# Patient Record
Sex: Female | Born: 1955 | Race: White | Hispanic: No | Smoking: Never smoker
Health system: Southern US, Community
[De-identification: ages and names within clinical notes are randomized; demographics above are authoritative.]

## PROBLEM LIST (undated history)

## (undated) DIAGNOSIS — H269 Unspecified cataract: Secondary | ICD-10-CM

## (undated) DIAGNOSIS — K649 Unspecified hemorrhoids: Secondary | ICD-10-CM

## (undated) DIAGNOSIS — T7840XA Allergy, unspecified, initial encounter: Secondary | ICD-10-CM

## (undated) DIAGNOSIS — F32A Depression, unspecified: Secondary | ICD-10-CM

## (undated) DIAGNOSIS — K219 Gastro-esophageal reflux disease without esophagitis: Secondary | ICD-10-CM

## (undated) DIAGNOSIS — E785 Hyperlipidemia, unspecified: Secondary | ICD-10-CM

## (undated) HISTORY — DX: Hyperlipidemia, unspecified: E78.5

## (undated) HISTORY — PX: FINGER SURGERY: SHX640

## (undated) HISTORY — PX: EYE SURGERY: SHX253

## (undated) HISTORY — DX: Unspecified hemorrhoids: K64.9

## (undated) HISTORY — PX: ABDOMINAL HYSTERECTOMY: SHX81

## (undated) HISTORY — PX: JOINT REPLACEMENT: SHX530

## (undated) HISTORY — PX: APPENDECTOMY: SHX54

## (undated) HISTORY — PX: TUBAL LIGATION: SHX77

## (undated) HISTORY — PX: CHOLECYSTECTOMY: SHX55

## (undated) HISTORY — PX: GALLBLADDER SURGERY: SHX652

## (undated) HISTORY — DX: Unspecified cataract: H26.9

## (undated) HISTORY — PX: TONSILLECTOMY: SUR1361

## (undated) HISTORY — DX: Allergy, unspecified, initial encounter: T78.40XA

---

## 2017-05-18 DIAGNOSIS — Z86010 Personal history of colonic polyps: Secondary | ICD-10-CM | POA: Insufficient documentation

## 2017-05-18 DIAGNOSIS — K2100 Gastro-esophageal reflux disease with esophagitis, without bleeding: Secondary | ICD-10-CM | POA: Insufficient documentation

## 2017-09-28 DIAGNOSIS — M25562 Pain in left knee: Secondary | ICD-10-CM | POA: Insufficient documentation

## 2019-12-13 DIAGNOSIS — Z780 Asymptomatic menopausal state: Secondary | ICD-10-CM | POA: Insufficient documentation

## 2020-01-02 DIAGNOSIS — R7303 Prediabetes: Secondary | ICD-10-CM | POA: Insufficient documentation

## 2020-01-02 DIAGNOSIS — E785 Hyperlipidemia, unspecified: Secondary | ICD-10-CM | POA: Insufficient documentation

## 2020-04-26 DIAGNOSIS — M8580 Other specified disorders of bone density and structure, unspecified site: Secondary | ICD-10-CM | POA: Insufficient documentation

## 2020-04-26 DIAGNOSIS — F439 Reaction to severe stress, unspecified: Secondary | ICD-10-CM | POA: Insufficient documentation

## 2020-09-06 DIAGNOSIS — B370 Candidal stomatitis: Secondary | ICD-10-CM | POA: Insufficient documentation

## 2020-09-06 DIAGNOSIS — J029 Acute pharyngitis, unspecified: Secondary | ICD-10-CM | POA: Insufficient documentation

## 2020-09-06 DIAGNOSIS — H6592 Unspecified nonsuppurative otitis media, left ear: Secondary | ICD-10-CM | POA: Insufficient documentation

## 2021-06-05 ENCOUNTER — Ambulatory Visit (INDEPENDENT_AMBULATORY_CARE_PROVIDER_SITE_OTHER): Payer: Medicare Other | Admitting: Medical-Surgical

## 2021-06-05 ENCOUNTER — Other Ambulatory Visit: Payer: Self-pay

## 2021-06-05 ENCOUNTER — Ambulatory Visit: Payer: Self-pay | Admitting: Medical-Surgical

## 2021-06-05 ENCOUNTER — Encounter: Payer: Self-pay | Admitting: Medical-Surgical

## 2021-06-05 VITALS — BP 132/79 | HR 77 | Resp 20 | Ht 67.0 in | Wt 200.0 lb

## 2021-06-05 DIAGNOSIS — F32A Depression, unspecified: Secondary | ICD-10-CM | POA: Insufficient documentation

## 2021-06-05 DIAGNOSIS — R400 Somnolence: Secondary | ICD-10-CM | POA: Diagnosis not present

## 2021-06-05 DIAGNOSIS — Z7689 Persons encountering health services in other specified circumstances: Secondary | ICD-10-CM | POA: Diagnosis not present

## 2021-06-05 DIAGNOSIS — Z131 Encounter for screening for diabetes mellitus: Secondary | ICD-10-CM

## 2021-06-05 DIAGNOSIS — E7801 Familial hypercholesterolemia: Secondary | ICD-10-CM

## 2021-06-05 DIAGNOSIS — M8580 Other specified disorders of bone density and structure, unspecified site: Secondary | ICD-10-CM

## 2021-06-05 DIAGNOSIS — F324 Major depressive disorder, single episode, in partial remission: Secondary | ICD-10-CM

## 2021-06-05 DIAGNOSIS — K219 Gastro-esophageal reflux disease without esophagitis: Secondary | ICD-10-CM

## 2021-06-05 DIAGNOSIS — Z1329 Encounter for screening for other suspected endocrine disorder: Secondary | ICD-10-CM

## 2021-06-05 DIAGNOSIS — R0683 Snoring: Secondary | ICD-10-CM

## 2021-06-05 MED ORDER — PANTOPRAZOLE SODIUM 40 MG PO TBEC: 40.0000 mg | DELAYED_RELEASE_TABLET | Freq: Every day | ORAL | 3 refills | Status: DC

## 2021-06-05 MED ORDER — DESVENLAFAXINE SUCCINATE ER 100 MG PO TB24: 100.0000 mg | ORAL_TABLET | Freq: Every day | ORAL | 3 refills | Status: DC

## 2021-06-05 MED ORDER — CETIRIZINE HCL 10 MG PO TABS: 10.0000 mg | ORAL_TABLET | Freq: Every day | ORAL | 3 refills | Status: DC

## 2021-06-05 MED ORDER — PRAVASTATIN SODIUM 20 MG PO TABS: 20.0000 mg | ORAL_TABLET | Freq: Every evening | ORAL | 3 refills | Status: DC

## 2021-06-05 NOTE — Progress Notes (Signed)
New Patient Office Visit  Subjective:  Patient ID: Rhonda Munoz, adult    DOB: Sep 22, 1955  Age: 65 y.o. MRN: [id number]  CC:  Chief Complaint  Patient presents with   Establish Care    HPI Rhonda Munoz presents to establish care.  Mood- taking Pristiq 100mg  daily, tolerating well without side effects. Been on this several years. Doing counseling but will start with a new counselor in a couple of weeks. 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 Living in a small 2 bedroom apartment but working on buying a townhouse.   GERD- taking Protonix 40mg  daily, tolerating well without side effects. Has a resurgence of symptoms if she doesn't take it.   Cholesterol- strong family history of high cholesterol and heart disease. Last couple of readings were borderline. Started Pravastatin 20mg  daily, tolerating well without side effects. Due for recheck.  Osteopenia- taking Calcium with vitamin D. Staying active. Last DEXA 2021.   History reviewed. No pertinent past medical history.  History reviewed. No pertinent surgical history.  History reviewed. No pertinent family history.  Social History   Socioeconomic History   Marital status: Married    Spouse name: Not on file   Number of children: Not on file   Years of education: Not on file   Highest education level: Not on file  Occupational History   Not on file  Tobacco Use   Smoking status: Not on file   Smokeless tobacco: Not on file  Substance and Sexual Activity   Alcohol use: Not on file   Drug use: Not on file   Sexual activity: Not on file  Other Topics Concern   Not on file  Social History Narrative   Not on file   Social Determinants of Health   Financial Resource Strain: Not on file  Food Insecurity: Not on file  Transportation Needs: Not on file  Physical Activity: Not on file  Stress: Not on file  Social Connections: Not on file   Intimate Partner Violence: Not on file    ROS Review of Systems  Constitutional:  Negative for chills, fatigue, fever and unexpected weight change.  Eyes:  Negative for visual disturbance.  Respiratory:  Negative for cough, chest tightness, shortness of breath and wheezing.   Cardiovascular:  Negative for chest pain, palpitations and leg swelling.  Gastrointestinal:  Negative for abdominal pain, constipation, diarrhea, nausea and vomiting.  Genitourinary:  Negative for dysuria, frequency and urgency.  Skin:  Negative for rash.  Neurological:  Negative for dizziness, light-headedness and headaches.  Psychiatric/Behavioral:  Positive for dysphoric mood and sleep disturbance. Negative for self-injury and suicidal ideas. The patient is nervous/anxious.    Objective:   Today's Vitals: BP 132/79 (BP Location: Left Arm, Patient Position: Sitting, Cuff Size: Normal)   Pulse 77   Resp 20   Ht 5\' 7"  (1.702 m)   Wt 200 lb (90.7 kg)   SpO2 99%   BMI 31.32 kg/m   Physical Exam Vitals and nursing note reviewed.  Constitutional:      General: She is not in acute distress.    Appearance: Normal appearance. She is obese. She is not ill-appearing.  HENT:     Head: Normocephalic and atraumatic.  Cardiovascular:     Rate and Rhythm: Normal rate and regular rhythm.     Pulses: Normal pulses.     Heart sounds: Normal heart sounds. No murmur heard.  No friction rub. No gallop.  Pulmonary:     Effort: Pulmonary effort is normal. No respiratory distress.     Breath sounds: Normal breath sounds.  Skin:    General: Skin is warm and dry.  Neurological:     Mental Status: She is alert and oriented to person, place, and time.  Psychiatric:        Mood and Affect: Mood normal.        Behavior: Behavior normal.        Thought Content: Thought content normal.        Judgment: Judgment normal.    Assessment & Plan:   1. Encounter to establish care Reviewed available information and discussed  care concerns with patient.   2. Gastroesophageal reflux disease without esophagitis Checking CBC with differential.  Continue Protonix 40 mg daily. - CBC with Differential/Platelet  3. Daytime sleepiness Daytime sleepiness may be related to sleep apnea versus stress versus post viral fatigue.  Checking CBC.  Home sleep test to evaluate for OSA - Home sleep test - CBC with Differential/Platelet  4. Major depressive disorder with single episode, in partial remission (HCC) Continue Pristiq 100 mg daily.  5. Osteopenia, unspecified location Continue vitamin D and calcium supplementation.  DEXA scan due next year.  6. Familial hypercholesterolemia Checking CMP and lipid panel.  Continue pravastatin. - COMPLETE METABOLIC PANEL WITH GFR - Lipid panel  7. Loud snoring Home sleep study. - Home sleep test  8. Thyroid disorder screen Checking TSH. - TSH  9. Diabetes mellitus screening Checking hemoglobin A1c. - Hemoglobin A1c  Outpatient Encounter Medications as of 06/05/2021  Medication Sig   aspirin 81 MG EC tablet Take 81 mg by mouth daily.   Calcium Carbonate-Vitamin D (CALCIUM 600 +D HIGH POTENCY PO) Take 600 mg by mouth daily.   [DISCONTINUED] cetirizine (ZYRTEC) 10 MG tablet Take 1 tablet by mouth daily.   [DISCONTINUED] desvenlafaxine (PRISTIQ) 100 MG 24 hr tablet Take 100 mg by mouth daily.   [DISCONTINUED] pantoprazole (PROTONIX) 40 MG tablet Take 1 tablet by mouth daily.   [DISCONTINUED] pravastatin (PRAVACHOL) 20 MG tablet Take 1 tablet by mouth every evening.   cetirizine (ZYRTEC) 10 MG tablet Take 1 tablet (10 mg total) by mouth daily.   desvenlafaxine (PRISTIQ) 100 MG 24 hr tablet Take 1 tablet (100 mg total) by mouth daily.   pantoprazole (PROTONIX) 40 MG tablet Take 1 tablet (40 mg total) by mouth daily.   pravastatin (PRAVACHOL) 20 MG tablet Take 1 tablet (20 mg total) by mouth every evening.   No facility-administered encounter medications on file as of  06/05/2021.    Follow-up: Return in about 6 months (around 12/04/2021) for chronic disease follow up.   Thayer Ohm, DNP, APRN, FNP-BC [facility]

## 2021-06-19 ENCOUNTER — Ambulatory Visit: Payer: Medicare Other

## 2021-07-01 ENCOUNTER — Ambulatory Visit (INDEPENDENT_AMBULATORY_CARE_PROVIDER_SITE_OTHER): Payer: Medicare Other | Admitting: Medical-Surgical

## 2021-07-01 ENCOUNTER — Other Ambulatory Visit: Payer: Self-pay

## 2021-07-01 DIAGNOSIS — Z23 Encounter for immunization: Secondary | ICD-10-CM | POA: Diagnosis not present

## 2021-07-10 ENCOUNTER — Telehealth: Payer: Self-pay | Admitting: Medical-Surgical

## 2021-07-10 ENCOUNTER — Ambulatory Visit: Payer: Medicare Other | Admitting: Physician Assistant

## 2021-07-10 DIAGNOSIS — R300 Dysuria: Secondary | ICD-10-CM

## 2021-07-10 NOTE — Telephone Encounter (Signed)
Ok to take off since she called.

## 2021-07-10 NOTE — Telephone Encounter (Signed)
Patient called at 8:01am to cancel appointment, woke up with bad vertigo. Didn't know rather to leave her on the schedule or cancel her off?

## 2021-07-11 ENCOUNTER — Telehealth (INDEPENDENT_AMBULATORY_CARE_PROVIDER_SITE_OTHER): Payer: Medicare Other | Admitting: Family Medicine

## 2021-07-11 DIAGNOSIS — N3000 Acute cystitis without hematuria: Secondary | ICD-10-CM

## 2021-07-11 MED ORDER — FLUCONAZOLE 150 MG PO TABS: 150.0000 mg | ORAL_TABLET | Freq: Once | ORAL | 0 refills | Status: AC

## 2021-07-11 MED ORDER — SULFAMETHOXAZOLE-TRIMETHOPRIM 800-160 MG PO TABS: 1.0000 | ORAL_TABLET | Freq: Two times a day (BID) | ORAL | 0 refills | Status: DC

## 2021-07-11 NOTE — Progress Notes (Signed)
    Virtual Visit via Video Note  I connected with Rhonda Munoz on 07/11/21 at 11:10 AM EST by a video enabled telemedicine application and verified that I am speaking with the correct person using two identifiers.   I discussed the limitations of evaluation and management by telemedicine and the availability of in person appointments. The patient expressed understanding and agreed to proceed.  Patient location: at home Provider location: in office  Subjective:    CC:  No chief complaint on file.   HPI: Sxs started about 4 days ago of pelvic pressure, no severe.  Having urgency and pressure at the same time.  No burning with urinary  Hx of complete hysterecty. No recent ABX. No recent catheter use.  . No hx of frequent UTI   Past medical history, Surgical history, Family history not pertinant except as noted below, Social history, Allergies, and medications have been entered into the medical record, reviewed, and corrections made.    Objective:    General: Speaking clearly in complete sentences without any shortness of breath.  Alert and oriented x3.  Normal judgment. No apparent acute distress.    Impression and Recommendations:    Problem List Items Addressed This Visit   None   No orders of the defined types were placed in this encounter.   No orders of the defined types were placed in this encounter.    I discussed the assessment and treatment plan with the patient. The patient was provided an opportunity to ask questions and all were answered. The patient agreed with the plan and demonstrated an understanding of the instructions.   The patient was advised to call back or seek an in-person evaluation if the symptoms worsen or if the condition fails to improve as anticipated.   Nani Gasser, MD

## 2021-07-11 NOTE — Progress Notes (Signed)
    Virtual Visit via Video Note  I connected with [name] on 07/11/21 at 11:10 AM EST by a video enabled telemedicine application and verified that I am speaking with the correct person using two identifiers.   I discussed the limitations of evaluation and management by telemedicine and the availability of in person appointments. The patient expressed understanding and agreed to proceed.  Patient location: at home Provider location: in office  Subjective:    CC:   Chief Complaint  Patient presents with   Urinary Tract Infection     HPI: Sxs started about 4 days ago of pelvic pressure, no severe.  Having urgency and pressure at the same time.  No burning with urinary  Hx of complete hysterecty. No recent ABX. No recent catheter use.  . No hx of frequent UTI. No blood in the urine.  Used the AZO test strip and looked positive.  No nausea or back pain.    Past medical history, Surgical history, Family history not pertinant except as noted below, Social history, Allergies, and medications have been entered into the medical record, reviewed, and corrections made.    Objective:    General: Speaking clearly in complete sentences without any shortness of breath.  Alert and oriented x3.  Normal judgment. No apparent acute distress.    Impression and Recommendations:    Problem List Items Addressed This Visit   None Visit Diagnoses     Acute cystitis without hematuria    -  Primary       No orders of the defined types were placed in this encounter.   Will tx with Bactrim DS based on sxs.  Call to be seen if not better after the weekend. She is low risk for more complicated UTI.  Sent over diflucan as well. She will use her acidophilis as she is very prone to yeast after coarse of ABX.   Meds ordered this encounter  Medications   sulfamethoxazole-trimethoprim (BACTRIM DS) 800-160 MG tablet    Sig: Take 1 tablet by mouth 2 (two) times daily.    Dispense:  6 tablet     Refill:  0   fluconazole (DIFLUCAN) 150 MG tablet    Sig: Take 1 tablet (150 mg total) by mouth once for 1 dose.    Dispense:  1 tablet    Refill:  0     I discussed the assessment and treatment plan with the patient. The patient was provided an opportunity to ask questions and all were answered. The patient agreed with the plan and demonstrated an understanding of the instructions.   The patient was advised to call back or seek an in-person evaluation if the symptoms worsen or if the condition fails to improve as anticipated.   Nani Gasser, MD

## 2021-07-11 NOTE — Progress Notes (Signed)
Sxs since Monday. She reports pain in lower abd w/urination and odor urgency, no blood. She has tested with the AZO it shows leuks/nitrites

## 2021-07-12 ENCOUNTER — Ambulatory Visit: Payer: Self-pay | Admitting: Medical-Surgical

## 2021-08-06 LAB — CBC WITH DIFFERENTIAL/PLATELET
Absolute Monocytes: 461 cells/uL (ref 200–950)
Basophils Absolute: 47 cells/uL (ref 0–200)
Basophils Relative: 0.5 %
Eosinophils Absolute: 461 cells/uL (ref 15–500)
Eosinophils Relative: 4.9 %
HCT: 40.5 % (ref 38.5–50.0)
Hemoglobin: 13.1 g/dL — ABNORMAL LOW (ref 13.2–17.1)
Lymphs Abs: 2688 cells/uL (ref 850–3900)
MCH: 28 pg (ref 27.0–33.0)
MCHC: 32.3 g/dL (ref 32.0–36.0)
MCV: 86.5 fL (ref 80.0–100.0)
MPV: 10.5 fL (ref 7.5–12.5)
Monocytes Relative: 4.9 %
Neutro Abs: 5743 cells/uL (ref 1500–7800)
Neutrophils Relative %: 61.1 %
Platelets: 334 10*3/uL (ref 140–400)
RBC: 4.68 10*6/uL (ref 4.20–5.80)
RDW: 13.1 % (ref 11.0–15.0)
Total Lymphocyte: 28.6 %
WBC: 9.4 10*3/uL (ref 3.8–10.8)

## 2021-08-06 LAB — COMPLETE METABOLIC PANEL WITH GFR
AG Ratio: 1.4 (calc) (ref 1.0–2.5)
ALT: 21 U/L (ref 9–46)
AST: 19 U/L (ref 10–35)
Albumin: 4.4 g/dL (ref 3.6–5.1)
Alkaline phosphatase (APISO): 86 U/L (ref 35–144)
BUN: 10 mg/dL (ref 7–25)
CO2: 27 mmol/L (ref 20–32)
Calcium: 9.7 mg/dL (ref 8.6–10.3)
Chloride: 101 mmol/L (ref 98–110)
Creat: 0.7 mg/dL (ref 0.70–1.35)
Globulin: 3.2 g/dL (calc) (ref 1.9–3.7)
Glucose, Bld: 91 mg/dL (ref 65–99)
Potassium: 4.5 mmol/L (ref 3.5–5.3)
Sodium: 140 mmol/L (ref 135–146)
Total Bilirubin: 0.4 mg/dL (ref 0.2–1.2)
Total Protein: 7.6 g/dL (ref 6.1–8.1)
eGFR: 104 mL/min/{1.73_m2} (ref >=60)

## 2021-08-06 LAB — LIPID PANEL
Cholesterol: 182 mg/dL (ref <200)
HDL: 56 mg/dL (ref >=40)
LDL Cholesterol (Calc): 99 mg/dL (calc)
Non-HDL Cholesterol (Calc): 126 mg/dL (calc) (ref <130)
Total CHOL/HDL Ratio: 3.3 (calc) (ref <5.0)
Triglycerides: 174 mg/dL — ABNORMAL HIGH (ref <150)

## 2021-08-06 LAB — HEMOGLOBIN A1C
Hgb A1c MFr Bld: 6.1 % of total Hgb — ABNORMAL HIGH (ref <5.7)
Mean Plasma Glucose: 128 mg/dL
eAG (mmol/L): 7.1 mmol/L

## 2021-08-06 LAB — TSH: TSH: 1.99 mIU/L (ref 0.40–4.50)

## 2021-08-12 ENCOUNTER — Encounter: Payer: Self-pay | Admitting: Family Medicine

## 2021-08-12 ENCOUNTER — Telehealth (INDEPENDENT_AMBULATORY_CARE_PROVIDER_SITE_OTHER): Payer: Medicare Other | Admitting: Family Medicine

## 2021-08-12 VITALS — Temp 100.8°F | Wt 194.0 lb

## 2021-08-12 DIAGNOSIS — U071 COVID-19: Secondary | ICD-10-CM

## 2021-08-12 MED ORDER — NIRMATRELVIR/RITONAVIR (PAXLOVID)TABLET: ORAL_TABLET | ORAL | 0 refills | Status: DC

## 2021-08-12 NOTE — Progress Notes (Signed)
Virtual Video Visit via MyChart Note  I connected with  [name] on 08/12/21 at  2:20 PM EST by the video enabled telemedicine application for MyChart, and verified that I am speaking with the correct person using two identifiers.   I introduced myself as a Publishing rights manager with the practice. We discussed the limitations of evaluation and management by telemedicine and the availability of in person appointments. The patient expressed understanding and agreed to proceed.  Participating parties in this visit include: The patient and the nurse practitioner listed.  The patient is: At home I am: In the office - [facility]  Subjective:    CC:  Chief Complaint  Patient presents with   Covid Positive    Body aches, drainage, headaches. Taking OTC pain medications.    HPI: Rhonda Munoz is a 65 y.o. year old female presenting today via MyChart today for COVID infection.   Two days ago patient started with rhinorrhea that has now progressed to nasal congestion, headache, sinus pressure, fever/chills, generalized body aches, fatigue. She had a positive COVID test today and states she feels horrible. She has gotten minimal relief with Coricidin, Tylenol, and hot showers. She denies any chest pain, shortness of breath, pain with breathing, wheezing, nausea, vomiting, diarrhea.      Past medical history, Surgical history, Family history not pertinant except as noted below, Social history, Allergies, and medications have been entered into the medical record, reviewed, and corrections made.   Review of Systems:  All review of systems negative except what is listed in the HPI   Objective:    General:  Speaking clearly in complete sentences. Absent shortness of breath noted.   Alert and oriented x3.   Normal judgment.  Absent acute distress.   Impression and Recommendations:    1. COVID-19 - nirmatrelvir/ritonavir EUA (PAXLOVID) 20 x 150 MG & 10 x 100MG  TABS; 1  dose p.o. twice daily for 5 days, may use any available formulation at pharmacy with the same total dosage.  Dispense: 30 tablet; Refill: 0  Discussed options with patient. She would like to try Paxlovid. Education sheet link attached to AVS. Educated her to hold her statin while on this medicine. Adding list of OTC medications and quarantine guidelines to AVS. Patient aware of signs/symptoms requiring further/urgent evaluation.    Follow-up if symptoms worsen or fail to improve.    I discussed the assessment and treatment plan with the patient. The patient was provided an opportunity to ask questions and all were answered. The patient agreed with the plan and demonstrated an understanding of the instructions.   The patient was advised to call back or seek an in-person evaluation if the symptoms worsen or if the condition fails to improve as anticipated.  I spent 20 minutes dedicated to the care of this patient on the date of this encounter to include pre-visit chart review of prior notes and results, face-to-face time with the patient, and post-visit ordering of testing as indicated.   , NP

## 2021-08-12 NOTE — Patient Instructions (Addendum)
Paxlovid patient fact sheet: http://galloway.com/  Skip your cholesterol medicine while on the paxlovid.   Over the counter medications that may be helpful for symptoms:  Guaifenesin 1200 mg extended release tabs twice daily, with plenty of water For cough and congestion Brand name: Mucinex   Pseudoephedrine 30 mg, one or two tabs every 4 to 6 hours For sinus congestion Brand name: Sudafed You must get this from the pharmacy counter.  Oxymetazoline nasal spray each morning, one spray in each nostril, for NO MORE THAN 3 days  For nasal and sinus congestion Brand name: Afrin Saline nasal spray or Saline Nasal Irrigation 3-5 times a day For nasal and sinus congestion Brand names: Ocean or AYR Fluticasone nasal spray, one spray in each nostril, each morning (after oxymetazoline and saline, if used) For nasal and sinus congestion Brand name: Flonase Warm salt water gargles  For sore throat Every few hours as needed Alternate ibuprofen 400-600 mg and acetaminophen 1000 mg every 4-6 hours For fever, body aches, headache Brand names: Motrin or Advil and Tylenol Dextromethorphan 12-hour cough version 30 mg every 12 hours  For cough Brand name: Delsym Stop all other cold medications for now (Nyquil, Dayquil, Tylenol Cold, Theraflu, etc) and other non-prescription cough/cold preparations. Many of these have the same ingredients listed above and could cause an overdose of medication.   Herbal treatments that have been shown to be helpful in some patients include: Vitamin C 1000mg  per day Vitamin D 4000iU per day Zinc 100mg  per day Quercetin 25-500mg  twice a day Melatonin 5-10mg  at bedtime  General Instructions Allow your body to rest Drink PLENTY of fluids Isolate yourself from everyone, even family, until test results have returned  If your COVID-19 test is positive Then you ARE INFECTED and you can pass the virus to others You must quarantine from others for  a minimum of  5 days since symptoms started AND You are fever free for 24 hours WITHOUT any medication to reduce fever AND Your symptoms are improving Wear mask for the next 5 days If not improved by day 5, quarantine for the full 10 days. Do not go to the store or other public areas Do not go around household members who are not known to be infected with COVID-19 If you MUST leave your area of quarantine (example: go to a bathroom you share with others in your home), you must Wear a mask Wash your hands thoroughly Wipe down any surfaces you touch Do not share food, drinks, towels, or other items with other persons Dispose of your own tissues, food containers, etc  Once you have recovered, please continue good preventive care measures, including:  wearing a mask when in public wash your hands frequently avoid touching your face/nose/eyes cover coughs/sneezes with the inside of your elbow stay out of crowds keep a 6 foot distance from others  If you develop severe shortness of breath, uncontrolled fevers, coughing up blood, confusion, chest pain, or signs of dehydration (such as significantly decreased urine amounts or dizziness with standing) please go to the ER.

## 2021-10-12 ENCOUNTER — Other Ambulatory Visit: Payer: Self-pay

## 2021-10-12 ENCOUNTER — Emergency Department
Admission: RE | Admit: 2021-10-12 | Discharge: 2021-10-12 | Disposition: A | Discharge status: Home / Self Care | Payer: Medicare Other | Source: Ambulatory Visit

## 2021-10-12 VITALS — BP 148/88 | HR 73 | Temp 98.3°F | Resp 18 | Ht 67.0 in | Wt 200.0 lb

## 2021-10-12 DIAGNOSIS — J0101 Acute recurrent maxillary sinusitis: Secondary | ICD-10-CM

## 2021-10-12 MED ORDER — AMOXICILLIN-POT CLAVULANATE 875-125 MG PO TABS: 1.0000 | ORAL_TABLET | Freq: Two times a day (BID) | ORAL | 0 refills | Status: DC

## 2021-10-12 MED ORDER — FLUCONAZOLE 150 MG PO TABS: 150.0000 mg | ORAL_TABLET | Freq: Every day | ORAL | 0 refills | Status: DC

## 2021-10-12 MED ORDER — PREDNISONE 20 MG PO TABS: 20.0000 mg | ORAL_TABLET | Freq: Two times a day (BID) | ORAL | 0 refills | Status: DC

## 2021-10-12 NOTE — Discharge Instructions (Signed)
Take the Augmentin 2 times a day with food Take a probiotic while you are on the Augmentin to prevent side effects I have prescribed Diflucan in case she needed Take the prednisone 2 times a day for 5 days. Take the Augmentin and the prednisone 2 times today, then tomorrow start every 12 hours Increase water intake Salt water or saline sinus rinses recommended

## 2021-10-12 NOTE — ED Triage Notes (Signed)
Pt states that she has some nasal congestion, cough, sneezing,  and facial pain. X10 days  Pt states that she is vaccinated for covid. Pt states that she has had flu vaccine.  Pt states that she has had covid in 07/2021.

## 2021-10-12 NOTE — ED Provider Notes (Signed)
[facility]    CSN: [id number] Arrival date & time: 10/12/21  1055      History   Chief Complaint Chief Complaint  Patient presents with   Nasal Congestion    Nasal congestion, sneezing, and coughing. X10 days    HPI Rhonda Munoz is a 66 y.o. female.   HPI Patient has had nasal congestion, cough, sneezing, and some facial pressure for the last 10 days.  Last 2 days with facial pain is localized to the left side of her face.  Her left forehead and left cheek are very painful, it hurts if she leans over.  She is having yellow sinus drainage.  Some nausea.  Some headache. Patient just recovered from COVID 6 weeks ago.  She is vaccinated  Past Medical History:  Diagnosis Date   Hyperlipidemia     Patient Active Problem List   Diagnosis Date Noted   Depression 06/05/2021   Left non-suppurative otitis media 09/06/2020   Oral candidiasis 09/06/2020   Pharyngitis 09/06/2020   Osteopenia 04/26/2020   Stress at home 04/26/2020   Hypercalcemia 01/02/2020   Hyperlipemia 01/02/2020   Prediabetes 01/02/2020   Postmenopausal 12/13/2019   Knee pain, left 09/28/2017   Gastroesophageal reflux disease with esophagitis 05/18/2017   History of colon polyps 05/18/2017    Past Surgical History:  Procedure Laterality Date   ABDOMINAL HYSTERECTOMY     FINGER SURGERY     GALLBLADDER SURGERY      OB History   No obstetric history on file.      Home Medications    Prior to Admission medications   Medication Sig Start Date End Date Taking? Authorizing Provider  amoxicillin-clavulanate (AUGMENTIN) 875-125 MG tablet Take 1 tablet by mouth every 12 (twelve) hours. 10/12/21  Yes Eustace Moore, MD  aspirin 81 MG EC tablet Take 81 mg by mouth daily.   Yes [provider]  B Complex Vitamins (B COMPLEX-B12 PO) Take by mouth.   Yes [provider]  Calcium Carbonate-Vitamin D (CALCIUM 600 +D HIGH POTENCY PO) Take 600 mg by mouth daily.   Yes [provider]  desvenlafaxine (PRISTIQ) 100 MG 24 hr tablet Take 1 tablet (100 mg total) by mouth daily. 06/05/21  Yes Christen Butter, NP  fluconazole (DIFLUCAN) 150 MG tablet Take 1 tablet (150 mg total) by mouth daily. Repeat in 1 week if needed 10/12/21  Yes Eustace Moore, MD  pantoprazole (PROTONIX) 40 MG tablet Take 1 tablet (40 mg total) by mouth daily. 06/05/21  Yes Christen Butter, NP  pravastatin (PRAVACHOL) 20 MG tablet Take 1 tablet (20 mg total) by mouth every evening. 06/05/21  Yes Christen Butter, NP  predniSONE (DELTASONE) 20 MG tablet Take 1 tablet (20 mg total) by mouth 2 (two) times daily with a meal. 10/12/21  Yes Eustace Moore, MD    Family History Family History  Problem Relation Age of Onset   Cancer Mother    Hypertension Mother    Stroke Mother    Diabetes Father    Heart attack Father     Social History Social History   Tobacco Use   Smoking status: Never   Smokeless tobacco: Never  Vaping Use   Vaping Use: Never used  Substance Use Topics   Alcohol use: Never   Drug use: Never     Allergies   Codeine, Hydromorphone, Procaine, and Venlafaxine   Review of Systems Review of Systems See HPI  Physical Exam Triage Vital Signs ED  Triage Vitals  Enc Vitals Group     BP 10/12/21 1116 (!) 148/88     Pulse Rate 10/12/21 1116 73     Resp 10/12/21 1116 18     Temp 10/12/21 1116 98.3 F (36.8 C)     Temp Source 10/12/21 1116 Oral     SpO2 10/12/21 1116 99 %     Weight 10/12/21 1113 200 lb (90.7 kg)     Height 10/12/21 1113 5\' 7"  (1.702 m)     Head Circumference --      Peak Flow --      Pain Score 10/12/21 1113 0     Pain Loc --      Pain Edu? --      Excl. in GC? --    No data found.  Updated Vital Signs BP (!) 148/88 (BP Location: Right Arm)    Pulse 73    Temp 98.3 F (36.8 C) (Oral)    Resp 18    Ht 5\' 7"  (1.702 m)    Wt 90.7 kg    SpO2 99%    BMI 31.32 kg/m      Physical Exam Constitutional:      General: She is not in acute  distress.    Appearance: Normal appearance. She is well-developed.  HENT:     Head: Normocephalic and atraumatic.     Right Ear: Tympanic membrane and ear canal normal.     Left Ear: Tympanic membrane and ear canal normal.     Nose: Congestion and rhinorrhea present.     Mouth/Throat:     Pharynx: Posterior oropharyngeal erythema present.     Comments: Tenderness in left facial sinuses Eyes:     Conjunctiva/sclera: Conjunctivae normal.     Pupils: Pupils are equal, round, and reactive to light.  Cardiovascular:     Rate and Rhythm: Normal rate and regular rhythm.     Heart sounds: Normal heart sounds.  Pulmonary:     Effort: Pulmonary effort is normal. No respiratory distress.     Breath sounds: Normal breath sounds.  Abdominal:     General: There is no distension.     Palpations: Abdomen is soft.  Musculoskeletal:        General: Normal range of motion.     Cervical back: Normal range of motion.  Lymphadenopathy:     Cervical: Cervical adenopathy present.  Skin:    General: Skin is warm and dry.  Neurological:     General: No focal deficit present.     Mental Status: She is alert.     UC Treatments / Results  Labs (all labs ordered are listed, but only abnormal results are displayed) Labs Reviewed - No data to display  EKG   Radiology No results found.  Procedures Procedures (including critical care time)  Medications Ordered in UC Medications - No data to display  Initial Impression / Assessment and Plan / UC Course  I have reviewed the triage vital signs and the nursing notes.  Pertinent labs & imaging results that were available during my care of the patient were reviewed by me and considered in my medical decision making (see chart for details).   Likely bacterial infection after 10 days of viral, will treat with Augmentin Final Clinical Impressions(s) / UC Diagnoses   Final diagnoses:  Acute recurrent maxillary sinusitis     Discharge  Instructions      Take the Augmentin 2 times a day with food Take a probiotic  while you are on the Augmentin to prevent side effects I have prescribed Diflucan in case she needed Take the prednisone 2 times a day for 5 days. Take the Augmentin and the prednisone 2 times today, then tomorrow start every 12 hours Increase water intake Salt water or saline sinus rinses recommended   ED Prescriptions     Medication Sig Dispense Auth. Provider   amoxicillin-clavulanate (AUGMENTIN) 875-125 MG tablet Take 1 tablet by mouth every 12 (twelve) hours. 14 tablet Eustace Moore, MD   predniSONE (DELTASONE) 20 MG tablet Take 1 tablet (20 mg total) by mouth 2 (two) times daily with a meal. 10 tablet Eustace Moore, MD   fluconazole (DIFLUCAN) 150 MG tablet Take 1 tablet (150 mg total) by mouth daily. Repeat in 1 week if needed 2 tablet Eustace Moore, MD      PDMP not reviewed this encounter.   Eustace Moore, MD 10/12/21 [phone]

## 2021-10-18 ENCOUNTER — Emergency Department (HOSPITAL_BASED_OUTPATIENT_CLINIC_OR_DEPARTMENT_OTHER): Payer: Medicare Other | Admitting: Radiology

## 2021-10-18 ENCOUNTER — Emergency Department (HOSPITAL_BASED_OUTPATIENT_CLINIC_OR_DEPARTMENT_OTHER)
Admission: EM | Admit: 2021-10-18 | Discharge: 2021-10-18 | Disposition: A | Discharge status: Home / Self Care | Payer: Medicare Other | Attending: Emergency Medicine | Admitting: Emergency Medicine

## 2021-10-18 ENCOUNTER — Ambulatory Visit: Payer: Medicare Other | Admitting: Family Medicine

## 2021-10-18 ENCOUNTER — Encounter (HOSPITAL_BASED_OUTPATIENT_CLINIC_OR_DEPARTMENT_OTHER): Payer: Self-pay | Admitting: *Deleted

## 2021-10-18 ENCOUNTER — Other Ambulatory Visit: Payer: Self-pay

## 2021-10-18 DIAGNOSIS — Z7982 Long term (current) use of aspirin: Secondary | ICD-10-CM | POA: Insufficient documentation

## 2021-10-18 DIAGNOSIS — H9201 Otalgia, right ear: Secondary | ICD-10-CM | POA: Insufficient documentation

## 2021-10-18 DIAGNOSIS — J069 Acute upper respiratory infection, unspecified: Secondary | ICD-10-CM | POA: Diagnosis not present

## 2021-10-18 DIAGNOSIS — Z8616 Personal history of COVID-19: Secondary | ICD-10-CM | POA: Diagnosis not present

## 2021-10-18 DIAGNOSIS — Z20822 Contact with and (suspected) exposure to covid-19: Secondary | ICD-10-CM | POA: Diagnosis not present

## 2021-10-18 DIAGNOSIS — R059 Cough, unspecified: Secondary | ICD-10-CM | POA: Diagnosis present

## 2021-10-18 LAB — RESP PANEL BY RT-PCR (FLU A&B, COVID) ARPGX2
Influenza A by PCR: NEGATIVE
Influenza B by PCR: NEGATIVE
SARS Coronavirus 2 by RT PCR: NEGATIVE

## 2021-10-18 MED ORDER — BENZONATATE 100 MG PO CAPS: 100.0000 mg | ORAL_CAPSULE | Freq: Three times a day (TID) | ORAL | 0 refills | Status: DC

## 2021-10-18 MED ORDER — BENZONATATE 100 MG PO CAPS
100.0000 mg | ORAL_CAPSULE | Freq: Once | ORAL | Status: AC
  Administered 2021-10-18: 100 mg via ORAL
  Filled 2021-10-18: qty 1

## 2021-10-18 NOTE — ED Triage Notes (Signed)
Pt began having URI on 2/8 Was seen on 2/18 and placed on augmentin for sinus infection.   Pt having lots of coughing since yesterday and states that she feels like she has croup like coughing.

## 2021-10-18 NOTE — ED Notes (Signed)
ED Provider at bedside. 

## 2021-10-18 NOTE — ED Provider Notes (Signed)
[facility] EMERGENCY DEPT Provider Note   CSN: [id number] Arrival date & time: 10/18/21  1301     History  Chief Complaint  Patient presents with   URI    Kariyah Crest is a 66 y.o. female.  Patient is a 66 year old female with a history of hyperlipidemia who is presenting today with complaint of cough.  Patient reports that she had had URI symptoms with significant sinusitis, drainage from her nose, ear pain last week that had been ongoing for approximately 10 days.  She was seen at urgent care 6 days ago and at that time was given Augmentin.  She never filled the prednisone prescription but reports that she had been doing better and felt like she was getting better until yesterday she started coughing.  Now she has a severe cough that makes it difficult to sleep at night.  She has not noticed any wheezing or had any shortness of breath.  She denies any fever.  Her husband is also ill at this time with similar symptoms.  She has no history of tobacco use or asthma.  She denies any lower extremity swelling.  She did have COVID in December and reported a lot of coughing with that but never had to use an inhaler.  The history is provided by the patient.  URI     Home Medications Prior to Admission medications   Medication Sig Start Date End Date Taking? Authorizing Provider  amoxicillin-clavulanate (AUGMENTIN) 875-125 MG tablet Take 1 tablet by mouth every 12 (twelve) hours. 10/12/21   Eustace Moore, MD  aspirin 81 MG EC tablet Take 81 mg by mouth daily.    [provider]  B Complex Vitamins (B COMPLEX-B12 PO) Take by mouth.    [provider]  Calcium Carbonate-Vitamin D (CALCIUM 600 +D HIGH POTENCY PO) Take 600 mg by mouth daily.    [provider]  desvenlafaxine (PRISTIQ) 100 MG 24 hr tablet Take 1 tablet (100 mg total) by mouth daily. 06/05/21   Christen Butter, NP  fluconazole (DIFLUCAN) 150 MG tablet Take 1 tablet (150 mg total) by  mouth daily. Repeat in 1 week if needed 10/12/21   Eustace Moore, MD  pantoprazole (PROTONIX) 40 MG tablet Take 1 tablet (40 mg total) by mouth daily. 06/05/21   Christen Butter, NP  pravastatin (PRAVACHOL) 20 MG tablet Take 1 tablet (20 mg total) by mouth every evening. 06/05/21   Christen Butter, NP  predniSONE (DELTASONE) 20 MG tablet Take 1 tablet (20 mg total) by mouth 2 (two) times daily with a meal. 10/12/21   Eustace Moore, MD      Allergies    Codeine, Hydromorphone, Procaine, and Venlafaxine    Review of Systems   Review of Systems  Physical Exam Updated Vital Signs BP 125/90 (BP Location: Right Arm)    Pulse (!) 103    Temp 98.9 F (37.2 C)    Resp 18    SpO2 100%  Physical Exam Vitals and nursing note reviewed.  Constitutional:      General: She is not in acute distress.    Appearance: She is well-developed.  HENT:     Head: Normocephalic and atraumatic.     Right Ear: A middle ear effusion is present.     Left Ear: Tympanic membrane normal.     Nose: Nose normal.     Mouth/Throat:     Pharynx: Oropharynx is clear. Uvula midline.  Eyes:     Conjunctiva/sclera: Conjunctivae  normal.     Pupils: Pupils are equal, round, and reactive to light.  Cardiovascular:     Rate and Rhythm: Normal rate and regular rhythm.     Heart sounds: No murmur heard. Pulmonary:     Effort: Pulmonary effort is normal. No respiratory distress.     Breath sounds: Normal breath sounds. No wheezing or rales.  Musculoskeletal:        General: No tenderness. Normal range of motion.     Cervical back: Normal range of motion and neck supple.  Skin:    General: Skin is warm and dry.     Findings: No erythema or rash.  Neurological:     Mental Status: She is alert and oriented to person, place, and time.  Psychiatric:        Behavior: Behavior normal.    ED Results / Procedures / Treatments   Labs (all labs ordered are listed, but only abnormal results are displayed) Labs Reviewed  RESP  PANEL BY RT-PCR (FLU A&B, COVID) ARPGX2    EKG None  Radiology DG Chest 2 View  Result Date: 10/18/2021 CLINICAL DATA:  cough EXAM: CHEST - 2 VIEW COMPARISON:  None. FINDINGS: Subtle linear left basilar opacity. No visible pleural effusions or pneumothorax. Cardiomediastinal silhouette is within normal limits. No evidence of acute osseous abnormality. Degenerative change of the spine. Cholecystectomy clips. IMPRESSION: Subtle linear left basilar opacity, favor atelectasis. Electronically Signed   By: Feliberto Harts M.D.   On: 10/18/2021 13:53    Procedures Procedures    Medications Ordered in ED Medications - No data to display  ED Course/ Medical Decision Making/ A&P                           Medical Decision Making Amount and/or Complexity of Data Reviewed Radiology: ordered.  Risk Prescription drug management.   Patient is a 66 year old female presenting today with cough that started over the last 2 days.  She has had URI type symptoms for over a week now.  She is completing Augmentin which she received last week at an urgent care visit when she was having significant sinusitis type symptoms for 10+ days.  Records of that evaluation were reviewed from external medical records.  Patient is now having a cough but denies any shortness of breath.  She has no prior history of respiratory abnormalities such as asthma or COPD.  She does not use tobacco products.  She reports having COVID in December but even then did not require inhaler.  She is not wheezing here, satting 100% on room air and has no evidence of tachypnea.  I independently viewed and interpreted her chest x-ray which appears normal.  Radiology did report a subtle linear left basilar upon opacity which they feel is atelectasis.  Patient has not had shortness of breath or fever.  Low suspicion for PE or acute coronary issues.  COVID and flu are negative.  Reassured patient with the negative findings.  She will continue  Mucinex and was offered [employer].  Findings discussed with the patient.  No social determinants affecting her care.        Final Clinical Impression(s) / ED Diagnoses Final diagnoses:  Viral upper respiratory tract infection    Rx / DC Orders ED Discharge Orders          Ordered    benzonatate (TESSALON) 100 MG capsule  Every 8 hours  10/18/21 1507              Gwyneth Sprout, MD 10/18/21 1524

## 2021-10-18 NOTE — Discharge Instructions (Signed)
Try honey for the cough and hot tea.  Get some rest.

## 2021-10-18 NOTE — ED Notes (Signed)
EMT-P provided AVS using Teachback Method. Patient verbalizes understanding of Discharge Instructions. Opportunity for Questioning and Answers were provided by EMT-P. Patient Discharged from ED.  ? ?

## 2021-10-21 ENCOUNTER — Telehealth: Payer: Self-pay | Admitting: General Practice

## 2021-10-21 NOTE — Telephone Encounter (Signed)
Transition Care Management Follow-up Telephone Call Date of discharge and from where: 10/18/21 from [facility]  How have you been since you were released from the hospital? Still coughing a lot. 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 She is covid negative.  Any questions or concerns? No  Items Reviewed: Did the pt receive and understand the discharge instructions provided? Yes  Medications obtained and verified? Yes  Other? No  Any new allergies since your discharge? No  Dietary orders reviewed? Yes Do you have support at home? Yes   Home Care and Equipment/Supplies: Were home health services ordered? no  Functional Questionnaire: (I = Independent and D = Dependent) ADLs: I  Bathing/Dressing- I  Meal Prep- I  Eating- I  Maintaining continence- I  Transferring/Ambulation- I  Managing Meds- I  Follow up appointments reviewed:  PCP Hospital f/u appt confirmed? No  Will call back to schedule. Specialist Hospital f/u appt confirmed? No   Are transportation arrangements needed? No  If their condition worsens, is the pt aware to call PCP or go to the Emergency Dept.? Yes Was the patient provided with contact information for the PCP's office or ED? Yes Was to pt encouraged to call back with questions or concerns? Yes

## 2021-10-23 ENCOUNTER — Encounter (HOSPITAL_BASED_OUTPATIENT_CLINIC_OR_DEPARTMENT_OTHER): Payer: Self-pay | Admitting: Pediatrics

## 2021-10-23 ENCOUNTER — Emergency Department (HOSPITAL_BASED_OUTPATIENT_CLINIC_OR_DEPARTMENT_OTHER): Payer: Medicare Other | Admitting: Radiology

## 2021-10-23 ENCOUNTER — Other Ambulatory Visit (HOSPITAL_BASED_OUTPATIENT_CLINIC_OR_DEPARTMENT_OTHER): Payer: Self-pay

## 2021-10-23 ENCOUNTER — Other Ambulatory Visit: Payer: Self-pay

## 2021-10-23 ENCOUNTER — Emergency Department (HOSPITAL_BASED_OUTPATIENT_CLINIC_OR_DEPARTMENT_OTHER)
Admission: EM | Admit: 2021-10-23 | Discharge: 2021-10-23 | Disposition: A | Discharge status: Home / Self Care | Payer: Medicare Other | Attending: Emergency Medicine | Admitting: Emergency Medicine

## 2021-10-23 DIAGNOSIS — Z7982 Long term (current) use of aspirin: Secondary | ICD-10-CM | POA: Diagnosis not present

## 2021-10-23 DIAGNOSIS — Z20822 Contact with and (suspected) exposure to covid-19: Secondary | ICD-10-CM | POA: Insufficient documentation

## 2021-10-23 DIAGNOSIS — R052 Subacute cough: Secondary | ICD-10-CM | POA: Diagnosis not present

## 2021-10-23 DIAGNOSIS — R059 Cough, unspecified: Secondary | ICD-10-CM | POA: Diagnosis present

## 2021-10-23 LAB — RESP PANEL BY RT-PCR (FLU A&B, COVID) ARPGX2
Influenza A by PCR: NEGATIVE
Influenza B by PCR: NEGATIVE
SARS Coronavirus 2 by RT PCR: NEGATIVE

## 2021-10-23 MED ORDER — PREDNISONE 10 MG PO TABS
20.0000 mg | ORAL_TABLET | Freq: Every day | ORAL | 0 refills | Status: DC
Start: 2021-10-23 — End: 2021-12-25
  Filled 2021-10-23: qty 10, 5d supply, fill #0

## 2021-10-23 MED ORDER — AEROCHAMBER PLUS FLO-VU MEDIUM MISC
1.0000 | Freq: Once | Status: AC
  Administered 2021-10-23: 1
  Filled 2021-10-23: qty 1

## 2021-10-23 MED ORDER — PREDNISONE 20 MG PO TABS
40.0000 mg | ORAL_TABLET | Freq: Once | ORAL | Status: AC
  Administered 2021-10-23: 40 mg via ORAL
  Filled 2021-10-23: qty 2

## 2021-10-23 MED ORDER — ALBUTEROL SULFATE HFA 108 (90 BASE) MCG/ACT IN AERS
2.0000 | INHALATION_SPRAY | Freq: Once | RESPIRATORY_TRACT | Status: AC
Start: 2021-10-23 — End: 2021-10-23
  Administered 2021-10-23: 2 via RESPIRATORY_TRACT
  Filled 2021-10-23: qty 6.7

## 2021-10-23 NOTE — ED Triage Notes (Signed)
C/O sinus infection turns to cold since 10/02/2021; was seen at [facility] and was prescribed sinus infection; now c/o cough since the 2/23; negative covid and c xray; and cough has been worsening. ?

## 2021-10-23 NOTE — ED Provider Notes (Signed)
?[facility] EMERG[facility]PT ?Provider Note ? ? ?CSN: [id number] ?Arrival date & time: 10/23/21  1106 ? ?  ? ?History ? ?Chief Complaint  ?Patient presents with  ? Cough  ? ? ?Rhonda Munoz is a 66 y.o. female. ? ?HPI ?66 yo female with cough, sinus infection seen at urgent care and prescribed , now with cough since 2/23. Sinuys infection diagnosed on 2/18 and took augmentin completing a 7 day dose. She was feeling better but increased cough on 2/23.   CXR that day normal.  Covid negative.  Now, with ongoing cough, worsening.  Sob with coughing.  Cough productive clear sputum.  ? ?  ? ?Home Medications ?Prior to Admission medications   ?Medication Sig Start Date End Date Taking? Authorizing Provider  ?predniSONE (DELTASONE) 10 MG tablet Take 2 tablets (20 mg total) by mouth daily. 10/23/21  Yes Margarita Grizzle, MD  ?amoxicillin-clavulanate (AUGMENTIN) 875-125 MG tablet Take 1 tablet by mouth every 12 (twelve) hours. 10/12/21   Eustace Moore, MD  ?aspirin 81 MG EC tablet Take 81 mg by mouth daily.    [provider]  ?B Complex Vitamins (B COMPLEX-B12 PO) Take by mouth.    [provider]  ?benzonatate (TESSALON) 100 MG capsule Take 1 capsule (100 mg total) by mouth every 8 (eight) hours. 10/18/21   Gwyneth Sprout, MD  ?Calcium Carbonate-Vitamin D (CALCIUM 600 +D HIGH POTENCY PO) Take 600 mg by mouth daily.    [provider]  ?desvenlafaxine (PRISTIQ) 100 MG 24 hr tablet Take 1 tablet (100 mg total) by mouth daily. 06/05/21   Christen Butter, NP  ?fluconazole (DIFLUCAN) 150 MG tablet Take 1 tablet (150 mg total) by mouth daily. Repeat in 1 week if needed 10/12/21   Eustace Moore, MD  ?pantoprazole (PROTONIX) 40 MG tablet Take 1 tablet (40 mg total) by mouth daily. 06/05/21   Christen Butter, NP  ?pravastatin (PRAVACHOL) 20 MG tablet Take 1 tablet (20 mg total) by mouth every evening. 06/05/21   Christen Butter, NP  ?   ? ?Allergies    ?Codeine, Hydromorphone, Procaine, and  Venlafaxine   ? ?Review of Systems   ?Review of Systems  ?All other systems reviewed and are negative. ? ?Physical Exam ?Updated Vital Signs ?BP 121/76 (BP Location: Left Arm)   Pulse 85   Temp 99.5 ?F (37.5 ?C)   Resp 18   Ht 1.702 m (5\' 7" )   Wt 90.7 kg   SpO2 98%   BMI 31.32 kg/m?  ?Physical Exam ?Vitals and nursing note reviewed.  ?Constitutional:   ?   Appearance: Normal appearance.  ?HENT:  ?   Head: Normocephalic.  ?   Right Ear: External ear normal.  ?   Left Ear: External ear normal.  ?   Nose: Nose normal.  ?   Mouth/Throat:  ?   Pharynx: Oropharynx is clear.  ?Eyes:  ?   Pupils: Pupils are equal, round, and reactive to light.  ?Cardiovascular:  ?   Rate and Rhythm: Normal rate and regular rhythm.  ?   Pulses: Normal pulses.  ?Pulmonary:  ?   Effort: Pulmonary effort is normal.  ?   Breath sounds: Normal breath sounds.  ?Abdominal:  ?   General: Abdomen is flat. Bowel sounds are normal.  ?   Palpations: Abdomen is soft.  ?Musculoskeletal:     ?   General: Normal range of motion.  ?   Cervical back: Normal range of motion.  ?Skin: ?  General: Skin is warm and dry.  ?Neurological:  ?   General: No focal deficit present.  ?   Mental Status: She is alert.  ?Psychiatric:     ?   Mood and Affect: Mood normal.  ? ? ?ED Results / Procedures / Treatments   ?Labs ?(all labs ordered are listed, but only abnormal results are displayed) ?Labs Reviewed  ?RESP PANEL BY RT-PCR (FLU A&B, COVID) ARPGX2  ? ? ?EKG ?None ? ?Radiology ?DG Chest 2 View ? ?Result Date: 10/23/2021 ?CLINICAL DATA:  Cough. EXAM: CHEST - 2 VIEW COMPARISON:  October 18, 2021. FINDINGS: The heart size and mediastinal contours are within normal limits. Minimal left basilar subsegmental atelectasis or scarring is noted. Right lung is clear. The visualized skeletal structures are unremarkable. IMPRESSION: Minimal left basilar subsegmental atelectasis or scarring. Electronically Signed   By: Lupita Raider M.D.   On: 10/23/2021 12:15    ? ?Procedures ?Procedures  ? ? ?Medications Ordered in ED ?Medications  ?predniSONE (DELTASONE) tablet 40 mg (40 mg Oral Given 10/23/21 1234)  ?albuterol (VENTOLIN HFA) 108 (90 Base) MCG/ACT inhaler 2 puff (2 puffs Inhalation Given 10/23/21 1234)  ?AeroChamber Plus Flo-Vu Medium MISC 1 each (1 each Other Given 10/23/21 1241)  ? ? ?ED Course/ Medical Decision Making/ A&P ?Clinical Course as of 10/23/21 1458  ?Wed Oct 23, 2021  ?1300 Chest x-[place] Pirie reviewed and interpreted with no evidence of acute infiltrate noted ?Radiologist interpretation noted [DR]  ?  ?Clinical Course User Index ?[DR] Margarita Grizzle, MD  ? ?                        ?Medical Decision Making ?67 year old female with recent upper respiratory infection symptoms.  She has ongoing coughing.  She is hemodynamically stable with normal oxygen levels here in the ED.  Chest x-Rhylen Pulido obtained and does not show any evidence of acute infiltrate.  She is given a dose of prednisone here in the ED and albuterol.  This is mainly to help with reactive airway disease and coughing.  There is no apparent wheezing on her exam.  We have discussed pros and cons of both these medicines and she can use them as needed but will stop them if her side effects are worse than her benefits.  Discussed return precautions need for follow-up and she voiced understanding. ? ?Amount and/or Complexity of Data Reviewed ?Radiology: ordered. ? ?Risk ?Prescription drug management. ? ? ? ? ? ? ? ? ? ? ?Final Clinical Impression(s) / ED Diagnoses ?Final diagnoses:  ?Subacute cough  ? ? ?Rx / DC Orders ?ED Discharge Orders   ? ?      Ordered  ?  predniSONE (DELTASONE) 10 MG tablet  Daily       ? 10/23/21 1458  ? ?  ?  ? ?  ? ? ?  ?Margarita Grizzle, MD ?10/23/21 1458 ? ?

## 2021-10-24 ENCOUNTER — Telehealth: Payer: Medicare Other | Admitting: Medical-Surgical

## 2021-10-25 ENCOUNTER — Telehealth: Payer: Self-pay | Admitting: General Practice

## 2021-10-25 NOTE — Telephone Encounter (Signed)
Transition Care Management Follow-up Telephone Call ?Date of discharge and from where: 10/23/21 from [facility] ?How have you been since you were released from the hospital? [place] coughing a lot. Started prednisone for five days. ?Any questions or concerns? No ? ?Items Reviewed: ?Did the pt receive and understand the discharge instructions provided? Yes  ?Medications obtained and verified? No  ?Other? No  ?Any new allergies since your discharge? No  ?Dietary orders reviewed? No ?Do you have support at home? Yes  ? ?Home Care and Equipment/Supplies: ?Were home health services ordered? no ? ?Functional Questionnaire: (I = Independent and D = Dependent) ?ADLs: I ? ?Bathing/Dressing- I ? ?Meal Prep- I ? ?Eating- I ? ?Maintaining continence- I ? ?Transferring/Ambulation- I ? ?Managing Meds- I ? ?Follow up appointments reviewed: ? ?PCP Hospital f/u appt confirmed? Yes  Scheduled to see Christen Butter, NP on 11/08/21 @ 1540. ?Specialist Hospital f/u appt confirmed? No   ?Are transportation arrangements needed? No  ?If their condition worsens, is the pt aware to call PCP or go to the Emergency Dept.? Yes ?Was the patient provided with contact information for the PCP's office or ED? Yes ?Was to pt encouraged to call back with questions or concerns? Yes  ?

## 2021-11-08 ENCOUNTER — Inpatient Hospital Stay: Payer: Medicare Other | Admitting: Medical-Surgical

## 2021-12-06 ENCOUNTER — Ambulatory Visit: Payer: Self-pay | Admitting: Medical-Surgical

## 2021-12-25 ENCOUNTER — Emergency Department (INDEPENDENT_AMBULATORY_CARE_PROVIDER_SITE_OTHER)
Admission: RE | Admit: 2021-12-25 | Discharge: 2021-12-25 | Disposition: A | Discharge status: Home / Self Care | Payer: Medicare Other | Source: Ambulatory Visit

## 2021-12-25 ENCOUNTER — Telehealth: Payer: Self-pay

## 2021-12-25 VITALS — BP 129/78 | HR 85 | Temp 98.7°F | Resp 18

## 2021-12-25 DIAGNOSIS — J209 Acute bronchitis, unspecified: Secondary | ICD-10-CM

## 2021-12-25 DIAGNOSIS — R430 Anosmia: Secondary | ICD-10-CM

## 2021-12-25 LAB — POC SARS CORONAVIRUS 2 AG -  ED: SARS Coronavirus 2 Ag: NEGATIVE

## 2021-12-25 MED ORDER — DEXAMETHASONE 6 MG PO TABS: 6.0000 mg | ORAL_TABLET | Freq: Every day | ORAL | 0 refills | Status: AC

## 2021-12-25 MED ORDER — MONTELUKAST SODIUM 10 MG PO TABS: 10.0000 mg | ORAL_TABLET | Freq: Every day | ORAL | 0 refills | Status: DC

## 2021-12-25 NOTE — ED Provider Notes (Signed)
?[facility] ? ? ? ?CSN: [id number] ?Arrival date & time: 12/25/21  1248 ? ? ?  ? ?History   ?Chief Complaint ?Chief Complaint  ?Patient presents with  ? Cough  ? ? ?HPI ?Rhonda Munoz is a 66 y.o. female.  ? ?Pleasant 66 year old female presents today with concerns of a cough over the past 10 days.  She had reactive airway disease and a cough for most of February in part of March of this year, but states she was finally able to get over it.  Unfortunately however over the past 10 days it has come back.  She states it is very harsh.  It is causing her significant fatigue.  She notes several days ago she did have a fever of 100.2, but that is since resolved.  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  She reports he recently got out of the hospital and she has been in and out of the hospital with him.  Patient reports that 4 days ago she lost her taste and or smell.  Patient personally had COVID in December 2022.  She took Paxlovid without any problems.  She reports that her cough is stuck in her chest, but is able to intermittently get up clear/white sputum.  She does not smoke. ? ? ?Cough ? ?Past Medical History:  ?Diagnosis Date  ? Hyperlipidemia   ? ? ?Patient Active Problem List  ? Diagnosis Date Noted  ? Depression 06/05/2021  ? Left non-suppurative otitis media 09/06/2020  ? Oral candidiasis 09/06/2020  ? Pharyngitis 09/06/2020  ? Osteopenia 04/26/2020  ? Stress at home 04/26/2020  ? Hypercalcemia 01/02/2020  ? Hyperlipemia 01/02/2020  ? Prediabetes 01/02/2020  ? Postmenopausal 12/13/2019  ? Knee pain, left 09/28/2017  ? Gastroesophageal reflux disease with esophagitis 05/18/2017  ? History of colon polyps 05/18/2017  ? ? ?Past Surgical History:  ?Procedure Laterality Date  ? ABDOMINAL HYSTERECTOMY    ? FINGER SURGERY    ? GALLBLADDER SURGERY    ? ? ?OB History   ?No obstetric history on file. ?  ? ? ? ?Home Medications   ? ?Prior to Admission medications    ?Medication Sig Start Date End Date Taking? Authorizing Provider  ?dexamethasone (DECADRON) 6 MG tablet Take 1 tablet (6 mg total) by mouth daily for 5 days. 12/25/21 12/30/21 Yes Harmani Neto L, PA  ?montelukast (SINGULAIR) 10 MG tablet Take 1 tablet (10 mg total) by mouth at bedtime. 12/25/21  Yes Kyiesha Millward L, PA  ?aspirin 81 MG EC tablet Take 81 mg by mouth daily.    [provider]  ?B Complex Vitamins (B COMPLEX-B12 PO) Take by mouth.    [provider]  ?Calcium Carbonate-Vitamin D (CALCIUM 600 +D HIGH POTENCY PO) Take 600 mg by mouth daily.    [provider]  ?desvenlafaxine (PRISTIQ) 100 MG 24 hr tablet Take 1 tablet (100 mg total) by mouth daily. 06/05/21   Christen Butter, NP  ?fluconazole (DIFLUCAN) 150 MG tablet Take 1 tablet (150 mg total) by mouth daily. Repeat in 1 week if needed 10/12/21   Eustace Moore, MD  ?pantoprazole (PROTONIX) 40 MG tablet Take 1 tablet (40 mg total) by mouth daily. 06/05/21   Christen Butter, NP  ?pravastatin (PRAVACHOL) 20 MG tablet Take 1 tablet (20 mg total) by mouth every evening. 06/05/21   Christen Butter, NP  ? ? ?Family History ?Family History  ?Problem Relation Age of Onset  ?  Cancer Mother   ? Hypertension Mother   ? Stroke Mother   ? Diabetes Father   ? Heart attack Father   ? ? ?Social History ?Social History  ? ?Tobacco Use  ? Smoking status: Never  ? Smokeless tobacco: Never  ?Vaping Use  ? Vaping Use: Never used  ?Substance Use Topics  ? Alcohol use: Never  ? Drug use: Never  ? ? ? ?Allergies   ?Codeine, Hydromorphone, Procaine, and Venlafaxine ? ? ?Review of Systems ?Review of Systems  ?Respiratory:  Positive for cough.   ?All other systems reviewed and are negative. ? ? ?Physical Exam ?Triage Vital Signs ?ED Triage Vitals [12/25/21 1301]  ?Enc Vitals Group  ?   BP 129/78  ?   Pulse Rate 85  ?   Resp 18  ?   Temp 98.7 ?F (37.1 ?C)  ?   Temp Source Oral  ?   SpO2 99 %  ?   Weight   ?   Height   ?   Head Circumference   ?   Peak Flow   ?    Pain Score 0  ?   Pain Loc   ?   Pain Edu?   ?   Excl. in GC?   ? ?No data found. ? ?Updated Vital Signs ?BP 129/78 (BP Location: Right Arm)   Pulse 85   Temp 98.7 ?F (37.1 ?C) (Oral)   Resp 18   SpO2 99%  ? ?Visual Acuity ?Right Eye Distance:   ?Left Eye Distance:   ?Bilateral Distance:   ? ?Right Eye Near:   ?Left Eye Near:    ?Bilateral Near:    ? ?Physical Exam ?Vitals and nursing note reviewed.  ?Constitutional:   ?   General: She is not in acute distress. ?   Appearance: Normal appearance. She is not ill-appearing, toxic-appearing or diaphoretic.  ?HENT:  ?   Head: Normocephalic and atraumatic.  ?   Right Ear: Tympanic membrane, ear canal and external ear normal. There is no impacted cerumen.  ?   Left Ear: Tympanic membrane, ear canal and external ear normal. There is no impacted cerumen.  ?   Nose: Rhinorrhea present. No congestion.  ?   Mouth/Throat:  ?   Mouth: Mucous membranes are moist.  ?   Pharynx: Oropharynx is clear. No oropharyngeal exudate or posterior oropharyngeal erythema.  ?Eyes:  ?   General: No scleral icterus.    ?   Right eye: No discharge.     ?   Left eye: No discharge.  ?   Extraocular Movements: Extraocular movements intact.  ?   Conjunctiva/sclera: Conjunctivae normal.  ?   Pupils: Pupils are equal, round, and reactive to light.  ?Cardiovascular:  ?   Rate and Rhythm: Normal rate and regular rhythm.  ?   Pulses: Normal pulses.  ?   Heart sounds: Normal heart sounds. No murmur heard. ?Pulmonary:  ?   Effort: Pulmonary effort is normal. No respiratory distress.  ?   Breath sounds: Normal breath sounds. No stridor. No rhonchi or rales.  ?   Comments: Harsh wheeze heard to anterior sternum, with normal sounds to lung periphery. NO STRIDOR ?Chest:  ?   Chest wall: No tenderness.  ?Abdominal:  ?   General: Abdomen is flat.  ?Musculoskeletal:  ?   Cervical back: Normal range of motion and neck supple. No rigidity or tenderness.  ?   Right lower leg: No edema.  ?   Left  lower leg: No  edema.  ?Lymphadenopathy:  ?   Cervical: No cervical adenopathy.  ?Skin: ?   General: Skin is warm and dry.  ?   Coloration: Skin is not jaundiced.  ?   Findings: No erythema or rash.  ?Neurological:  ?   General: No focal deficit present.  ?   Mental Status: She is alert and oriented to person, place, and time.  ?Psychiatric:     ?   Mood and Affect: Mood normal.  ? ? ? ?UC Treatments / Results  ?Labs ?(all labs ordered are listed, but only abnormal results are displayed) ?Labs Reviewed  ?POC SARS CORONAVIRUS 2 AG -  ED  ? ? ?EKG ? ? ?Radiology ?No results found. ? ?Procedures ?Procedures (including critical care time) ? ?Medications Ordered in UC ?Medications - No data to display ? ?Initial Impression / Assessment and Plan / UC Course  ?I have reviewed the triage vital signs and the nursing notes. ? ?Pertinent labs & imaging results that were available during my care of the patient were reviewed by me and considered in my medical decision making (see chart for details). ? ?  ? ?Acute tracheobronchitis -prior chest x-rays showed improving abnormalities to the left lower lobe, vital signs stable and no abnormalities heard along peripherally on exam.  No indication for repeat imaging.  Patient responded in the past to prednisone.  Given her history of COVID, will use dexamethasone 6 mg daily.  Will add montelukast at nighttime. ?Anosmia -test negative.  We will do trial of saline sinus sprays and Flonase. ? ?Final Clinical Impressions(s) / UC Diagnoses  ? ?Final diagnoses:  ?Acute tracheobronchitis  ?Anosmia  ? ? ? ?Discharge Instructions   ? ?  ?Your covid test is negative.  ?You are suffering from tracheobronchitis. ?This is usually viral. ?Please start taking dexamethasone 6mg  tab once every morning for the next 5 days. ?Start taking montelukast at night time. ?You may continue your mucinex OTC as long as the only active ingredient is guaifenesin. ?If needed, you can take OTC Delsym at night. ?Try OTC flonase to  help with your decreased taste/smell. ?Follow up with your PCP for repeat imaging should your symptoms persist over 10 days. ?Get respite care to allow time for proper sleep for yourself. ? ? ? ? ?ED Prescriptions

## 2021-12-25 NOTE — Discharge Instructions (Addendum)
Your covid test is negative.  ?You are suffering from tracheobronchitis. ?This is usually viral. ?Please start taking dexamethasone 6mg  tab once every morning for the next 5 days. ?Start taking montelukast at night time. ?You may continue your mucinex OTC as long as the only active ingredient is guaifenesin. ?If needed, you can take OTC Delsym at night. ?Try OTC flonase to help with your decreased taste/smell. ?Follow up with your PCP for repeat imaging should your symptoms persist over 10 days. ?Get respite care to allow time for proper sleep for yourself. ?

## 2021-12-25 NOTE — Telephone Encounter (Signed)
Pt called stating pharmacy never received decadron script. Verbal called into pharmacy for pt. Pt notified. ?

## 2021-12-25 NOTE — ED Triage Notes (Signed)
Pt c/o cough x 10 days. Started with runny nose. Husband recently pos for flu in ER (Thurs) Also is fighting pneumonia. Mucinex max and Nyquil and robitussin prn.  ?

## 2022-01-01 ENCOUNTER — Ambulatory Visit (INDEPENDENT_AMBULATORY_CARE_PROVIDER_SITE_OTHER): Payer: Medicare Other | Admitting: Sports Medicine

## 2022-01-01 ENCOUNTER — Telehealth: Payer: Self-pay

## 2022-01-01 ENCOUNTER — Ambulatory Visit: Payer: Medicare Other | Admitting: Medical-Surgical

## 2022-01-01 ENCOUNTER — Encounter: Payer: Self-pay | Admitting: Sports Medicine

## 2022-01-01 ENCOUNTER — Other Ambulatory Visit (HOSPITAL_BASED_OUTPATIENT_CLINIC_OR_DEPARTMENT_OTHER): Payer: Self-pay

## 2022-01-01 DIAGNOSIS — J310 Chronic rhinitis: Secondary | ICD-10-CM | POA: Diagnosis not present

## 2022-01-01 DIAGNOSIS — J329 Chronic sinusitis, unspecified: Secondary | ICD-10-CM

## 2022-01-01 DIAGNOSIS — E7801 Familial hypercholesterolemia: Secondary | ICD-10-CM

## 2022-01-01 DIAGNOSIS — R7303 Prediabetes: Secondary | ICD-10-CM

## 2022-01-01 MED ORDER — AMOXICILLIN-POT CLAVULANATE 875-125 MG PO TABS: 1.0000 | ORAL_TABLET | Freq: Two times a day (BID) | ORAL | 0 refills | Status: AC

## 2022-01-01 MED ORDER — AMOXICILLIN-POT CLAVULANATE 875-125 MG PO TABS
1.0000 | ORAL_TABLET | Freq: Two times a day (BID) | ORAL | 0 refills | Status: DC
  Filled 2022-01-01: qty 20, 10d supply, fill #0

## 2022-01-01 MED ORDER — PHENYLEPHRINE HCL 10 MG PO TABS: 10.0000 mg | ORAL_TABLET | Freq: Three times a day (TID) | ORAL | 0 refills | Status: DC

## 2022-01-01 MED ORDER — AZELASTINE HCL 0.1 % NA SOLN: 2.0000 | Freq: Two times a day (BID) | NASAL | 1 refills | Status: DC

## 2022-01-01 MED ORDER — AZELASTINE HCL 0.1 % NA SOLN
2.0000 | Freq: Two times a day (BID) | NASAL | 1 refills | Status: DC
  Filled 2022-01-01: qty 301, fill #0

## 2022-01-01 NOTE — Assessment & Plan Note (Signed)
Pleasant 66 year old female, she has had over 2 weeks of nasal discharge, postnasal drip and sore throat, maxillary pain and pressure. ?Mild cough. ?She has gotten some steroids without much improvement. ?She does have tenderness over her maxillary sinuses as well as boggy and erythematous turbinates with significant mucus. ?Oropharyngeal exam is for the most part unrevealing, lungs are clear. ?We will cover both bacterial and allergic etiologies of rhinosinusitis using azelastine, phenylephrine, and Augmentin. ?Return to see [place] if not better in a couple of weeks. ?At that point we would need to consider sinus CT. ?

## 2022-01-01 NOTE — Progress Notes (Signed)
? ? ?  Procedures performed today:   ? ?None. ? ?Independent interpretation of notes and tests performed by another provider:  ? ?None. ? ?Brief History, Exam, Impression, and Recommendations:   ? ?Rhinosinusitis ?Pleasant 66 year old female, she has had over 2 weeks of nasal discharge, postnasal drip and sore throat, maxillary pain and pressure. ?Mild cough. ?She has gotten some steroids without much improvement. ?She does have tenderness over her maxillary sinuses as well as boggy and erythematous turbinates with significant mucus. ?Oropharyngeal exam is for the most part unrevealing, lungs are clear. ?We will cover both bacterial and allergic etiologies of rhinosinusitis using azelastine, phenylephrine, and Augmentin. ?Return to see [place] if not better in a couple of weeks. ?At that point we would need to consider sinus CT. ? ?I spent 30 minutes of total time managing this pleasant patient today, this includes chart review, face to face, and non-face to face time. ? ?___________________________________________ ?Gwen Her. Dianah Field, M.D., ABFM., CAQSM. ?Primary Care and Sports Medicine ?[place] ? ?Adjunct Instructor of Family Medicine  ?[employer] ?

## 2022-01-01 NOTE — Telephone Encounter (Signed)
Patient saw Dr. Karie Schwalbe today and asked if she could change her care to Dr. Ashley Royalty since he takes care of her husband and they have already established a repore.  ?

## 2022-01-01 NOTE — Addendum Note (Signed)
Addended by: Carolin Coy on: 01/01/2022 10:58 AM ? ? Modules accepted: Orders ? ?

## 2022-01-01 NOTE — Telephone Encounter (Signed)
Patient has been removed from [facility]'s PCP list, added to Dr Ashley Royalty and scheduled 6 month f/u with Dr Ashley Royalty on 02/03/22. Patient was wondering if she could have labs done just a few days prior to this 02/03/22 appt? AMUCK ?

## 2022-01-02 NOTE — Addendum Note (Signed)
Addended by: Perlie Mayo on: 01/02/2022 04:48 PM ? ? Modules accepted: Orders ? ?

## 2022-01-02 NOTE — Telephone Encounter (Signed)
Lab orders entered

## 2022-01-16 ENCOUNTER — Ambulatory Visit: Payer: Self-pay | Admitting: Medical-Surgical

## 2022-02-03 ENCOUNTER — Ambulatory Visit: Payer: Medicare Other | Admitting: Family Medicine

## 2022-02-26 ENCOUNTER — Encounter: Payer: Self-pay | Admitting: Family Medicine

## 2022-02-26 ENCOUNTER — Ambulatory Visit (INDEPENDENT_AMBULATORY_CARE_PROVIDER_SITE_OTHER): Payer: Medicare Other

## 2022-02-26 ENCOUNTER — Ambulatory Visit (INDEPENDENT_AMBULATORY_CARE_PROVIDER_SITE_OTHER): Payer: Medicare Other | Admitting: Family Medicine

## 2022-02-26 VITALS — BP 103/67 | HR 80 | Ht 67.0 in | Wt 204.0 lb

## 2022-02-26 DIAGNOSIS — M79662 Pain in left lower leg: Secondary | ICD-10-CM | POA: Diagnosis not present

## 2022-02-26 DIAGNOSIS — F439 Reaction to severe stress, unspecified: Secondary | ICD-10-CM

## 2022-02-26 DIAGNOSIS — Z1231 Encounter for screening mammogram for malignant neoplasm of breast: Secondary | ICD-10-CM

## 2022-02-26 DIAGNOSIS — E7801 Familial hypercholesterolemia: Secondary | ICD-10-CM

## 2022-02-26 MED ORDER — PRAVASTATIN SODIUM 20 MG PO TABS: 20.0000 mg | ORAL_TABLET | Freq: Every evening | ORAL | 3 refills | Status: DC

## 2022-02-26 MED ORDER — DESVENLAFAXINE SUCCINATE ER 100 MG PO TB24: 100.0000 mg | ORAL_TABLET | Freq: Every day | ORAL | 3 refills | Status: DC

## 2022-02-26 MED ORDER — PANTOPRAZOLE SODIUM 40 MG PO TBEC: 40.0000 mg | DELAYED_RELEASE_TABLET | Freq: Every day | ORAL | 3 refills | Status: DC

## 2022-02-26 NOTE — Assessment & Plan Note (Signed)
Ultrasound of left lower extremity ordered to rule out DVT.

## 2022-02-26 NOTE — Progress Notes (Signed)
Rhonda Munoz - 66 y.o. female MRN [id number]  Date of birth: 17-Dec-1955  Subjective Chief Complaint  Patient presents with   Hyperlipidemia   Leg Pain    HPI Rhonda Munoz is a 66 year old female here today for follow-up visit.  She has had pain in her left calf over the past few days.  She does not recall any injury.  She describes it as a stabbing pain that feels deep in her calf.  She has never had history of blood clot.  She has had previous procedure to remove some of her varicose veins.  She did get some relief wearing compression stockings.  I  She has had some continued stress related to her husband's recurrent illnesses.  She remains on Pristiq and is doing well with this.  She feels like this is helpful.  She does have some difficulty sleeping at times.  She does have good support system including her sister and friends.  Remains on pravastatin for management of hyper lipid tolerating well.  ROS:  A comprehensive ROS was completed and negative except as noted per HPI  Allergies  Allergen Reactions   Codeine     Nausea, Rash   Hydromorphone     Tachycardia, rash   Procaine     Nausea, Rash   Venlafaxine     VISUAL HALLUCINATIONS    Past Medical History:  Diagnosis Date   Hyperlipidemia     Past Surgical History:  Procedure Laterality Date   ABDOMINAL HYSTERECTOMY     FINGER SURGERY     GALLBLADDER SURGERY      Social History   Socioeconomic History   Marital status: Married    Spouse name: Not on file   Number of children: Not on file   Years of education: Not on file   Highest education level: Not on file  Occupational History   Occupation: Retired  Tobacco Use   Smoking status: Never   Smokeless tobacco: Never  Vaping Use   Vaping Use: Never used  Substance and Sexual Activity   Alcohol use: Never   Drug use: Never   Sexual activity: Not Currently    Partners: Male  Other Topics Concern   Not on file  Social History Narrative   Not on file    Social Determinants of Health   Financial Resource Strain: Not on file  Food Insecurity: Not on file  Transportation Needs: Not on file  Physical Activity: Not on file  Stress: Not on file  Social Connections: Not on file    Family History  Problem Relation Age of Onset   Cancer Mother    Hypertension Mother    Stroke Mother    Diabetes Father    Heart attack Father     Health Maintenance  Topic Date Due   HIV Screening  Never done   Hepatitis C Screening  Never done   COLONOSCOPY (Pts 45-64yrs Insurance coverage will need to be confirmed)  Never done   COVID-19 Vaccine (4 - Pfizer series) 11/30/2020   INFLUENZA VACCINE  03/25/2022   PAP SMEAR-Modifier  09/04/2022   MAMMOGRAM  02/08/2023   TETANUS/TDAP  01/12/2025   Zoster Vaccines- Shingrix  Completed   HPV VACCINES  Aged Out     ----------------------------------------------------------------------------------------------------------------------------------------------------------------------------------------------------------------- Physical Exam BP 103/67 (BP Location: Right Arm, Patient Position: Sitting, Cuff Size: Large)   Pulse 80   Ht 5\' 7"  (1.702 m)   Wt 204 lb (92.5 kg)   SpO2 100%   BMI 31.95 kg/m  Physical Exam Constitutional:      Appearance: Normal appearance.  Eyes:     General: No scleral icterus. Cardiovascular:     Rate and Rhythm: Normal rate and regular rhythm.  Pulmonary:     Effort: Pulmonary effort is normal.     Breath sounds: Normal breath sounds.  Musculoskeletal:     Comments: There is no swelling in the left lower extremity.  She does have tenderness to deep palpation of the calf.  Negative Homans' sign  Neurological:     Mental Status: She is alert.  Psychiatric:        Mood and Affect: Mood normal.        Behavior: Behavior normal.      ------------------------------------------------------------------------------------------------------------------------------------------------------------------------------------------------------------------- Assessment and Plan  Pain of left calf Ultrasound of left lower extremity ordered to rule out DVT.  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  Doing well with Pristiq at this time..  Has good support system that she can rely on.  Hyperlipemia Doing well with pravastatin.  Recommend continuation.   Meds ordered this encounter  Medications   pravastatin (PRAVACHOL) 20 MG tablet    Sig: Take 1 tablet (20 mg total) by mouth every evening.    Dispense:  90 tablet    Refill:  3   pantoprazole (PROTONIX) 40 MG tablet    Sig: Take 1 tablet (40 mg total) by mouth daily.    Dispense:  90 tablet    Refill:  3   desvenlafaxine (PRISTIQ) 100 MG 24 hr tablet    Sig: Take 1 tablet (100 mg total) by mouth daily.    Dispense:  90 tablet    Refill:  3    No follow-ups on file.    This visit occurred during the SARS-CoV-2 public health emergency.  Safety protocols were in place, including screening questions prior to the visit, additional usage of staff PPE, and extensive cleaning of exam room while observing appropriate contact time as indicated for disinfecting solutions.

## 2022-02-26 NOTE — Assessment & Plan Note (Addendum)
Doing well with pravastatin.  Recommend continuation.

## 2022-02-26 NOTE — Assessment & Plan Note (Signed)
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  Doing well with Pristiq at this time..  Has good support system that she can rely on.

## 2022-02-27 LAB — CBC WITH DIFFERENTIAL/PLATELET
Absolute Monocytes: 390 cells/uL (ref 200–950)
Basophils Absolute: 30 cells/uL (ref 0–200)
Basophils Relative: 0.5 %
Eosinophils Absolute: 306 cells/uL (ref 15–500)
Eosinophils Relative: 5.1 %
HCT: 40.1 % (ref 35.0–45.0)
Hemoglobin: 12.7 g/dL (ref 11.7–15.5)
Lymphs Abs: 2226 cells/uL (ref 850–3900)
MCH: 28.3 pg (ref 27.0–33.0)
MCHC: 31.7 g/dL — ABNORMAL LOW (ref 32.0–36.0)
MCV: 89.5 fL (ref 80.0–100.0)
MPV: 10.6 fL (ref 7.5–12.5)
Monocytes Relative: 6.5 %
Neutro Abs: 3048 cells/uL (ref 1500–7800)
Neutrophils Relative %: 50.8 %
Platelets: 304 10*3/uL (ref 140–400)
RBC: 4.48 10*6/uL (ref 3.80–5.10)
RDW: 13 % (ref 11.0–15.0)
Total Lymphocyte: 37.1 %
WBC: 6 10*3/uL (ref 3.8–10.8)

## 2022-02-27 LAB — COMPLETE METABOLIC PANEL WITH GFR
AG Ratio: 1.4 (calc) (ref 1.0–2.5)
ALT: 20 U/L (ref 6–29)
AST: 16 U/L (ref 10–35)
Albumin: 4.2 g/dL (ref 3.6–5.1)
Alkaline phosphatase (APISO): 74 U/L (ref 37–153)
BUN: 11 mg/dL (ref 7–25)
CO2: 25 mmol/L (ref 20–32)
Calcium: 9.6 mg/dL (ref 8.6–10.4)
Chloride: 106 mmol/L (ref 98–110)
Creat: 0.73 mg/dL (ref 0.50–1.05)
Globulin: 3 g/dL (calc) (ref 1.9–3.7)
Glucose, Bld: 111 mg/dL — ABNORMAL HIGH (ref 65–99)
Potassium: 4.7 mmol/L (ref 3.5–5.3)
Sodium: 143 mmol/L (ref 135–146)
Total Bilirubin: 0.3 mg/dL (ref 0.2–1.2)
Total Protein: 7.2 g/dL (ref 6.1–8.1)
eGFR: 92 mL/min/{1.73_m2} (ref >=60)

## 2022-02-27 LAB — LIPID PANEL W/REFLEX DIRECT LDL
Cholesterol: 155 mg/dL (ref <200)
HDL: 62 mg/dL (ref >=50)
LDL Cholesterol (Calc): 69 mg/dL (calc)
Non-HDL Cholesterol (Calc): 93 mg/dL (calc) (ref <130)
Total CHOL/HDL Ratio: 2.5 (calc) (ref <5.0)
Triglycerides: 162 mg/dL — ABNORMAL HIGH (ref <150)

## 2022-02-27 LAB — HEMOGLOBIN A1C
Hgb A1c MFr Bld: 6 % of total Hgb — ABNORMAL HIGH (ref <5.7)
Mean Plasma Glucose: 126 mg/dL
eAG (mmol/L): 7 mmol/L

## 2022-03-06 ENCOUNTER — Ambulatory Visit: Payer: Medicare Other

## 2022-03-12 ENCOUNTER — Encounter: Payer: Self-pay | Admitting: Family Medicine

## 2022-03-13 ENCOUNTER — Ambulatory Visit: Payer: Medicare Other

## 2022-03-13 MED ORDER — ACYCLOVIR 400 MG PO TABS: 400.0000 mg | ORAL_TABLET | Freq: Three times a day (TID) | ORAL | 1 refills | Status: DC

## 2022-03-21 ENCOUNTER — Other Ambulatory Visit: Payer: Self-pay | Admitting: Family Medicine

## 2022-03-21 ENCOUNTER — Telehealth: Payer: Self-pay

## 2022-03-21 MED ORDER — HYDROXYZINE PAMOATE 25 MG PO CAPS: 25.0000 mg | ORAL_CAPSULE | Freq: Three times a day (TID) | ORAL | 0 refills | Status: DC | PRN

## 2022-03-21 NOTE — Telephone Encounter (Signed)
Spoke with Rhonda Munoz regarding medications to help with anxiety and sleep.  Adding on vistaril 25mg  up to TID prn.  Asked that she call back if this is not helpful.

## 2022-03-21 NOTE — Telephone Encounter (Signed)
Mrs. Rhonda Munoz lvm stating she was having difficulty sleeping. Mr. Rhonda Munoz is in the hospital on the ventilator and the family is discussing possible end of life scenarios. Requesting a low dose of medication to help with anxiety and sleep.   Spoke to Mrs. Gerome Apley. She will send a message to Dr. Ashley Royalty via MyChart for documentation purposes.

## 2022-04-16 ENCOUNTER — Encounter: Payer: Self-pay | Admitting: General Practice

## 2022-04-24 ENCOUNTER — Ambulatory Visit: Payer: Medicare Other

## 2022-05-15 ENCOUNTER — Ambulatory Visit (INDEPENDENT_AMBULATORY_CARE_PROVIDER_SITE_OTHER): Payer: Medicare Other

## 2022-05-15 DIAGNOSIS — Z1231 Encounter for screening mammogram for malignant neoplasm of breast: Secondary | ICD-10-CM | POA: Diagnosis not present

## 2022-05-26 ENCOUNTER — Encounter: Payer: Self-pay | Admitting: Family Medicine

## 2022-05-26 ENCOUNTER — Ambulatory Visit (INDEPENDENT_AMBULATORY_CARE_PROVIDER_SITE_OTHER): Payer: Medicare Other | Admitting: Family Medicine

## 2022-05-26 VITALS — BP 127/77 | HR 78 | Ht 67.0 in | Wt 200.0 lb

## 2022-05-26 DIAGNOSIS — Z0000 Encounter for general adult medical examination without abnormal findings: Secondary | ICD-10-CM | POA: Diagnosis not present

## 2022-05-26 NOTE — Patient Instructions (Signed)
  Ms. Trias , Thank you for taking time to come for your Medicare Wellness Visit. I appreciate your ongoing commitment to your health goals. Please review the following plan we discussed and let me know if I can assist you in the future.   These are the goals we discussed:  Goals   None     This is a list of the screening recommended for you and due dates:  Health Maintenance  Topic Date Due   COVID-19 Vaccine (4 - Pfizer series) 09/25/2022*   Flu Shot  11/23/2022*   Colon Cancer Screening  05/27/2023*   Hepatitis C Screening: USPSTF Recommendation to screen - Ages 18-79 yo.  05/27/2023*   HIV Screening  05/27/2023*   Pap Smear  09/04/2022   Mammogram  05/15/2024   Tetanus Vaccine  01/12/2025   Zoster (Shingles) Vaccine  Completed   HPV Vaccine  Aged Out  *Topic was postponed. The date shown is not the original due date.

## 2022-05-26 NOTE — Progress Notes (Signed)
Medicare Annual Wellness Visit  05/26/2022  Subjective:  Rhonda Munoz is a 66 y.o. female primary care patient of Everrett Coombe, DO who had a Medicare Annual Wellness Visit today.  Has had previous AWV in the past.  Rhonda Munoz is Retired and lives alone. Rhonda Munoz has 2 children. she reports that she is socially active and does interact with friends/family regularly. she is moderately physically active and enjoys walking.  Her husband recently passed away.  She does have good support from friends and family.  She is attending grief share which she finds to be helpful.  She feels that overall she is coping with this well.  Patient Care Team: Everrett Coombe, DO as PCP - General (Family Medicine)     10/23/2021   11:21 AM 10/18/2021    1:15 PM  Advanced Directives  Does Patient Have a Medical Advance Directive? Yes Yes  Type of Estate agent of [place];Living will Healthcare Power of [place];Living will    Hospital Utilization Over the Past 12 Months: # of hospitalizations or ER visits: 0 # of surgeries: 0  Review of Systems    Patient reports that her overall health is unchanged compared to last year.  Review of Systems  Constitutional:  Negative for chills, fever, malaise/fatigue and weight loss.  HENT:  Negative for congestion, ear pain and sore throat.   Eyes:  Negative for blurred vision, double vision and pain.  Respiratory:  Negative for cough and shortness of breath.   Cardiovascular:  Negative for chest pain and palpitations.  Gastrointestinal:  Negative for abdominal pain, blood in stool, constipation, heartburn and nausea.  Genitourinary:  Negative for dysuria and urgency.  Musculoskeletal:  Negative for joint pain and myalgias.  Neurological:  Negative for dizziness and headaches.  Endo/Heme/Allergies:  Does not bruise/bleed easily.  Psychiatric/Behavioral:  Negative for depression. The patient is not nervous/anxious and does not have insomnia.       Pain Assessment       Current Medications & Allergies (verified) Allergies as of 05/26/2022       Reactions   Codeine    Nausea, Rash   Hydromorphone    Tachycardia, rash   Procaine    Nausea, Rash   Venlafaxine    VISUAL HALLUCINATIONS        Medication List        Accurate as of May 26, 2022  8:54 PM. If you have any questions, ask your nurse or doctor.          STOP taking these medications    hydrOXYzine 25 MG capsule Commonly known as: Vistaril Stopped by: Everrett Coombe, DO       TAKE these medications    aspirin EC 81 MG tablet Take 81 mg by mouth daily.   B COMPLEX-B12 PO Take by mouth.   CALCIUM 600 +D HIGH POTENCY PO Take 600 mg by mouth daily.   desvenlafaxine 100 MG 24 hr tablet Commonly known as: PRISTIQ Take 1 tablet (100 mg total) by mouth daily.   pantoprazole 40 MG tablet Commonly known as: PROTONIX Take 1 tablet (40 mg total) by mouth daily.   pravastatin 20 MG tablet Commonly known as: PRAVACHOL Take 1 tablet (20 mg total) by mouth every evening.        History (reviewed): Past Medical History:  Diagnosis Date   Hyperlipidemia    Past Surgical History:  Procedure Laterality Date   ABDOMINAL HYSTERECTOMY     FINGER SURGERY  GALLBLADDER SURGERY     Family History  Problem Relation Age of Onset   Cancer Mother    Hypertension Mother    Stroke Mother    Diabetes Father    Heart attack Father    Social History   Socioeconomic History   Marital status: Married    Spouse name: Not on file   Number of children: Not on file   Years of education: Not on file   Highest education level: Not on file  Occupational History   Occupation: Retired  Tobacco Use   Smoking status: Never   Smokeless tobacco: Never  Vaping Use   Vaping Use: Never used  Substance and Sexual Activity   Alcohol use: Never   Drug use: Never   Sexual activity: Not Currently    Partners: Male  Other Topics Concern   Not on  file  Social History Narrative   Not on file   Social Determinants of Health   Financial Resource Strain: Not on file  Food Insecurity: Not on file  Transportation Needs: Not on file  Physical Activity: Not on file  Stress: Not on file  Social Connections: Not on file    Activities of Daily Living    05/26/2022    8:53 PM  In your present state of health, do you have any difficulty performing the following activities:  Hearing? 0  Vision? 0  Difficulty concentrating or making decisions? 0  Walking or climbing stairs? 0  Dressing or bathing? 0  Doing errands, shopping? 0    Patient Education/Literacy:    Exercise Current Exercise Habits: Home exercise routine, Type of exercise: walking, Time (Minutes): 30, Frequency (Times/Week): 3, Weekly Exercise (Minutes/Week): 90, Intensity: Mild, Exercise limited by: None identified  Diet Patient reports consuming 3 meals a day and 1 snack(s) a day Patient reports that her primary diet is: Regular Patient reports that she does have regular access to food.   Depression Screen    02/26/2022    4:06 PM 06/05/2021    9:30 AM  PHQ 2/9 Scores  PHQ - 2 Score 0 0  PHQ- 9 Score 1 2     Fall Risk    05/26/2022    8:53 PM 06/05/2021    9:30 AM  Fall Risk   Falls in the past year? 0 0  Number falls in past yr: 0 0  Injury with Fall? 0 0  Risk for fall due to : No Fall Risks No Fall Risks  Follow up Falls evaluation completed Falls evaluation completed       Objective:    Ms. Armbrister was alert and able to participate appropriately during our visit.   Today's Vitals   05/26/22 1034  BP: 127/77  Pulse: 78  SpO2: 98%  Weight: 200 lb (90.7 kg)  Height: 5\' 7"  (1.702 m)   Body mass index is 31.32 kg/m.     10/23/2021   11:21 AM 10/18/2021    1:15 PM  Advanced Directives  Does Patient Have a Medical Advance Directive? Yes Yes  Type of Paramedic of [place];Living will [place];Living will    Hearing/Vision  [name] did not seem to have difficulty with hearing/understanding during the face to face visit Reports that she has had a formal eye exam by an eye care professional within the past year Reports that she has not had a formal hearing evaluation within the past year  Cognitive Function:    05/26/2022  10:37 AM  6CIT Screen  What Year? 0 points  What month? 0 points  What time? 0 points  Count back from 20 0 points  Months in reverse 0 points  Repeat phrase 0 points  Total Score 0 points   (Normal:0-7, Significant for Dysfunction: >8)  Normal Cognitive Function Screening: Yes      No data to display           Immunization & Health Maintenance Record Immunization History  Administered Date(s) Administered   Influenza,inj,Quad PF,6+ Mos 06/06/2019, 07/01/2021   Influenza-Unspecified 05/10/2020   PFIZER(Purple Top)SARS-COV-2 Vaccination 11/18/2019, 12/21/2019, 10/05/2020   Tdap 11/24/2014, 01/13/2015   Zoster Recombinat (Shingrix) 02/07/2020, 05/10/2020    Health Maintenance  Topic Date Due   COVID-19 Vaccine (4 - Pfizer series) 09/25/2022 (Originally 11/30/2020)   INFLUENZA VACCINE  11/23/2022 (Originally 03/25/2022)   COLONOSCOPY (Pts 45-1yrs Insurance coverage will need to be confirmed)  05/27/2023 (Originally 07/08/2003)   Hepatitis C Screening  05/27/2023 (Originally 07/07/1976)   HIV Screening  05/27/2023 (Originally 07/07/1973)   PAP SMEAR-Modifier  09/04/2022   MAMMOGRAM  05/15/2024   TETANUS/TDAP  01/12/2025   Zoster Vaccines- Shingrix  Completed   HPV VACCINES  Aged Out       Assessment:  This is a routine wellness examination for [employer].  Health Maintenance: Due or Overdue There are no preventive care reminders to display for this patient.  Liz Pinho does not need a referral for Community Assistance: Care Management:   no Social Work:    no Prescription Assistance:  no Nutrition/Diabetes  Education:  no   Plan:  Personalized Goals  Goals Addressed   None    Personalized Health Maintenance & Screening Recommendations  Influenza vaccine Colorectal cancer screening  Lung Cancer Screening Recommended: no (Low Dose CT Chest recommended if Age 33-80 years, 30 pack-year currently smoking OR have quit w/in past 15 years) Hepatitis C Screening recommended: no HIV Screening recommended: no  Advanced Directives: Written information was prepared per patient's request.  Referrals & Orders No orders of the defined types were placed in this encounter.   Follow-up Plan Follow-up with Everrett Coombe, DO as planned    I have personally reviewed and noted the following in the patient's chart:   Medical and social history Use of alcohol, tobacco or illicit drugs  Current medications and supplements Functional ability and status Nutritional status Physical activity Advanced directives List of other physicians Hospitalizations, surgeries, and ER visits in previous 12 months Vitals Screenings to include cognitive, depression, and falls Referrals and appointments  In addition, I have reviewed and discussed with [name] certain preventive protocols, quality metrics, and best practice recommendations. A written personalized care plan for preventive services as well as general preventive health recommendations is available and can be mailed to the patient at her request.      Everrett Coombe  05/26/2022

## 2022-07-02 ENCOUNTER — Encounter: Payer: Self-pay | Admitting: Family Medicine

## 2022-07-02 ENCOUNTER — Ambulatory Visit (INDEPENDENT_AMBULATORY_CARE_PROVIDER_SITE_OTHER): Payer: Medicare Other | Admitting: Family Medicine

## 2022-07-02 VITALS — BP 120/73 | HR 89 | Ht 67.0 in | Wt 211.0 lb

## 2022-07-02 DIAGNOSIS — B372 Candidiasis of skin and nail: Secondary | ICD-10-CM

## 2022-07-02 DIAGNOSIS — Z23 Encounter for immunization: Secondary | ICD-10-CM | POA: Diagnosis not present

## 2022-07-02 MED ORDER — NYSTATIN 100000 UNIT/GM EX CREA: 1.0000 | TOPICAL_CREAM | Freq: Two times a day (BID) | CUTANEOUS | 1 refills | Status: DC

## 2022-07-02 MED ORDER — NYSTATIN 100000 UNIT/GM EX POWD: 1.0000 | Freq: Three times a day (TID) | CUTANEOUS | 0 refills | Status: DC

## 2022-07-02 NOTE — Progress Notes (Signed)
Rhonda Munoz - 66 y.o. female MRN [id number]  Date of birth: [birth year]  Subjective Chief Complaint  Patient presents with   Rash    HPI Rhonda Munoz  is a 66 year old female here today with complaint of rash.  Rash located in the groin and lower abdomen area.  She has had this for about a week or 2.  She has tried Monistat cream without much improvement.  Area is itchy with some burning and irritation.  She denies any changes to bath products or laundry detergents.  ROS:  A comprehensive ROS was completed and negative except as noted per HPI  Allergies  Allergen Reactions   Codeine     Nausea, Rash   Hydromorphone     Tachycardia, rash   Procaine     Nausea, Rash   Venlafaxine     VISUAL HALLUCINATIONS    Past Medical History:  Diagnosis Date   Hyperlipidemia     Past Surgical History:  Procedure Laterality Date   ABDOMINAL HYSTERECTOMY     FINGER SURGERY     GALLBLADDER SURGERY      Social History   Socioeconomic History   Marital status: Widowed    Spouse name: Not on file   Number of children: Not on file   Years of education: Not on file   Highest education level: Not on file  Occupational History   Occupation: Retired  Tobacco Use   Smoking status: Never   Smokeless tobacco: Never  Vaping Use   Vaping Use: Never used  Substance and Sexual Activity   Alcohol use: Never   Drug use: Never   Sexual activity: Not Currently    Partners: Male  Other Topics Concern   Not on file  Social History Narrative   Not on file   Social Determinants of Health   Financial Resource Strain: Not on file  Food Insecurity: Not on file  Transportation Needs: Not on file  Physical Activity: Not on file  Stress: Not on file  Social Connections: Not on file    Family History  Problem Relation Age of Onset   Cancer Mother    Hypertension Mother    Stroke Mother    Diabetes Father    Heart attack Father     Health Maintenance  Topic Date Due    COVID-19 Vaccine (4 - Pfizer series) 09/25/2022 (Originally 11/30/2020)   COLONOSCOPY (Pts 45-72yrs Insurance coverage will need to be confirmed)  05/27/2023 (Originally 07/08/2003)   Hepatitis C Screening  05/27/2023 (Originally 07/07/1976)   HIV Screening  05/27/2023 (Originally 07/07/1973)   PAP SMEAR-Modifier  09/04/2022   Medicare Annual Wellness (AWV)  05/27/2023   MAMMOGRAM  05/15/2024   TETANUS/TDAP  01/12/2025   INFLUENZA VACCINE  Completed   Zoster Vaccines- Shingrix  Completed   HPV VACCINES  Aged Out     ----------------------------------------------------------------------------------------------------------------------------------------------------------------------------------------------------------------- Physical Exam BP 120/73 (BP Location: Left Arm, Patient Position: Sitting, Cuff Size: Normal)   Pulse 89   Ht 5\' 7"  (1.702 m)   Wt 211 lb (95.7 kg)   SpO2 100%   BMI 33.05 kg/m   Physical Exam Constitutional:      Appearance: Normal appearance.  HENT:     Head: Normocephalic.  Skin:    Comments: Patient preferred a female completed her exam today.  Dr. did examine her and noted that she had erythema in her intertriginous areas around the groin and under the pannus.  Additionally, satellite lesions were noted  Neurological:  General: No focal deficit present.     Mental Status: She is alert.     ------------------------------------------------------------------------------------------------------------------------------------------------------------------------------------------------------------------- Assessment and Plan  Yeast dermatitis She does have fluconazole at home recommended she take this initially.  Additionally will add nystatin cream and powder for treatment of these areas.  She will let me know if not improving.   Meds ordered this encounter  Medications   nystatin (MYCOSTATIN/NYSTOP) powder    Sig: Apply 1 Application topically 3  (three) times daily.    Dispense:  60 g    Refill:  0   nystatin cream (MYCOSTATIN)    Sig: Apply 1 Application topically 2 (two) times daily.    Dispense:  30 g    Refill:  1    No follow-ups on file.    This visit occurred during the SARS-CoV-2 public health emergency.  Safety protocols were in place, including screening questions prior to the visit, additional usage of staff PPE, and extensive cleaning of exam room while observing appropriate contact time as indicated for disinfecting solutions.

## 2022-07-02 NOTE — Assessment & Plan Note (Signed)
She does have fluconazole at home recommended she take this initially.  Additionally will add nystatin cream and powder for treatment of these areas.  She will let me know if not improving.

## 2022-07-21 ENCOUNTER — Ambulatory Visit
Admission: EM | Admit: 2022-07-21 | Discharge: 2022-07-21 | Disposition: A | Discharge status: Home / Self Care | Payer: Medicare Other | Attending: Family Medicine | Admitting: Family Medicine

## 2022-07-21 DIAGNOSIS — F32A Depression, unspecified: Secondary | ICD-10-CM | POA: Diagnosis not present

## 2022-07-21 DIAGNOSIS — E785 Hyperlipidemia, unspecified: Secondary | ICD-10-CM | POA: Diagnosis not present

## 2022-07-21 DIAGNOSIS — R509 Fever, unspecified: Secondary | ICD-10-CM | POA: Diagnosis not present

## 2022-07-21 DIAGNOSIS — R6889 Other general symptoms and signs: Secondary | ICD-10-CM | POA: Diagnosis not present

## 2022-07-21 DIAGNOSIS — Z6832 Body mass index (BMI) 32.0-32.9, adult: Secondary | ICD-10-CM | POA: Insufficient documentation

## 2022-07-21 DIAGNOSIS — Z1152 Encounter for screening for COVID-19: Secondary | ICD-10-CM | POA: Diagnosis present

## 2022-07-21 DIAGNOSIS — B370 Candidal stomatitis: Secondary | ICD-10-CM | POA: Insufficient documentation

## 2022-07-21 DIAGNOSIS — H9202 Otalgia, left ear: Secondary | ICD-10-CM | POA: Diagnosis not present

## 2022-07-21 DIAGNOSIS — J029 Acute pharyngitis, unspecified: Secondary | ICD-10-CM | POA: Diagnosis not present

## 2022-07-21 HISTORY — DX: Gastro-esophageal reflux disease without esophagitis: K21.9

## 2022-07-21 HISTORY — DX: Depression, unspecified: F32.A

## 2022-07-21 LAB — RESP PANEL BY RT-PCR (FLU A&B, COVID) ARPGX2
Influenza A by PCR: NEGATIVE
Influenza B by PCR: NEGATIVE
SARS Coronavirus 2 by RT PCR: NEGATIVE

## 2022-07-21 MED ORDER — AZITHROMYCIN 250 MG PO TABS: 250.0000 mg | ORAL_TABLET | Freq: Every day | ORAL | 0 refills | Status: DC

## 2022-07-21 NOTE — ED Triage Notes (Signed)
Pt presents to Urgent Care with c/o L ear pain, sore throat, body aches, and fever x 2 days. Has not done COVID test.

## 2022-07-21 NOTE — ED Provider Notes (Signed)
[facility]    CSN: [id number] Arrival date & time: 07/21/22  6967      History   Chief Complaint Chief Complaint  Patient presents with   Otalgia   Sore Throat   Fever   Generalized Body Aches    HPI Rhonda Munoz is a 66 y.o. female.   HPI 66 year old female presents with left ear pain, sore throat generalized body aches and fever for 2 days.  PMH significant for morbid obesity, depression, GERD, and HLD.  Past Medical History:  Diagnosis Date   Depression    GERD (gastroesophageal reflux disease)    Hyperlipidemia     Patient Active Problem List   Diagnosis Date Noted   Yeast dermatitis 07/02/2022   Depression 06/05/2021   Oral candidiasis 09/06/2020   Osteopenia 04/26/2020   Stress at home 04/26/2020   Hypercalcemia 01/02/2020   Hyperlipemia 01/02/2020   Prediabetes 01/02/2020   Postmenopausal 12/13/2019   Knee pain, left 09/28/2017   Gastroesophageal reflux disease with esophagitis 05/18/2017   History of colon polyps 05/18/2017    Past Surgical History:  Procedure Laterality Date   ABDOMINAL HYSTERECTOMY     FINGER SURGERY     GALLBLADDER SURGERY      OB History   No obstetric history on file.      Home Medications    Prior to Admission medications   Medication Sig Start Date End Date Taking? Authorizing Provider  acetaminophen (TYLENOL) 325 MG tablet Take 650 mg by mouth every 6 (six) hours as needed.   Yes [provider]  azithromycin (ZITHROMAX) 250 MG tablet Take 1 tablet (250 mg total) by mouth daily. Take first 2 tablets together, then 1 every day until finished. 07/21/22  Yes Trevor Iha, FNP  Ascorbic Acid (VITAMIN C) 1000 MG tablet  05/25/22   [provider]  aspirin 81 MG EC tablet Take 81 mg by mouth daily.    [provider]  Calcium Carbonate-Vitamin D (CALCIUM 600 +D HIGH POTENCY PO) Take 600 mg by mouth daily.    [provider]  desvenlafaxine (PRISTIQ) 100 MG 24 hr tablet  Take 1 tablet (100 mg total) by mouth daily. 02/26/22   Everrett Coombe, DO  nystatin (MYCOSTATIN/NYSTOP) powder Apply 1 Application topically 3 (three) times daily. 07/02/22   Everrett Coombe, DO  nystatin cream (MYCOSTATIN) Apply 1 Application topically 2 (two) times daily. 07/02/22   Everrett Coombe, DO  pantoprazole (PROTONIX) 40 MG tablet Take 1 tablet (40 mg total) by mouth daily. 02/26/22   Everrett Coombe, DO  pravastatin (PRAVACHOL) 20 MG tablet Take 1 tablet (20 mg total) by mouth every evening. 02/26/22   Everrett Coombe, DO  Quercetin 500 MG CAPS  05/25/22   [provider]  Zinc 50 MG TABS  05/25/22   [provider]    Family History Family History  Problem Relation Age of Onset   Cancer Mother    Hypertension Mother    Stroke Mother    Diabetes Father    Heart attack Father     Social History Social History   Tobacco Use   Smoking status: Never   Smokeless tobacco: Never  Vaping Use   Vaping Use: Never used  Substance Use Topics   Alcohol use: Never   Drug use: Never     Allergies   Codeine, Hydromorphone, Procaine, and Venlafaxine   Review of Systems Review of Systems  Constitutional:  Positive for fever.  HENT:  Positive for congestion, ear  pain and sore throat.   Musculoskeletal:  Positive for myalgias.  All other systems reviewed and are negative.    Physical Exam Triage Vital Signs ED Triage Vitals  Enc Vitals Group     BP 07/21/22 0900 110/74     Pulse Rate 07/21/22 0900 94     Resp 07/21/22 0900 20     Temp 07/21/22 0900 99 F (37.2 C)     Temp Source 07/21/22 0900 Oral     SpO2 07/21/22 0900 96 %     Weight 07/21/22 0858 210 lb (95.3 kg)     Height 07/21/22 0858 5\' 7"  (1.702 m)     Head Circumference --      Peak Flow --      Pain Score 07/21/22 0857 6     Pain Loc --      Pain Edu? --      Excl. in GC? --    No data found.  Updated Vital Signs BP 110/74 (BP Location: Right Arm)   Pulse 94   Temp 99 F (37.2 C) (Oral)    Resp 20   Ht 5\' 7"  (1.702 m)   Wt 210 lb (95.3 kg)   SpO2 96%   BMI 32.89 kg/m      Physical Exam Vitals and nursing note reviewed.  Constitutional:      Appearance: Normal appearance. She is well-developed. She is obese. She is ill-appearing.  HENT:     Head: Normocephalic and atraumatic.     Right Ear: Tympanic membrane, ear canal and external ear normal.     Left Ear: Tympanic membrane, ear canal and external ear normal.     Mouth/Throat:     Mouth: Mucous membranes are moist.     Pharynx: Oropharynx is clear. Uvula midline.  Eyes:     Extraocular Movements: Extraocular movements intact.     Conjunctiva/sclera: Conjunctivae normal.     Pupils: Pupils are equal, round, and reactive to light.  Cardiovascular:     Rate and Rhythm: Normal rate and regular rhythm.     Heart sounds: Normal heart sounds. No murmur heard. Pulmonary:     Effort: Pulmonary effort is normal.     Breath sounds: Normal breath sounds.  Musculoskeletal:        General: Normal range of motion.     Cervical back: Normal range of motion and neck supple.  Skin:    General: Skin is warm and dry.  Neurological:     General: No focal deficit present.     Mental Status: She is alert and oriented to person, place, and time. Mental status is at baseline.      UC Treatments / Results  Labs (all labs ordered are listed, but only abnormal results are displayed) Labs Reviewed  RESP PANEL BY RT-PCR (FLU A&B, COVID) [id number]    EKG   Radiology No results found.  Procedures Procedures (including critical care time)  Medications Ordered in UC Medications - No data to display  Initial Impression / Assessment and Plan / UC Course  I have reviewed the triage vital signs and the nursing notes.  Pertinent labs & imaging results that were available during my care of the patient were reviewed by me and considered in my medical decision making (see chart for details).     MDM: 1.  Pharyngitis-Rx'd  Zithromax; 2.  Fever-lab 07/23/22 ordered, advised OTC Tylenol. Advised patient may take Zithromax as directed with food to completion if sore throat worsens.  Encouraged patient to increase daily water intake to 64 ounces per day while taking this medication.  Advised may take OTC Tylenol 1000 to 4000 mg daily, as needed for fever.  Advised we will follow-up with COVID-19/influenza results once received.  Encouraged patient if symptoms worsen please follow-up with PCP or here for further evaluation.  Final Clinical Impressions(s) / UC Diagnoses   Final diagnoses:  Flu-like symptoms  Pharyngitis, unspecified etiology     Discharge Instructions      Advised patient may take Zithromax as directed with food to completion if sore throat worsens.  Encouraged patient to increase daily water intake to 64 ounces per day while taking this medication.  Advised may take OTC Tylenol 1000 to 4000 mg daily, as needed for fever.  Advised we will follow-up with COVID-19/influenza results once received. Encouraged patient if symptoms worsen please follow-up with PCP or here for further evaluation.     ED Prescriptions     Medication Sig Dispense Auth. Provider   azithromycin (ZITHROMAX) 250 MG tablet Take 1 tablet (250 mg total) by mouth daily. Take first 2 tablets together, then 1 every day until finished. 6 tablet Trevor Iha, FNP      PDMP not reviewed this encounter.   Trevor Iha, FNP 07/21/22 1025

## 2022-07-21 NOTE — Discharge Instructions (Addendum)
Advised patient may take Zithromax as directed with food to completion if sore throat worsens.  Encouraged patient to increase daily water intake to 64 ounces per day while taking this medication.  Advised may take OTC Tylenol 1000 to 4000 mg daily, as needed for fever.  Advised we will follow-up with COVID-19/influenza results once received. Encouraged patient if symptoms worsen please follow-up with PCP or here for further evaluation.

## 2022-07-30 ENCOUNTER — Other Ambulatory Visit: Payer: Self-pay | Admitting: Medical-Surgical

## 2022-08-25 HISTORY — PX: CATARACT EXTRACTION, BILATERAL: SHX1313

## 2022-09-16 ENCOUNTER — Ambulatory Visit
Admission: RE | Admit: 2022-09-16 | Discharge: 2022-09-16 | Disposition: A | Discharge status: Home / Self Care | Payer: Medicare Other | Source: Ambulatory Visit | Attending: Urgent Care | Admitting: Urgent Care

## 2022-09-16 VITALS — BP 132/84 | HR 75 | Temp 98.4°F | Resp 17

## 2022-09-16 DIAGNOSIS — M26609 Unspecified temporomandibular joint disorder, unspecified side: Secondary | ICD-10-CM

## 2022-09-16 DIAGNOSIS — K047 Periapical abscess without sinus: Secondary | ICD-10-CM

## 2022-09-16 DIAGNOSIS — H9202 Otalgia, left ear: Secondary | ICD-10-CM

## 2022-09-16 DIAGNOSIS — R590 Localized enlarged lymph nodes: Secondary | ICD-10-CM | POA: Diagnosis not present

## 2022-09-16 MED ORDER — AMOXICILLIN-POT CLAVULANATE 875-125 MG PO TABS: 1.0000 | ORAL_TABLET | Freq: Two times a day (BID) | ORAL | 0 refills | Status: DC

## 2022-09-16 MED ORDER — NAPROXEN 500 MG PO TABS: 500.0000 mg | ORAL_TABLET | Freq: Two times a day (BID) | ORAL | 0 refills | Status: DC

## 2022-09-16 MED ORDER — BACLOFEN 10 MG PO TABS: 10.0000 mg | ORAL_TABLET | Freq: Three times a day (TID) | ORAL | 0 refills | Status: DC | PRN

## 2022-09-16 NOTE — Discharge Instructions (Signed)
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 Please start taking naproxen twice daily with food for up to 7 days. Take the baclofen up to 3 times daily as needed.  Monitor for any drowsiness. Please read the attached handout. Apply a moist warm compress over the affected area of your jaw/ear. Follow-up with your dentist if your TMJ symptoms persist.  Additionally, I have called in an antibiotic for you to take twice daily with food.  This will cover any infection that is occurring secondary to your dental procedure. This should also help with your lymph nodes.  Follow up with your PCP or dentist if symptoms persist >7 days

## 2022-09-16 NOTE — ED Provider Notes (Signed)
[facility]    CSN: [id number] Arrival date & time: 09/16/22  1245      History   Chief Complaint Chief Complaint  Patient presents with   Otalgia    LT, APPT 1PM    HPI Rhonda Munoz is a 67 y.o. female.   Pleasant 67 year old female presents today due to concerns of a left ear pain.  She states that it started abruptly on Sunday morning, waking her up due to a sharp intermittent lancinating pain.  She had a dental procedure done on Thursday to the bottom left molar, states the procedure took 2-1/2 hours in which they removed a deep filling, and placed a crown.  Patient reports that her lower jaw still feels "numb", and had significant swelling after the procedure.  Has been using moist warm compresses over the left lower jaw and feels that the swelling has improved.  Since Sunday however she notices her lymph nodes on the left are swollen and inflamed and painful.  She denies a fever.  She states that the ear pain comes in an intermittent fashion and is extremely painful when it occurs.  It is not associated with pressure or touch.  She denies hearing loss or discharge from the ear.  She reports pain over the TMJ as well.  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    Otalgia   Past Medical History:  Diagnosis Date   Depression    GERD (gastroesophageal reflux disease)    Hyperlipidemia     Patient Active Problem List   Diagnosis Date Noted   Yeast dermatitis 07/02/2022   Depression 06/05/2021   Oral candidiasis 09/06/2020   Osteopenia 04/26/2020   Stress at home 04/26/2020   Hypercalcemia 01/02/2020   Hyperlipemia 01/02/2020   Prediabetes 01/02/2020   Postmenopausal 12/13/2019   Knee pain, left 09/28/2017   Gastroesophageal reflux disease with esophagitis 05/18/2017   History of colon polyps 05/18/2017    Past Surgical History:  Procedure Laterality Date   ABDOMINAL HYSTERECTOMY     FINGER SURGERY     GALLBLADDER SURGERY      OB History   No  obstetric history on file.      Home Medications    Prior to Admission medications   Medication Sig Start Date End Date Taking? Authorizing Provider  amoxicillin-clavulanate (AUGMENTIN) 875-125 MG tablet Take 1 tablet by mouth 2 (two) times daily with a meal. 09/16/22  Yes Ramata Strothman L, PA  baclofen (LIORESAL) 10 MG tablet Take 1 tablet (10 mg total) by mouth 3 (three) times daily as needed (ear pain). 09/16/22  Yes Ariam Mol L, PA  naproxen (NAPROSYN) 500 MG tablet Take 1 tablet (500 mg total) by mouth 2 (two) times daily with a meal. 09/16/22  Yes Bretta Fees L, PA  acetaminophen (TYLENOL) 325 MG tablet Take 650 mg by mouth every 6 (six) hours as needed.    [provider]  Ascorbic Acid (VITAMIN C) 1000 MG tablet  05/25/22   [provider]  aspirin 81 MG EC tablet Take 81 mg by mouth daily.    [provider]  Calcium Carbonate-Vitamin D (CALCIUM 600 +D HIGH POTENCY PO) Take 600 mg by mouth daily.    [provider]  desvenlafaxine (PRISTIQ) 100 MG 24 hr tablet Take 1 tablet (100 mg total) by mouth daily. 02/26/22   Luetta Nutting, DO  nystatin (MYCOSTATIN/NYSTOP) powder Apply 1 Application topically 3 (three) times daily. 07/02/22   Luetta Nutting, DO  nystatin cream (  MYCOSTATIN) Apply 1 Application topically 2 (two) times daily. 07/02/22   Everrett Coombe, DO  pantoprazole (PROTONIX) 40 MG tablet Take 1 tablet (40 mg total) by mouth daily. 02/26/22   Everrett Coombe, DO  pravastatin (PRAVACHOL) 20 MG tablet Take 1 tablet (20 mg total) by mouth every evening. 02/26/22   Everrett Coombe, DO  Quercetin 500 MG CAPS  05/25/22   [provider]  Zinc 50 MG TABS  05/25/22   [provider]    Family History Family History  Problem Relation Age of Onset   Cancer Mother    Hypertension Mother    Stroke Mother    Diabetes Father    Heart attack Father     Social History Social History   Tobacco Use   Smoking status: Never    Smokeless tobacco: Never  Vaping Use   Vaping Use: Never used  Substance Use Topics   Alcohol use: Never   Drug use: Never     Allergies   Codeine, Hydromorphone, Procaine, and Venlafaxine   Review of Systems Review of Systems  HENT:  Positive for ear pain.   As per HPI   Physical Exam Triage Vital Signs ED Triage Vitals [09/16/22 1317]  Enc Vitals Group     BP 132/84     Pulse Rate 75     Resp 17     Temp 98.4 F (36.9 C)     Temp Source Oral     SpO2 100 %     Weight      Height      Head Circumference      Peak Flow      Pain Score      Pain Loc      Pain Edu?      Excl. in GC?    No data found.  Updated Vital Signs BP 132/84   Pulse 75   Temp 98.4 F (36.9 C) (Oral)   Resp 17   SpO2 100%   Visual Acuity Right Eye Distance:   Left Eye Distance:   Bilateral Distance:    Right Eye Near:   Left Eye Near:    Bilateral Near:     Physical Exam Vitals and nursing note reviewed.  Constitutional:      General: She is not in acute distress.    Appearance: Normal appearance. She is not ill-appearing, toxic-appearing or diaphoretic.  HENT:     Head: Normocephalic.     Jaw: Tenderness and pain on movement present. No trismus or swelling.     Salivary Glands: Right salivary gland is not diffusely enlarged or tender. Left salivary gland is not diffusely enlarged or tender.      Right Ear: Hearing, tympanic membrane, ear canal and external ear normal.     Left Ear: Hearing, tympanic membrane, ear canal and external ear normal. No drainage or swelling.  No middle ear effusion. No mastoid tenderness.     Ears:     Comments: Pre-auricular lymphadenopathy to L ear    Nose: Nose normal. No nasal tenderness or congestion.     Right Turbinates: Not enlarged or swollen.     Left Turbinates: Not enlarged or swollen.     Right Sinus: No maxillary sinus tenderness or frontal sinus tenderness.     Left Sinus: No maxillary sinus tenderness or frontal sinus  tenderness.     Mouth/Throat:     Lips: Pink.     Mouth: No oral lesions or angioedema.  Dentition: Abnormal dentition (new crown noted). Dental tenderness present. No gingival swelling or dental abscesses.     Palate: No mass and lesions.     Pharynx: Oropharynx is clear. Uvula midline. No pharyngeal swelling, oropharyngeal exudate, posterior oropharyngeal erythema or uvula swelling.   Cardiovascular:     Rate and Rhythm: Normal rate and regular rhythm.  Pulmonary:     Effort: Pulmonary effort is normal. No respiratory distress.     Breath sounds: Normal breath sounds. No stridor.  Chest:     Chest wall: No tenderness.  Musculoskeletal:     Cervical back: Normal range of motion and neck supple. No rigidity or tenderness.  Lymphadenopathy:     Cervical: Cervical adenopathy (L submandibular and anterior cervical chain; none on R) present.  Skin:    General: Skin is warm and dry.     Findings: No erythema or rash.  Neurological:     Mental Status: She is alert.      UC Treatments / Results  Labs (all labs ordered are listed, but only abnormal results are displayed) Labs Reviewed - No data to display  EKG   Radiology No results found.  Procedures Procedures (including critical care time)  Medications Ordered in UC Medications - No data to display  Initial Impression / Assessment and Plan / UC Course  I have reviewed the triage vital signs and the nursing notes.  Pertinent labs & imaging results that were available during my care of the patient were reviewed by me and considered in my medical decision making (see chart for details).     Otalgia L ear -discussed with patient there was no ear infection.  Additionally, I felt that the pain was referred to her ear from her TMJ joint. 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  Additionally, symptoms started 2 days after having a dental procedure in which her mouth was open for 2.5  hours.  Recommended patient to soft foods, take naproxen twice daily and baclofen up to 3 times daily as needed.  Continue moist warm compresses. Lymphadenopathy left cervical region -I question some of her symptoms is coming from a dental infection.  She had a significant procedure and now is having painful lymphadenopathy on the left only.  Will start Augmentin to cover for mouth flora.  Final Clinical Impressions(s) / UC Diagnoses   Final diagnoses:  Otalgia of left ear  TMJ (temporomandibular joint disorder)  Lymphadenopathy of left cervical region  Dental infection     Discharge Instructions      Your ear pain is likely secondary to TMJ. Please start taking naproxen twice daily with food for up to 7 days. Take the baclofen up to 3 times daily as needed.  Monitor for any drowsiness. Please read the attached handout. Apply a moist warm compress over the affected area of your jaw/ear. Follow-up with your dentist if your TMJ symptoms persist.  Additionally, I have called in an antibiotic for you to take twice daily with food.  This will cover any infection that is occurring secondary to your dental procedure. This should also help with your lymph nodes.  Follow up with your PCP or dentist if symptoms persist >7 days     ED Prescriptions     Medication Sig Dispense Auth. Provider   amoxicillin-clavulanate (AUGMENTIN) 875-125 MG tablet Take 1 tablet by mouth 2 (two) times daily with a meal. 14 tablet Kenesha Moshier L, PA   naproxen (NAPROSYN)  500 MG tablet Take 1 tablet (500 mg total) by mouth 2 (two) times daily with a meal. 30 tablet Damin Salido L, PA   baclofen (LIORESAL) 10 MG tablet Take 1 tablet (10 mg total) by mouth 3 (three) times daily as needed (ear pain). 30 each Kemyra August L, PA      PDMP not reviewed this encounter.   Chaney Malling, [place] 09/16/22 1521

## 2022-09-16 NOTE — ED Triage Notes (Addendum)
Pt c/o intermittent LT ear pain since Sunday am. Woke her up in middle of night. Pain is sometimes sharp/stabbing. Zyrtec, mucinex and tylenol prn. Did have a some dental work last week to back left molar.

## 2022-09-17 ENCOUNTER — Telehealth: Payer: Self-pay | Admitting: Emergency Medicine

## 2022-09-17 NOTE — Telephone Encounter (Signed)
Spoke with patient and states that she was only able to get one dose of antibiotic in her system last night due to [facility] not having her medication ready.  Patient had a rough night trying to rest.  She has already taken her dose today with the Naproxen, hoping for better results.  Patient will also contact her dentist since she recently had extensive dental work performed.  Advised patient that if things do not improve to follow up with her PCP.  Patient voices understanding.

## 2022-09-24 ENCOUNTER — Ambulatory Visit (INDEPENDENT_AMBULATORY_CARE_PROVIDER_SITE_OTHER): Payer: Medicare Other | Admitting: Family Medicine

## 2022-09-24 ENCOUNTER — Encounter: Payer: Self-pay | Admitting: Family Medicine

## 2022-09-24 VITALS — BP 115/75 | HR 92 | Temp 97.7°F | Ht 67.0 in | Wt 207.3 lb

## 2022-09-24 DIAGNOSIS — J069 Acute upper respiratory infection, unspecified: Secondary | ICD-10-CM | POA: Diagnosis not present

## 2022-09-24 MED ORDER — METHYLPREDNISOLONE 4 MG PO TBPK: ORAL_TABLET | ORAL | 0 refills | Status: DC

## 2022-09-24 MED ORDER — PROMETHAZINE-DM 6.25-15 MG/5ML PO SYRP: 5.0000 mL | ORAL_SOLUTION | Freq: Every evening | ORAL | 0 refills | Status: DC | PRN

## 2022-09-24 NOTE — Progress Notes (Signed)
Acute Office Visit  Subjective:     Patient ID: Rhonda Munoz, female    DOB: Nov 07, 1955, 67 y.o.   MRN: [id number]  Chief Complaint  Patient presents with   Cough    Patient in office c/o lingering cough- just finished augmentin last dose yesterday a.m. - given by urgent care on Tuesday 09/16/22 for left  ear pain - cough started while on augmentin-about mid way through - yellow drainage from sinuses  - using robitussin with honey, hot tea with honey     Cough Pertinent negatives include no ear pain, fever, sore throat, shortness of breath, weight loss or wheezing.   Patient is in today for acute visit Pt reports she had left ear pain the Sunday before going to the urgent care on Tuesday. The pain was stabbing in nature. She also had left molar dental work on Thursday before her ear pain started on Sunday. She had left jaw swelling at that time. She was placed on Augmentin for enlarged lymph nodes. She then began to have a cough over the weekend. She also started with rhinorrhea. She has tried robitussin with honey for the cough, not helping.  Denies fever, chest pains, or SOB.  Patient Active Problem List   Diagnosis Date Noted   Yeast dermatitis 07/02/2022   Depression 06/05/2021   Oral candidiasis 09/06/2020   Osteopenia 04/26/2020   Stress at home 04/26/2020   Hypercalcemia 01/02/2020   Hyperlipemia 01/02/2020   Prediabetes 01/02/2020   Postmenopausal 12/13/2019   Knee pain, left 09/28/2017   Gastroesophageal reflux disease with esophagitis 05/18/2017   History of colon polyps 05/18/2017    Review of Systems  Constitutional:  Negative for fever and weight loss.  HENT:  Negative for congestion, ear pain, sinus pain and sore throat.        Rhinorrhea   Respiratory:  Positive for cough. Negative for sputum production, shortness of breath and wheezing.   All other systems reviewed and are negative.       Objective:    BP 115/75   Pulse 92   Temp 97.7 F (36.5 C)    Ht 5\' 7"  (1.702 m)   Wt 207 lb 5 oz (94 kg)   SpO2 99%   BMI 32.47 kg/m    Physical Exam Vitals and nursing note reviewed.  Constitutional:      Appearance: Normal appearance. She is normal weight.  HENT:     Head: Normocephalic and atraumatic.     Right Ear: Tympanic membrane, ear canal and external ear normal.     Left Ear: Tympanic membrane, ear canal and external ear normal.     Nose: Nose normal.     Mouth/Throat:     Mouth: Mucous membranes are moist.  Eyes:     Conjunctiva/sclera: Conjunctivae normal.     Pupils: Pupils are equal, round, and reactive to light.  Cardiovascular:     Rate and Rhythm: Normal rate and regular rhythm.     Pulses: Normal pulses.     Heart sounds: Normal heart sounds.  Pulmonary:     Effort: Pulmonary effort is normal. No respiratory distress.     Breath sounds: No wheezing or rales.  Abdominal:     General: Abdomen is flat.  Skin:    General: Skin is warm.     Capillary Refill: Capillary refill takes less than 2 seconds.  Neurological:     General: No focal deficit present.     Mental Status: She is alert  and oriented to person, place, and time. Mental status is at baseline.  Psychiatric:        Mood and Affect: Mood normal.        Behavior: Behavior normal.        Thought Content: Thought content normal.        Judgment: Judgment normal.    No results found for any visits on 09/24/22.      Assessment & Plan:   Problem List Items Addressed This Visit   None Visit Diagnoses     Viral upper respiratory tract infection    -  Primary   Relevant Medications   promethazine-dextromethorphan (PROMETHAZINE-DM) 6.25-15 MG/5ML syrup   methylPREDNISolone (MEDROL DOSEPAK) 4 MG TBPK tablet       Meds ordered this encounter  Medications   promethazine-dextromethorphan (PROMETHAZINE-DM) 6.25-15 MG/5ML syrup    Sig: Take 5 mLs by mouth at bedtime as needed for cough.    Dispense:  118 mL    Refill:  0   methylPREDNISolone (MEDROL  DOSEPAK) 4 MG TBPK tablet    Sig: 6-day pack as directed    Dispense:  21 tablet    Refill:  0  Viral URI, no red flag symptoms to warrant imaging. Add Medrol dose pack and cough suppressant at night prn.             No follow-ups on file.  Leeanne Rio, MD

## 2022-10-15 ENCOUNTER — Telehealth: Payer: Self-pay | Admitting: Family Medicine

## 2022-10-15 NOTE — Telephone Encounter (Signed)
Called patient to schedule Medicare Annual Wellness Visit (AWV). Left message for patient to call back and schedule Medicare Annual Wellness Visit (AWV).  Last date of AWV: 05/26/22  Please schedule an appointment at any time with Nurse Health Advisor.  If any questions, please contact me at [phone].  Thank you ,  Lin Givens Patient Access Advocate II Direct Dial: [phone]

## 2022-11-06 ENCOUNTER — Ambulatory Visit: Payer: Medicare Other | Admitting: Family Medicine

## 2022-11-12 ENCOUNTER — Ambulatory Visit: Payer: Medicare Other | Admitting: Family Medicine

## 2022-11-13 ENCOUNTER — Ambulatory Visit: Payer: Medicare Other | Admitting: Family Medicine

## 2022-12-05 ENCOUNTER — Other Ambulatory Visit: Payer: Self-pay

## 2022-12-05 ENCOUNTER — Telehealth: Payer: Self-pay | Admitting: Gastroenterology

## 2022-12-05 DIAGNOSIS — Z1211 Encounter for screening for malignant neoplasm of colon: Secondary | ICD-10-CM

## 2022-12-05 NOTE — Telephone Encounter (Signed)
Good afternoon Dr. Barron Alvine   Supervising provider 12/05/22 PM   We received a referral for this patient to schedule a colonoscopy. Patient has history with [facility] in 2019 for an EGD. She has since moved to [place] and would like to establish GI care locally not in [place]. She does have extensive family history of polyps. Records are in Care Everywhere for you to review. Please advise on scheduling.   Thank you.

## 2022-12-05 NOTE — Progress Notes (Signed)
Ordered colonoscopy per protocol

## 2022-12-14 IMAGING — DX DG CHEST 2V
2 series · 2 of 2 positions shown · non-contrast
Comparison: None.

CLINICAL DATA: cough

EXAM:
CHEST - 2 VIEW

[chest pa]
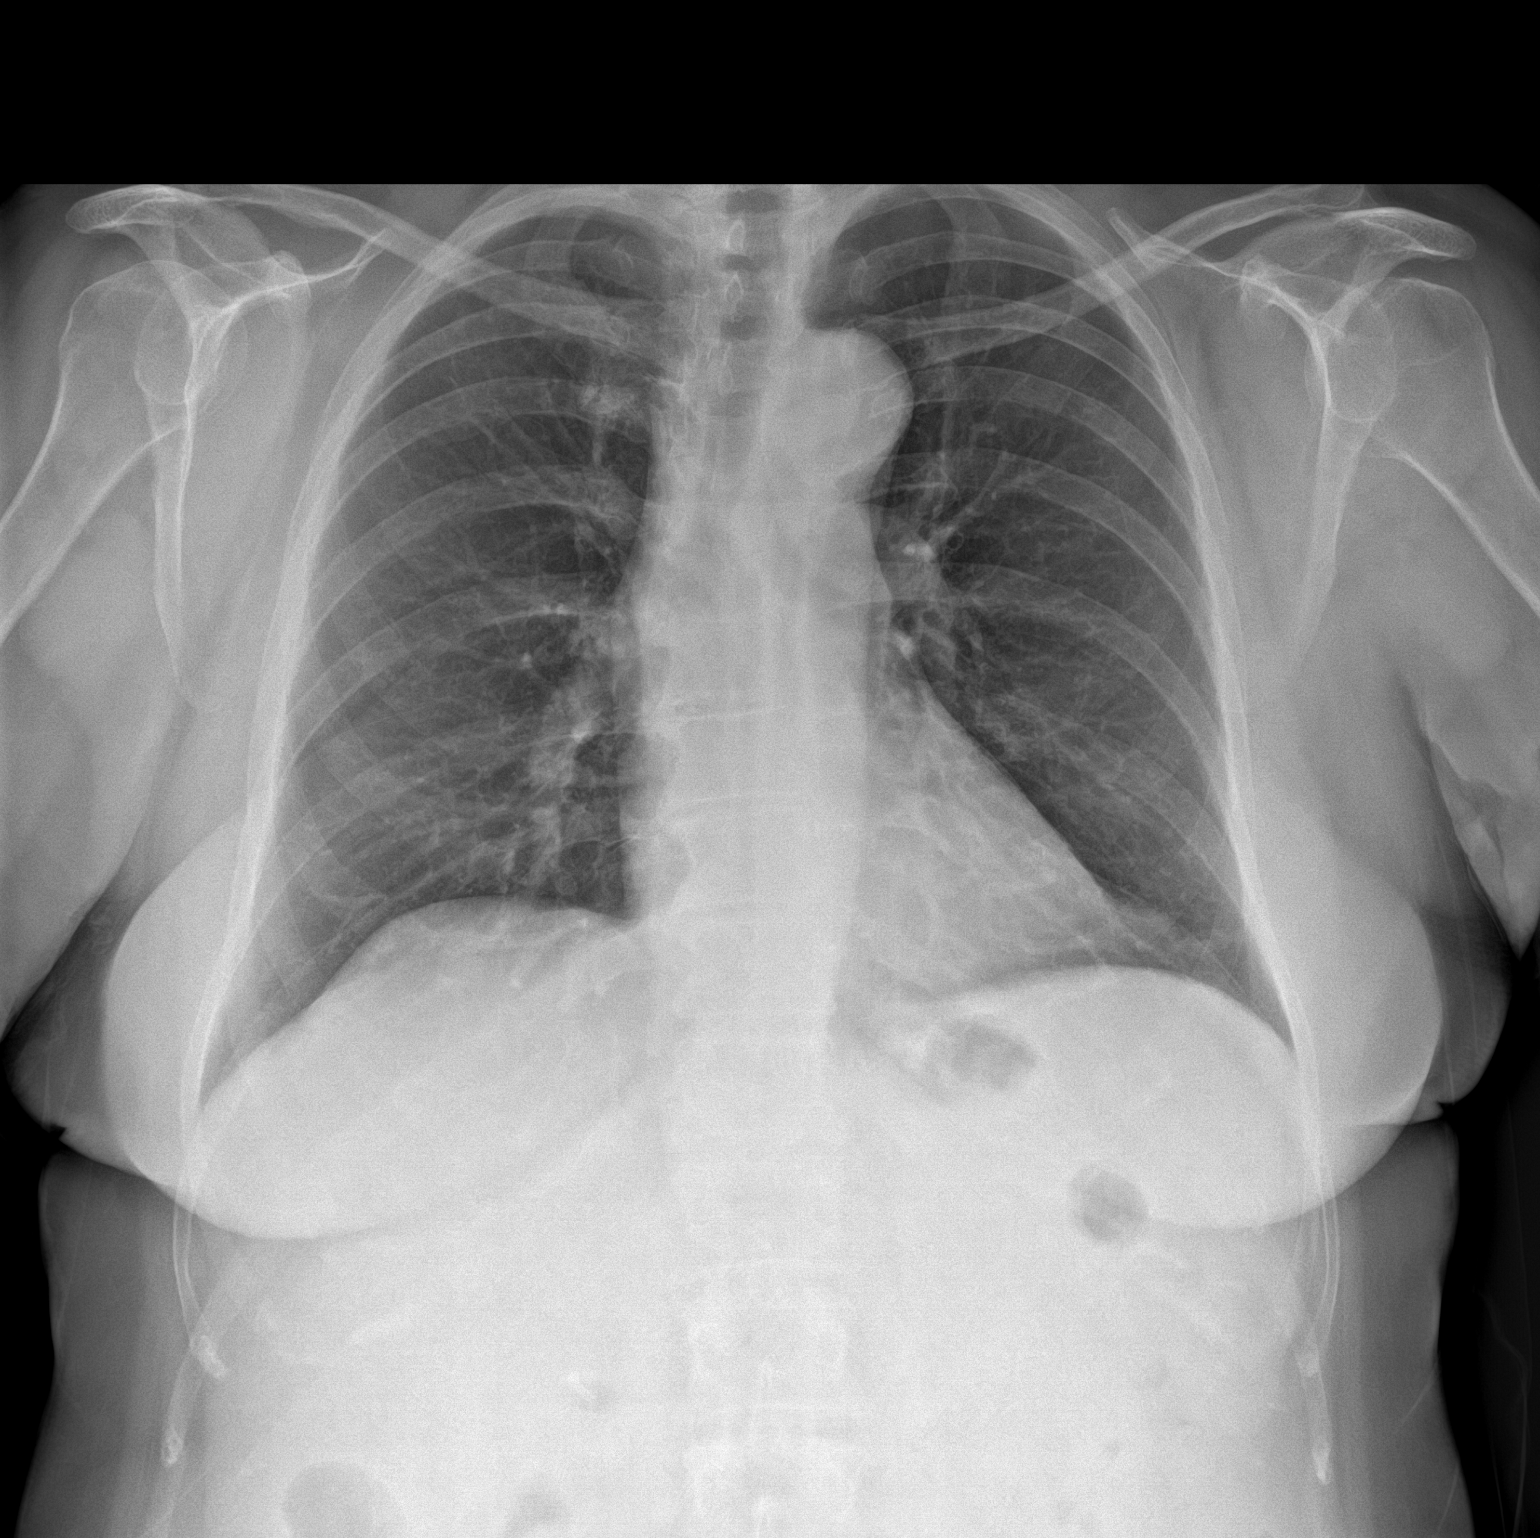

[chest lat]
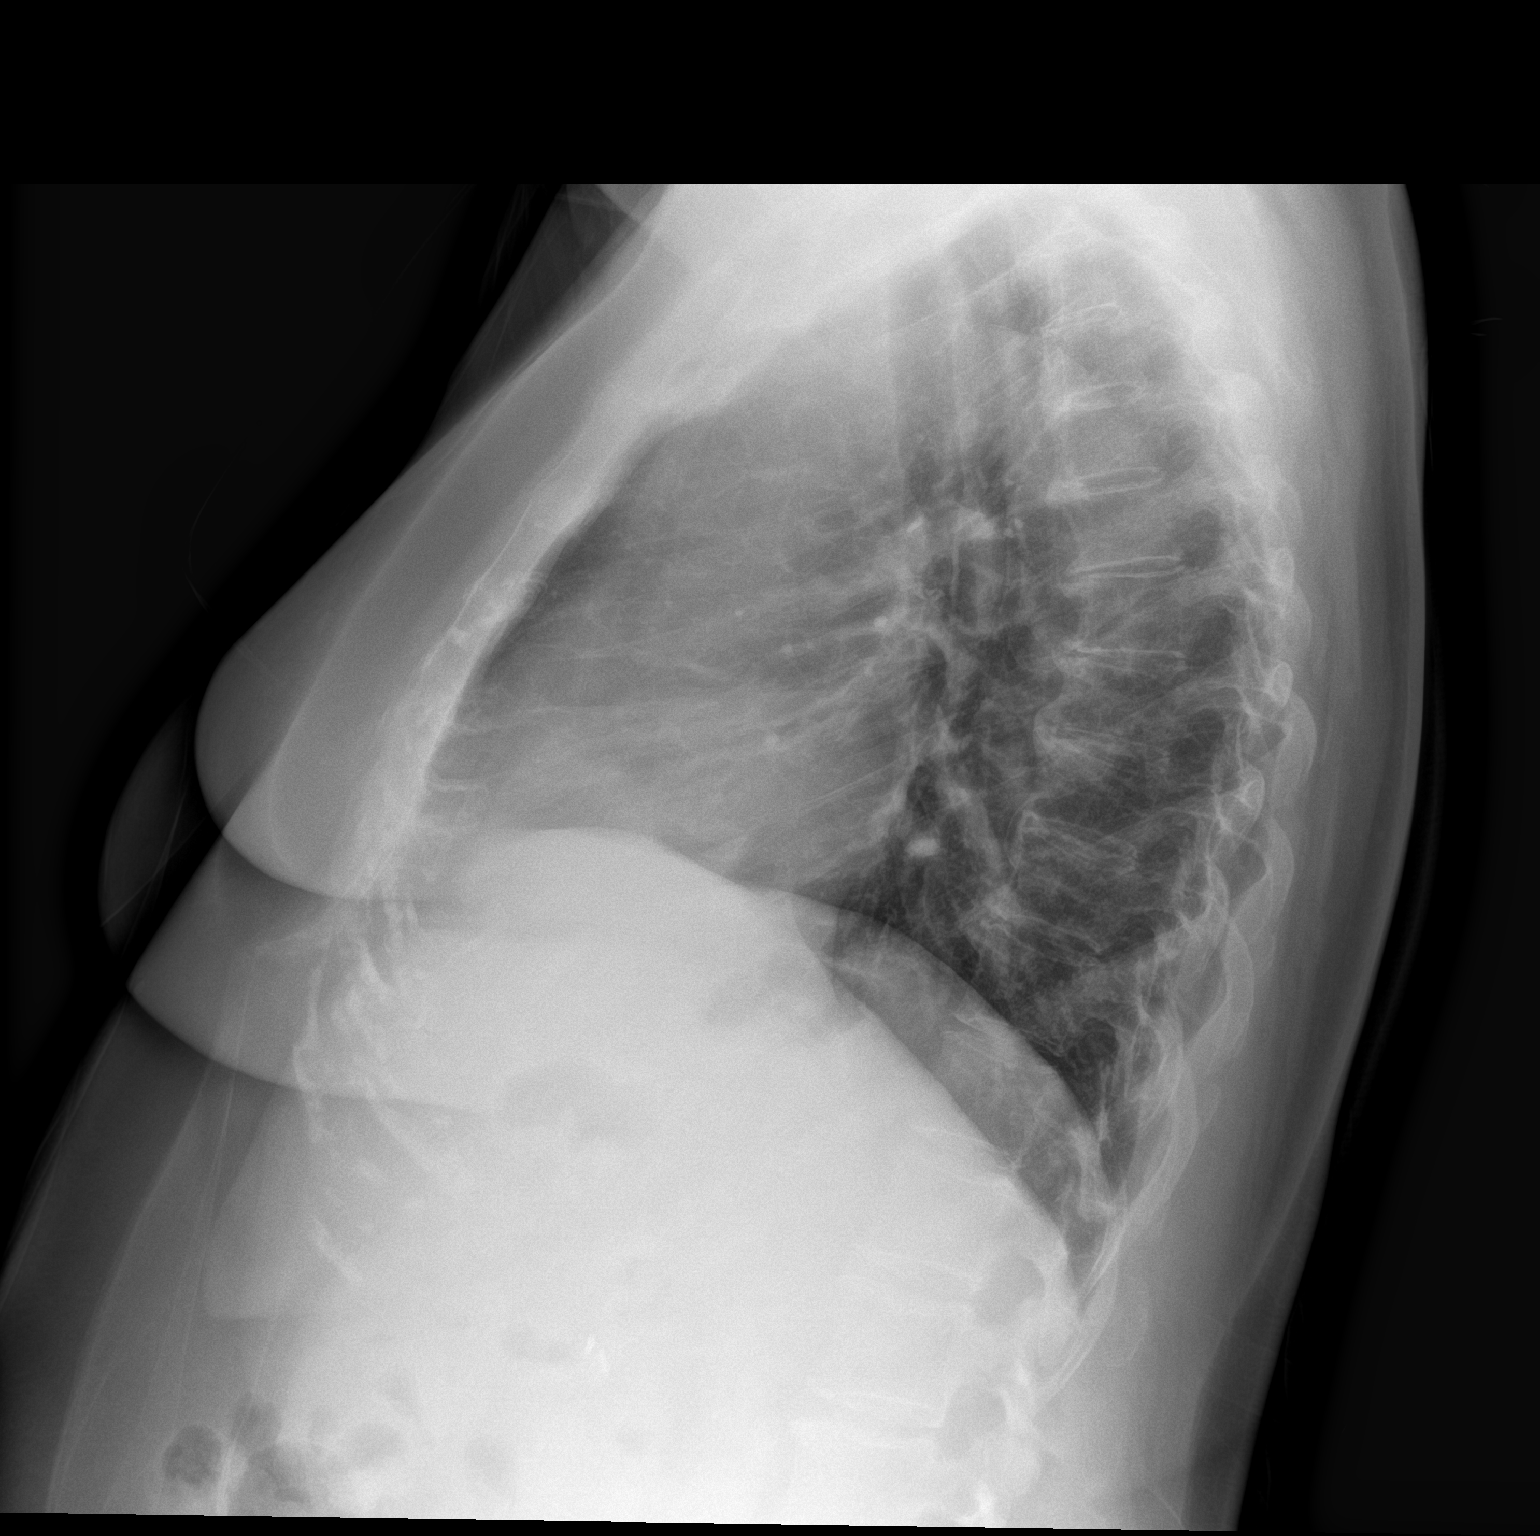

[2 of 2 positions shown; findings below may reference images not displayed]

FINDINGS: Subtle linear left basilar opacity. No visible pleural effusions or
pneumothorax. Cardiomediastinal silhouette is within normal limits.
No evidence of acute osseous abnormality. Degenerative change of the
spine. Cholecystectomy clips.
IMPRESSION: Subtle linear left basilar opacity, favor atelectasis.

## 2022-12-17 ENCOUNTER — Encounter: Payer: Self-pay | Admitting: Nurse Practitioner

## 2022-12-19 IMAGING — DX DG CHEST 2V
2 series · 2 of 2 positions shown · non-contrast
Comparison: October 18, 2021.

CLINICAL DATA: Cough.

EXAM:
CHEST - 2 VIEW

[chest pa]
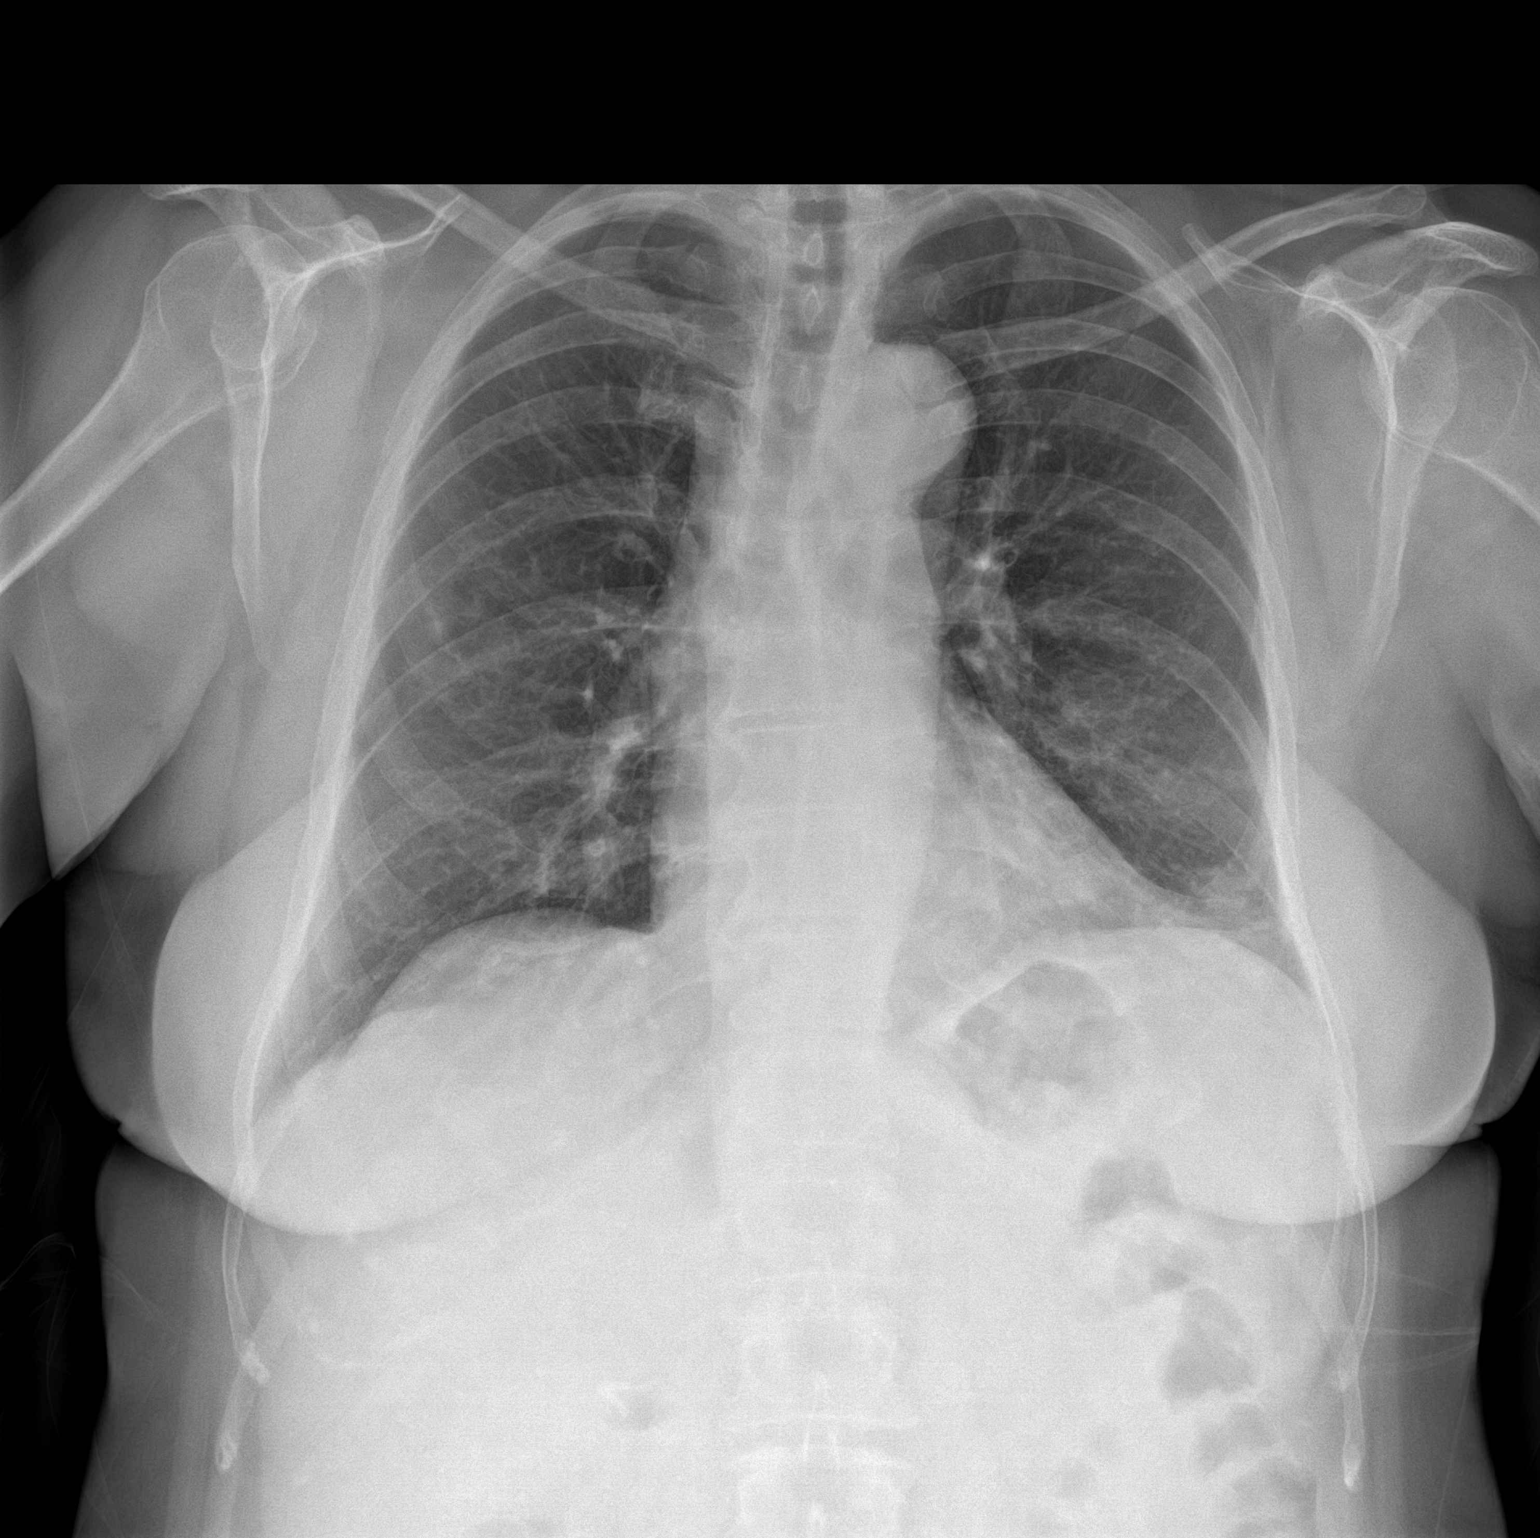

[chest lat]
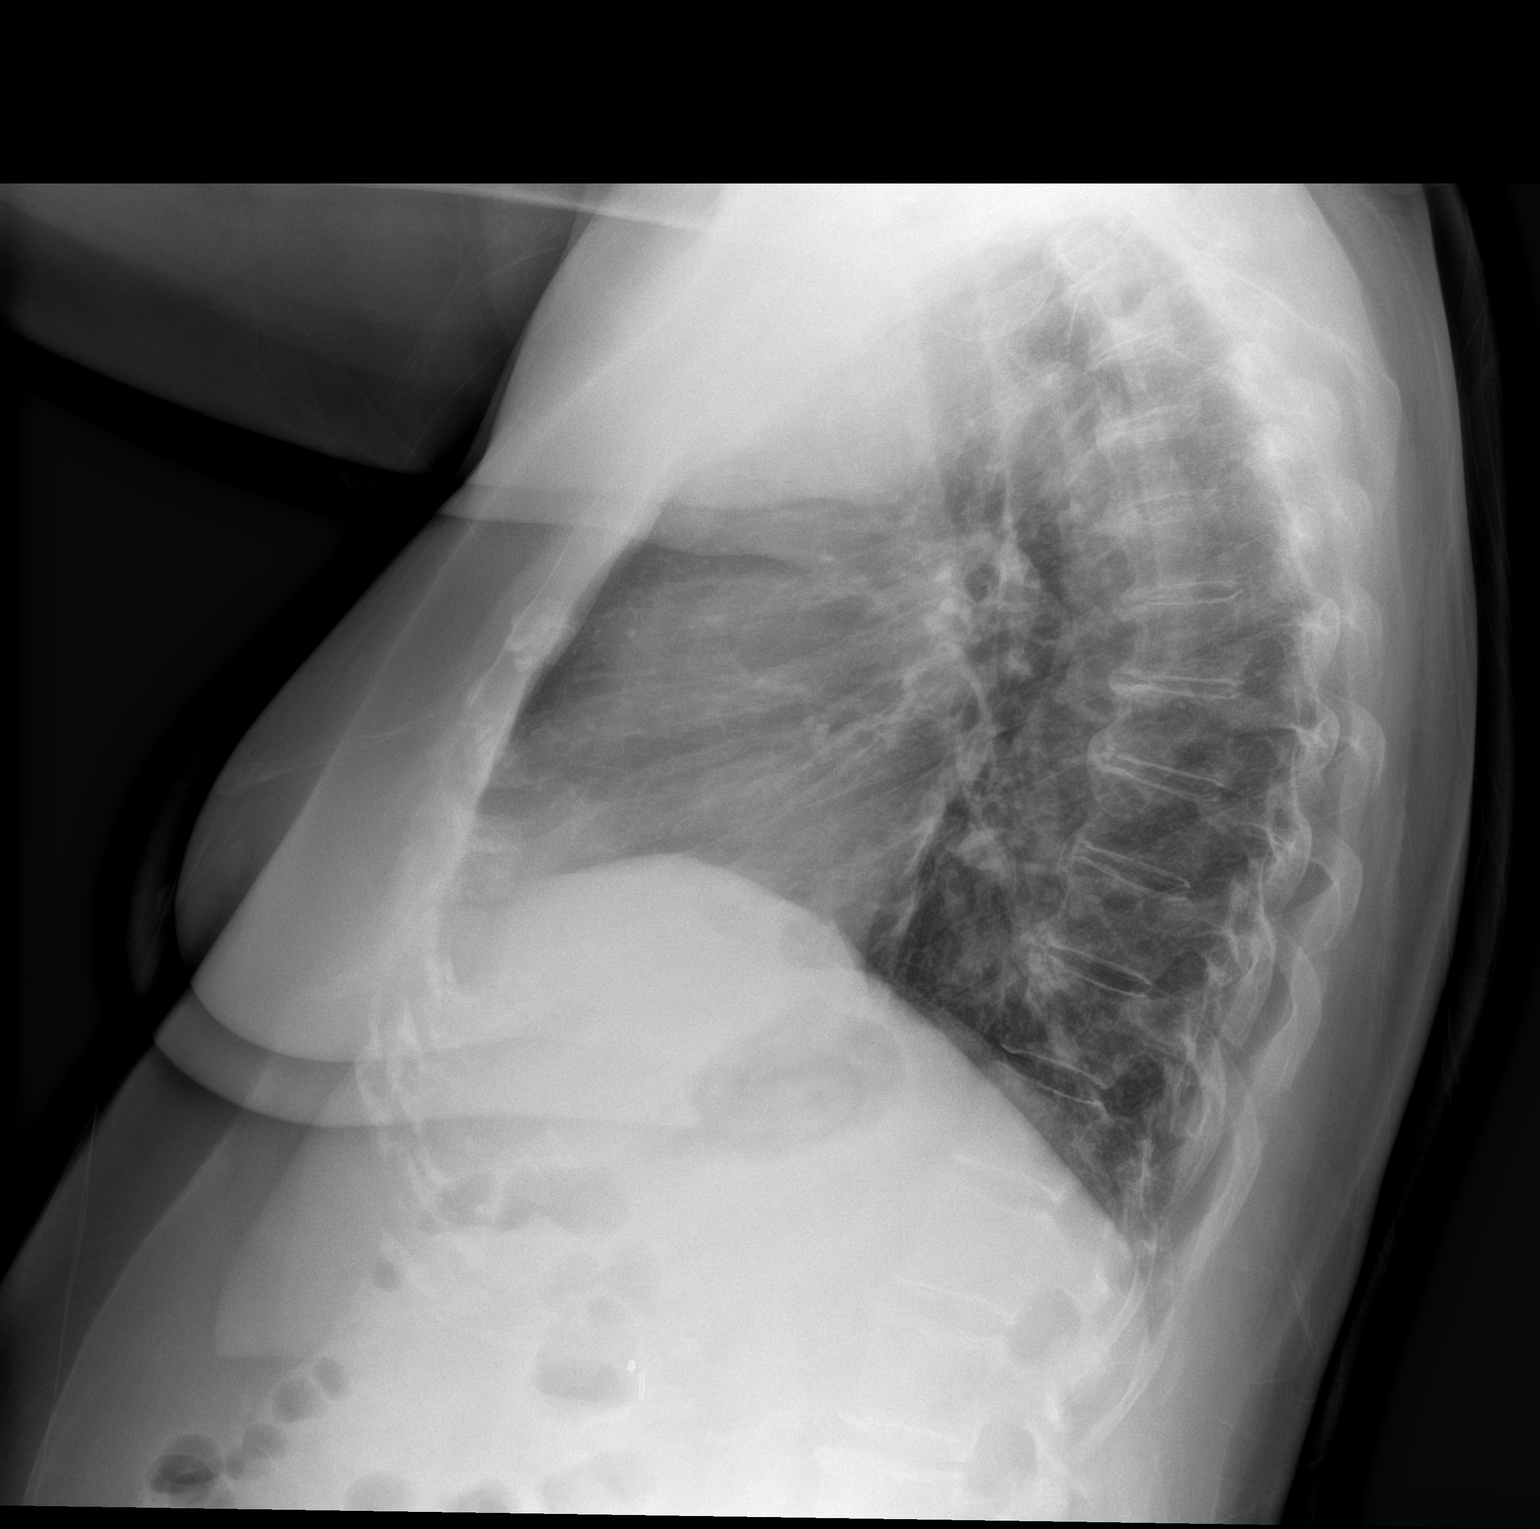

[2 of 2 positions shown; findings below may reference images not displayed]

FINDINGS: The heart size and mediastinal contours are within normal limits.
Minimal left basilar subsegmental atelectasis or scarring is noted.
Right lung is clear. The visualized skeletal structures are
unremarkable.
IMPRESSION: Minimal left basilar subsegmental atelectasis or scarring.

## 2022-12-29 ENCOUNTER — Ambulatory Visit (INDEPENDENT_AMBULATORY_CARE_PROVIDER_SITE_OTHER): Payer: Medicare Other | Admitting: Family Medicine

## 2022-12-29 ENCOUNTER — Encounter: Payer: Self-pay | Admitting: Family Medicine

## 2022-12-29 VITALS — BP 119/81 | HR 99 | Ht 67.0 in | Wt 204.0 lb

## 2022-12-29 DIAGNOSIS — Z780 Asymptomatic menopausal state: Secondary | ICD-10-CM | POA: Diagnosis not present

## 2022-12-29 DIAGNOSIS — M8580 Other specified disorders of bone density and structure, unspecified site: Secondary | ICD-10-CM

## 2022-12-29 DIAGNOSIS — H8110 Benign paroxysmal vertigo, unspecified ear: Secondary | ICD-10-CM

## 2022-12-31 ENCOUNTER — Other Ambulatory Visit: Payer: Self-pay | Admitting: Family Medicine

## 2023-01-04 DIAGNOSIS — H8110 Benign paroxysmal vertigo, unspecified ear: Secondary | ICD-10-CM | POA: Insufficient documentation

## 2023-01-04 NOTE — Assessment & Plan Note (Signed)
No middle ear effusion.  No significant neurological deficits noted.  Referral placed to physical therapy for vestibular rehab.  We discussed that she may use over-the-counter meclizine if needed for symptom control.

## 2023-01-04 NOTE — Progress Notes (Signed)
Rhonda Munoz - 67 y.o. female MRN [id number]  Date of birth: 1955-11-19  Subjective Chief Complaint  Patient presents with   Dizziness    HPI Rhonda Munoz is a 67 year old female here today with complaint of dizziness.  Had dizziness for a few days.  This is worse with sudden movements of her head.  She describes this as a spinning sensation.  Denies lightheaded feeling.  She has not had significant nausea.  She denies headache.  She has not any recent illness but has had some mild congestion recently.  She has not tried anything so far to help with her symptoms.  ROS:  A comprehensive ROS was completed and negative except as noted per HPI  Allergies  Allergen Reactions   Codeine     Nausea, Rash   Hydromorphone     Tachycardia, rash   Procaine     Nausea, Rash   Venlafaxine     VISUAL HALLUCINATIONS    Past Medical History:  Diagnosis Date   Depression    GERD (gastroesophageal reflux disease)    Hyperlipidemia     Past Surgical History:  Procedure Laterality Date   ABDOMINAL HYSTERECTOMY     FINGER SURGERY     GALLBLADDER SURGERY      Social History   Socioeconomic History   Marital status: Widowed    Spouse name: Not on file   Number of children: Not on file   Years of education: Not on file   Highest education level: Associate degree: academic program  Occupational History   Occupation: Retired  Tobacco Use   Smoking status: Never   Smokeless tobacco: Never  Vaping Use   Vaping Use: Never used  Substance and Sexual Activity   Alcohol use: Never   Drug use: Never   Sexual activity: Not on file  Other Topics Concern   Not on file  Social History Narrative   Not on file   Social Determinants of Health   Financial Resource Strain: Low Risk  (12/25/2022)   Overall Financial Resource Strain (CARDIA)    Difficulty of Paying Living Expenses: Not hard at all  Food Insecurity: No Food Insecurity (12/25/2022)   Hunger Vital Sign    Worried About Running Out  of Food in the Last Year: Never true    Ran Out of Food in the Last Year: Never true  Transportation Needs: No Transportation Needs (12/25/2022)   PRAPARE - Administrator, Civil Service (Medical): No    Lack of Transportation (Non-Medical): No  Physical Activity: Sufficiently Active (12/25/2022)   Exercise Vital Sign    Days of Exercise per Week: 5 days    Minutes of Exercise per Session: 30 min  Stress: No Stress Concern Present (12/25/2022)   [employer] of Occupational Health - Occupational Stress Questionnaire    Feeling of Stress : Not at all  Social Connections: Moderately Integrated (12/25/2022)   Social Connection and Isolation Panel [NHANES]    Frequency of Communication with Friends and Family: More than three times a week    Frequency of Social Gatherings with Friends and Family: More than three times a week    Attends Religious Services: More than 4 times per year    Active Member of [employer] or Organizations: Yes    Attends Banker Meetings: More than 4 times per year    Marital Status: Widowed    Family History  Problem Relation Age of Onset   Cancer Mother    Hypertension  Mother    Stroke Mother    Diabetes Father    Heart attack Father     Health Maintenance  Topic Date Due   COVID-19 Vaccine (4 - 2023-24 season) 04/25/2022   PAP SMEAR-Modifier  09/04/2022   COLONOSCOPY (Pts 45-68yrs Insurance coverage will need to be confirmed)  05/27/2023 (Originally 07/08/2003)   Hepatitis C Screening  05/27/2023 (Originally 07/07/1976)   HIV Screening  05/27/2023 (Originally 07/07/1973)   INFLUENZA VACCINE  03/26/2023   Medicare Annual Wellness (AWV)  05/27/2023   MAMMOGRAM  05/15/2024   DTaP/Tdap/Td (3 - Td or Tdap) 01/12/2025   Zoster Vaccines- Shingrix  Completed   HPV VACCINES  Aged Out      ----------------------------------------------------------------------------------------------------------------------------------------------------------------------------------------------------------------- Physical Exam BP 119/81 (BP Location: Left Arm, Patient Position: Sitting, Cuff Size: Large)   Pulse 99   Ht 5\' 7"  (1.702 m)   Wt 204 lb (92.5 kg)   SpO2 97%   BMI 31.95 kg/m   Physical Exam Constitutional:      Appearance: Normal appearance.  HENT:     Head: Normocephalic and atraumatic.  Eyes:     General: No scleral icterus. Cardiovascular:     Rate and Rhythm: Normal rate and regular rhythm.  Pulmonary:     Effort: Pulmonary effort is normal.     Breath sounds: Normal breath sounds.  Musculoskeletal:     Cervical back: Neck supple.  Neurological:     General: No focal deficit present.     Mental Status: She is alert and oriented to person, place, and time.     Cranial Nerves: No cranial nerve deficit.  Psychiatric:        Mood and Affect: Mood normal.        Behavior: Behavior normal.     ------------------------------------------------------------------------------------------------------------------------------------------------------------------------------------------------------------------- Assessment and Plan  Benign paroxysmal positional vertigo No middle ear effusion.  No significant neurological deficits noted.  Referral placed to physical therapy for vestibular rehab.  We discussed that she may use over-the-counter meclizine if needed for symptom control.   No orders of the defined types were placed in this encounter.   No follow-ups on file.    This visit occurred during the SARS-CoV-2 public health emergency.  Safety protocols were in place, including screening questions prior to the visit, additional usage of staff PPE, and extensive cleaning of exam room while observing appropriate contact time as indicated for disinfecting solutions.

## 2023-01-05 ENCOUNTER — Ambulatory Visit: Payer: Medicare Other | Attending: Family Medicine | Admitting: Rehabilitative and Restorative Service Providers"

## 2023-01-05 ENCOUNTER — Other Ambulatory Visit: Payer: Self-pay

## 2023-01-05 ENCOUNTER — Encounter: Payer: Self-pay | Admitting: Rehabilitative and Restorative Service Providers"

## 2023-01-05 DIAGNOSIS — H8110 Benign paroxysmal vertigo, unspecified ear: Secondary | ICD-10-CM | POA: Insufficient documentation

## 2023-01-05 DIAGNOSIS — H8112 Benign paroxysmal vertigo, left ear: Secondary | ICD-10-CM | POA: Diagnosis present

## 2023-01-05 DIAGNOSIS — H8111 Benign paroxysmal vertigo, right ear: Secondary | ICD-10-CM

## 2023-01-05 DIAGNOSIS — R42 Dizziness and giddiness: Secondary | ICD-10-CM | POA: Diagnosis present

## 2023-01-05 NOTE — Therapy (Signed)
OUTPATIENT PHYSICAL THERAPY VESTIBULAR EVALUATION   Patient Name: Rhonda Munoz MRN: [id number] DOB:1955-12-15, 67 y.o., female Today's Date: 01/05/2023  END OF SESSION:  PT End of Session - 01/05/23 0930     Visit Number 1    Number of Visits 8    Date for PT Re-Evaluation 02/04/23    Authorization Type medicare and tricare    Progress Note Due on Visit 10    PT Start Time 0931    PT Stop Time 1010    PT Time Calculation (min) 39 min    Activity Tolerance Patient tolerated treatment well    Behavior During Therapy [facility] for tasks assessed/performed            Past Medical History:  Diagnosis Date   Depression    GERD (gastroesophageal reflux disease)    Hyperlipidemia    Past Surgical History:  Procedure Laterality Date   ABDOMINAL HYSTERECTOMY     FINGER SURGERY     GALLBLADDER SURGERY     Patient Active Problem List   Diagnosis Date Noted   Benign paroxysmal positional vertigo 01/04/2023   Yeast dermatitis 07/02/2022   Depression 06/05/2021   Oral candidiasis 09/06/2020   Osteopenia 04/26/2020   Hypercalcemia 01/02/2020   Hyperlipemia 01/02/2020   Prediabetes 01/02/2020   Postmenopausal 12/13/2019   Knee pain, left 09/28/2017   Gastroesophageal reflux disease with esophagitis 05/18/2017   History of colon polyps 05/18/2017   PCP: Everrett Coombe, DO REFERRING PROVIDER: Everrett Coombe, DO REFERRING DIAG: H81.10 (ICD-10-CM) - Benign paroxysmal positional vertigo, unspecified laterality   THERAPY DIAG:  BPPV (benign paroxysmal positional vertigo), left  BPPV (benign paroxysmal positional vertigo), right  Dizziness and giddiness  ONSET DATE: 12/29/22 Rationale for Evaluation and Treatment: Rehabilitation  SUBJECTIVE:   SUBJECTIVE STATEMENT: The patient has had h/o intermittent vertigo. Her initial episode sounds like constant vertigo years ago and may be more consistent with a vestibular neuritis. This episode began approximately 6 weeks ago when  bending to get something out of a low cabinet. She describes this as a spinning sensation. She notes Zyrtec and head motions typically help her self treat the vertigo. She feels her eyes moving and that is what is most bothersome to her. She gets some imbalance.  Pt accompanied by: self PERTINENT HISTORY: Cateracts surgery (November, January), Stygmatism R eye,  PAIN:  Are you having pain? No PRECAUTIONS: None WEIGHT BEARING RESTRICTIONS: No FALLS: Has patient fallen in last 6 months? No LIVING ENVIRONMENT: Lives with: lives alone Lives in: House/apartment PLOF: Independent PATIENT GOALS: Reduce dizziness  OBJECTIVE:   POSTURE:  rounded shoulders  Cervical ROM:  WFLs for all movements  STRENGTH: not assessed  PATIENT SURVEYS:  FOTO n/a due to vestibular  VESTIBULAR ASSESSMENT:  GENERAL OBSERVATION: Moves independently without a device   SYMPTOM BEHAVIOR:  Subjective history: Sudden onset of dizziness 6 weeks ago  Non-Vestibular symptoms:  n/a  Type of dizziness: Spinning/Vertigo and sensation of eyes moving  Frequency: daily  Duration: seconds to minutes  Aggravating factors: Induced by position change: rolling to the right, rolling to the left, and looking up  Relieving factors: lying supine  Progression of symptoms: worse  OCULOMOTOR EXAM:  Ocular Alignment: normal  Ocular ROM: No Limitations  Spontaneous Nystagmus: absent  Gaze-Induced Nystagmus: absent  Smooth Pursuits: intact  Saccades: intact  *gets a sensation of "cloudiness" after R eye cateracts surgery in January  VESTIBULAR - OCULAR REFLEX:   Slow VOR: Normal at slow pace , but feels like  dizziness could come on  VOR Cancellation: Normal; with VOR cancellation-- patient moved with faster smooth pursuits and got a sense of "merry go round"  Head-Impulse Test: HIT Right: Positive for refixation saccade. HIT Left: positive and patient closes her eyes and gets a spinning sensation that lasts for  seconds  Dynamic Visual Acuity:  not assessed today   POSITIONAL TESTING: Right Dix-Hallpike: upbeating, right nystagmus Left Dix-Hallpike: upbeating, left nystagmus Right Roll Test: not tested today-- severe symptoms with bilat dix hallpike and know there is multi-canal bppv-- will treat posterior canals first and then horizontal canals Left Roll Test: to be further assessed.  [facility] Adult PT Treatment:                                                DATE: 01/05/23   Canalith Repositioning:  Epley Right: Number of Reps: 1  with nausea and need to sit in exam room>30 minutes to let symptoms settle to be able to drive  PATIENT EDUCATION: Education details: nature of BPPV-- and handout from [employer] Person educated: Patient Education method: Explanation and Handouts Education comprehension: verbalized understanding  HOME EXERCISE PROGRAM: None at eval  GOALS: Goals reviewed with patient? Yes  LONG TERM GOALS: Target date: 02/04/2023    Patient will be independent with progressed HEP to improve outcomes and carryover.  Baseline:  none established at eval Goal status: INITIAL  2.  Patient will report 80% improvement in dizziness. Baseline: dizziness limits daily activities Goal status: INITIAL  3.  The patient will have negative positional testing indicating resolution of BPPV. Baseline:  + bilateral dix hallpike-- could not test horizontal canal at eval due to severity of sxs. Goal status: INITIAL  4.  Patient will report resolution of dizziness rolling over in bed.  Baseline:  Notes dizziness with all bed mobility Goal status: INITIAL  ASSESSMENT:  CLINICAL IMPRESSION: Patient is a 67 y.o. female who was seen today for physical therapy evaluation and treatment for bilateral BPPV. In addition, the patient has + testing for head impulse testing indicating dec'd use of VOR. This may explain subjective complaints of visual shakiness with movement. PT to address positional symptoms and  then progress to gaze adaptation and balance activities as needed.    OBJECTIVE IMPAIRMENTS: decreased activity tolerance, decreased balance, dizziness, and impaired vision/preception.   ACTIVITY LIMITATIONS: bending and bed mobility  PARTICIPATION LIMITATIONS: driving and community activity  PERSONAL FACTORS: 1 comorbidity: prior h/o vertigo  are also affecting patient's functional outcome.   REHAB POTENTIAL: Good  CLINICAL DECISION MAKING: Stable/uncomplicated  EVALUATION COMPLEXITY: Low  PLAN:  PT FREQUENCY: 2x/week  PT DURATION: 4 weeks  PLANNED INTERVENTIONS: Therapeutic exercises, Therapeutic activity, Neuromuscular re-education, Balance training, Gait training, Patient/Family education, Self Care, Vestibular training, Canalith repositioning, Visual/preceptual remediation/compensation, and Manual therapy  PLAN FOR NEXT SESSION: reassess canals and treat BPPV, begin gaze adaptation and habituation as indicated, test balance.   Elius Etheredge, PT 01/05/2023, 1:02 PM

## 2023-01-12 ENCOUNTER — Ambulatory Visit: Payer: Medicare Other | Admitting: Rehabilitative and Restorative Service Providers"

## 2023-01-12 ENCOUNTER — Encounter: Payer: Self-pay | Admitting: Rehabilitative and Restorative Service Providers"

## 2023-01-12 DIAGNOSIS — H8112 Benign paroxysmal vertigo, left ear: Secondary | ICD-10-CM | POA: Diagnosis not present

## 2023-01-12 DIAGNOSIS — H8111 Benign paroxysmal vertigo, right ear: Secondary | ICD-10-CM

## 2023-01-12 DIAGNOSIS — R42 Dizziness and giddiness: Secondary | ICD-10-CM

## 2023-01-12 NOTE — Therapy (Signed)
OUTPATIENT PHYSICAL THERAPY VESTIBULAR TREATMENT  Patient Name: Rhonda Munoz MRN: [id number] DOB:03-Jan-1956, 67 y.o., female Today's Date: 01/12/2023  END OF SESSION:  PT End of Session - 01/12/23 1452     Visit Number 2    Number of Visits 8    Date for PT Re-Evaluation 02/04/23    Authorization Type medicare and tricare    Progress Note Due on Visit 10    PT Start Time 1451    PT Stop Time 1530    PT Time Calculation (min) 39 min    Activity Tolerance Patient tolerated treatment well    Behavior During Therapy WFL for tasks assessed/performed            Past Medical History:  Diagnosis Date   Depression    GERD (gastroesophageal reflux disease)    Hyperlipidemia    Past Surgical History:  Procedure Laterality Date   ABDOMINAL HYSTERECTOMY     FINGER SURGERY     GALLBLADDER SURGERY     Patient Active Problem List   Diagnosis Date Noted   Benign paroxysmal positional vertigo 01/04/2023   Yeast dermatitis 07/02/2022   Depression 06/05/2021   Oral candidiasis 09/06/2020   Osteopenia 04/26/2020   Hypercalcemia 01/02/2020   Hyperlipemia 01/02/2020   Prediabetes 01/02/2020   Postmenopausal 12/13/2019   Knee pain, left 09/28/2017   Gastroesophageal reflux disease with esophagitis 05/18/2017   History of colon polyps 05/18/2017   PCP: Everrett Coombe, DO REFERRING PROVIDER: Everrett Coombe, DO REFERRING DIAG: H81.10 (ICD-10-CM) - Benign paroxysmal positional vertigo, unspecified laterality   THERAPY DIAG:  BPPV (benign paroxysmal positional vertigo), left  BPPV (benign paroxysmal positional vertigo), right  Dizziness and giddiness  ONSET DATE: 12/29/22 Rationale for Evaluation and Treatment: Rehabilitation  SUBJECTIVE:   SUBJECTIVE STATEMENT: The patient felt bad for the entire afternoon after last Monday. She reports that she got a headache, had nausea, and had to rest all afternoon. She notices mild changes with rolling in bed, but dizziness is still  present. She has some days the sensation of dizziness never goes away. She gets vertigo with looking up.   EVAL: The patient has had h/o intermittent vertigo. Her initial episode sounds like constant vertigo years ago and may be more consistent with a vestibular neuritis. This episode began approximately 6 weeks ago when bending to get something out of a low cabinet. She describes this as a spinning sensation. She notes Zyrtec and head motions typically help her self treat the vertigo. She feels her eyes moving and that is what is most bothersome to her. She gets some imbalance.  Pt accompanied by: self PERTINENT HISTORY: Cateracts surgery (November, January), Stygmatism R eye,  PAIN:  Are you having pain? No PRECAUTIONS: None WEIGHT BEARING RESTRICTIONS: No FALLS: Has patient fallen in last 6 months? No LIVING ENVIRONMENT: Lives with: lives alone Lives in: House/apartment PLOF: Independent PATIENT GOALS: Reduce dizziness  OBJECTIVE:   POSTURE:  rounded shoulders  Cervical ROM:  WFLs for all movements  STRENGTH: not assessed  PATIENT SURVEYS:  FOTO n/a due to vestibular  VESTIBULAR ASSESSMENT: (Measures in this section from initial evaluation unless otherwise noted) GENERAL OBSERVATION: Moves independently without a device  SYMPTOM BEHAVIOR:  Subjective history: Sudden onset of dizziness 6 weeks ago  Non-Vestibular symptoms:  n/a  Type of dizziness: Spinning/Vertigo and sensation of eyes moving  Frequency: daily  Duration: seconds to minutes  Aggravating factors: Induced by position change: rolling to the right, rolling to the left, and looking up  Relieving factors: lying supine  Progression of symptoms: worse OCULOMOTOR EXAM:  Ocular Alignment: normal  Ocular ROM: No Limitations  Spontaneous Nystagmus: absent  Gaze-Induced Nystagmus: absent  Smooth Pursuits: intact  Saccades: intact  *gets a sensation of "cloudiness" after R eye cateracts surgery in  January VESTIBULAR - OCULAR REFLEX:   Slow VOR: Normal at slow pace , but feels like dizziness could come on  VOR Cancellation: Normal; with VOR cancellation-- patient moved with faster smooth pursuits and got a sense of "merry go round"  Head-Impulse Test: HIT Right: Positive for refixation saccade. 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  [facility] Adult PT Treatment:                                                DATE: 01/12/23 Neuromuscular re-ed: Right sidelying: mild sensation of dizziness no nystagmus viewed in room light Left sidelying: + for 10/10 spinning sensation with upbeat, rotary nystagmus x 20-30 seconds Retested L sidelying after epley's: no nystagmus or subjective dizziness Canolith Repositioning: L EPLEY'S maneuver: 1 repetition Self: Discussed long standing history of dec'd tolerance to visual motion-- PT explained sensory mismatch and issues with balance in visually stimulating environments.    [facility] Adult PT Treatment:                                                DATE: 01/05/23   Canalith Repositioning:  Epley Right: Number of Reps: 1  with nausea and need to sit in exam room>30 minutes to let symptoms settle to be able to drive  PATIENT EDUCATION: Education details: nature of BPPV-- and handout from [employer] Person educated: Patient Education method: Explanation and Handouts Education comprehension: verbalized understanding  HOME EXERCISE PROGRAM: None at eval  GOALS: Goals reviewed with patient? Yes  LONG TERM GOALS: Target date: 02/04/2023     Patient will be independent with progressed HEP to improve outcomes and carryover.  Baseline:  none established at eval Goal status: INITIAL  2.  Patient will report 80% improvement in dizziness. Baseline: dizziness limits daily activities Goal status: INITIAL  3.  The patient will have negative positional testing indicating resolution of BPPV. Baseline:  + bilateral dix hallpike-- could not test horizontal canal at eval due to severity of sxs. Goal status: INITIAL  4.  Patient will report resolution of dizziness rolling over in bed.  Baseline:  Notes dizziness with all bed mobility Goal status: INITIAL  ASSESSMENT:  CLINICAL IMPRESSION: The patient did not have severe symptoms to the R side today (after treating R side at eval). The patient continues with symptoms to the L side and PT treated today with Epley's maneuver. She gets mild nausea, but not nearly to the severity she experienced last week.  PT to follow up to check positional symptoms and initiate gaze x 1 and multi-sensory balance.  EVAL:  Patient is a 67 y.o. female who was seen today for physical therapy evaluation and treatment for bilateral BPPV. In addition, the patient has + testing for head impulse testing indicating dec'd use of VOR. This may explain subjective complaints of visual shakiness with movement. PT to address positional symptoms and then progress to gaze adaptation and balance activities as needed.    OBJECTIVE IMPAIRMENTS: decreased activity tolerance, decreased balance, dizziness, and impaired vision/preception.   PLAN:  PT FREQUENCY: 2x/week  PT DURATION: 4 weeks  PLANNED INTERVENTIONS: Therapeutic exercises, Therapeutic activity, Neuromuscular re-education, Balance training, Gait training, Patient/Family education, Self Care, Vestibular training, Canalith repositioning, Visual/preceptual remediation/compensation, and Manual therapy  PLAN FOR NEXT SESSION: reassess canals and treat BPPV, begin  gaze adaptation and habituation as indicated, test balance.   Antonina Deziel, PT 01/12/2023, 2:52 PM

## 2023-01-23 ENCOUNTER — Ambulatory Visit: Payer: Medicare Other | Admitting: Rehabilitative and Restorative Service Providers"

## 2023-01-23 ENCOUNTER — Encounter: Payer: Self-pay | Admitting: Rehabilitative and Restorative Service Providers"

## 2023-01-23 DIAGNOSIS — H8112 Benign paroxysmal vertigo, left ear: Secondary | ICD-10-CM | POA: Diagnosis not present

## 2023-01-23 DIAGNOSIS — R42 Dizziness and giddiness: Secondary | ICD-10-CM

## 2023-01-23 DIAGNOSIS — H8111 Benign paroxysmal vertigo, right ear: Secondary | ICD-10-CM

## 2023-01-23 NOTE — Therapy (Signed)
OUTPATIENT PHYSICAL THERAPY VESTIBULAR TREATMENT  Patient Name: Rhonda Munoz MRN: [id number] DOB:Jul 20, 1956, 67 y.o., female Today's Date: 01/23/2023  END OF SESSION:  PT End of Session - 01/23/23 0940     Visit Number 3    Number of Visits 8    Date for PT Re-Evaluation 02/04/23    Authorization Type medicare and tricare    Progress Note Due on Visit 10    PT Start Time [phone]    PT Stop Time 1018    PT Time Calculation (min) 40 min    Activity Tolerance Patient tolerated treatment well    Behavior During Therapy [facility] for tasks assessed/performed            Past Medical History:  Diagnosis Date   Depression    GERD (gastroesophageal reflux disease)    Hyperlipidemia    Past Surgical History:  Procedure Laterality Date   ABDOMINAL HYSTERECTOMY     FINGER SURGERY     GALLBLADDER SURGERY     Patient Active Problem List   Diagnosis Date Noted   Benign paroxysmal positional vertigo 01/04/2023   Yeast dermatitis 07/02/2022   Depression 06/05/2021   Oral candidiasis 09/06/2020   Osteopenia 04/26/2020   Hypercalcemia 01/02/2020   Hyperlipemia 01/02/2020   Prediabetes 01/02/2020   Postmenopausal 12/13/2019   Knee pain, left 09/28/2017   Gastroesophageal reflux disease with esophagitis 05/18/2017   History of colon polyps 05/18/2017   PCP: Everrett Coombe, DO REFERRING PROVIDER: Everrett Coombe, DO REFERRING DIAG: H81.10 (ICD-10-CM) - Benign paroxysmal positional vertigo, unspecified laterality   THERAPY DIAG:  BPPV (benign paroxysmal positional vertigo), left  BPPV (benign paroxysmal positional vertigo), right  Dizziness and giddiness  ONSET DATE: 12/29/22 Rationale for Evaluation and Treatment: Rehabilitation  SUBJECTIVE:   SUBJECTIVE STATEMENT: The patient feels 95% better. 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 She still has symptoms with rolling when she gets into bed.   EVAL: The patient has had h/o intermittent vertigo. Her initial episode sounds like  constant vertigo years ago and may be more consistent with a vestibular neuritis. This episode began approximately 6 weeks ago when bending to get something out of a low cabinet. She describes this as a spinning sensation. She notes Zyrtec and head motions typically help her self treat the vertigo. She feels her eyes moving and that is what is most bothersome to her. She gets some imbalance.  Pt accompanied by: self PERTINENT HISTORY: Cateracts surgery (November, January), Stygmatism R eye,  PAIN:  Are you having pain? No PRECAUTIONS: None WEIGHT BEARING RESTRICTIONS: No FALLS: Has patient fallen in last 6 months? No LIVING ENVIRONMENT: Lives with: lives alone Lives in: House/apartment PLOF: Independent PATIENT GOALS: Reduce dizziness  OBJECTIVE:  (Measures in this section from initial evaluation unless otherwise noted) POSTURE:  rounded shoulders Cervical ROM:  WFLs for all movements STRENGTH: not assessed PATIENT SURVEYS:  FOTO n/a due to vestibular VESTIBULAR ASSESSMENT: (Measures in this section from initial evaluation unless otherwise noted) GENERAL OBSERVATION: Moves independently without a device  SYMPTOM BEHAVIOR:  Subjective history: Sudden onset of dizziness 6 weeks ago  Non-Vestibular symptoms:  n/a  Type of dizziness: Spinning/Vertigo and sensation of eyes moving  Frequency: daily  Duration: seconds to minutes  Aggravating factors: Induced by position change: rolling to the right, rolling to the left, and looking up  Relieving factors: lying supine  Progression of symptoms: worse OCULOMOTOR EXAM:  Ocular Alignment: normal  Ocular ROM: No Limitations  Spontaneous Nystagmus: absent  Gaze-Induced Nystagmus:  absent  Smooth Pursuits: intact  Saccades: intact  *gets a sensation of "cloudiness" after R eye cateracts surgery in January VESTIBULAR - OCULAR REFLEX:   Slow VOR: Normal at slow pace , but feels like dizziness could come on  VOR Cancellation: Normal; with  VOR cancellation-- patient moved with faster smooth pursuits and got a sense of "merry go round"  Head-Impulse Test: HIT Right: Positive for refixation saccade. 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  [facility] Adult PT Treatment:                                                DATE: 01/23/23 Neuromuscular re-ed: L sidelying= no nystagmus or dizziness moving from sitting to sidelying Horizontal roll test provokes moderate to severe symptoms to the R side initial rep and improves with habituation x 4 total reps Gaze x 1 sitting x 10 reps x 2 sets-- on 2nd set, provokes nausea and patient needs rests and dec'd visual stimulation to recover Self Care: Discussed progression of gaze and demonstrated slower movement and small ROM to begin. Dec'd reps to 5x to begin and discussed frequency during the day.   [facility] Adult PT Treatment:                                                DATE: 01/12/23 Neuromuscular re-ed: Right sidelying: mild sensation of dizziness no nystagmus viewed in room light Left sidelying: + for 10/10 spinning sensation with upbeat, rotary nystagmus x 20-30 seconds Retested L sidelying after epley's: no nystagmus or subjective dizziness Canolith Repositioning: L EPLEY'S maneuver: 1 repetition Self: Discussed long standing history of dec'd tolerance to visual motion-- PT explained sensory mismatch and issues with balance in visually stimulating environments.    PATIENT EDUCATION: Education details: nature of BPPV-- and handout from [employer] Person educated:  Patient Education method: Explanation and Handouts Education comprehension: verbalized understanding  HOME EXERCISE PROGRAM:   Access Code: [email or web address] URL: [email or web address] Date: 01/23/2023 Prepared by: Margretta Ditty  Exercises - Rolling rightleft sides for vestibular habituation  - 2 x daily - 7 x weekly - 1 sets - 5 reps - Seated Gaze Stabilization with Head Rotation  - 2 x daily - 7 x weekly - 2 sets - 5 reps  GOALS: Goals reviewed with patient? Yes  LONG TERM GOALS: Target date: 02/04/2023    Patient will be independent with progressed HEP to improve outcomes and carryover.  Baseline:  none established at eval Goal status: INITIAL  2.  Patient will report 80% improvement in dizziness. Baseline: dizziness limits daily activities Goal status: INITIAL  3.  The patient will have negative positional testing indicating resolution of BPPV. Baseline:  + bilateral dix hallpike-- could not test horizontal canal at eval due to severity of sxs. Goal status: INITIAL  4.  Patient will report resolution of dizziness rolling over  in bed.  Baseline:  Notes dizziness with all bed mobility Goal status: INITIAL  ASSESSMENT:  CLINICAL IMPRESSION: The patient does not have any nystagmus noted in room light today, but continues with + symptoms with R rolling. PT progressed to add HEP today for habituation and also initiated VOR x 1 viewing. Plan to progress to tolerance.  EVAL: Patient is a 67 y.o. female who was seen today for physical therapy evaluation and treatment for bilateral BPPV. In addition, the patient has + testing for head impulse testing indicating dec'd use of VOR. This may explain subjective complaints of visual shakiness with movement. PT to address positional symptoms and then progress to gaze adaptation and balance activities as needed.    OBJECTIVE IMPAIRMENTS: decreased activity tolerance, decreased balance, dizziness, and impaired vision/preception.    PLAN:  PT FREQUENCY: 2x/week  PT DURATION: 4 weeks  PLANNED INTERVENTIONS: Therapeutic exercises, Therapeutic activity, Neuromuscular re-education, Balance training, Gait training, Patient/Family education, Self Care, Vestibular training, Canalith repositioning, Visual/preceptual remediation/compensation, and Manual therapy  PLAN FOR NEXT SESSION: reassess canals and treat BPPV, begin gaze adaptation and habituation as indicated, test balance.   Marcellis Frampton, PT 01/23/2023, 9:40 AM

## 2023-01-26 ENCOUNTER — Ambulatory Visit: Payer: Medicare Other | Attending: Family Medicine | Admitting: Rehabilitative and Restorative Service Providers"

## 2023-01-26 ENCOUNTER — Encounter: Payer: Self-pay | Admitting: Rehabilitative and Restorative Service Providers"

## 2023-01-26 DIAGNOSIS — R42 Dizziness and giddiness: Secondary | ICD-10-CM | POA: Diagnosis present

## 2023-01-26 DIAGNOSIS — H8111 Benign paroxysmal vertigo, right ear: Secondary | ICD-10-CM | POA: Insufficient documentation

## 2023-01-26 DIAGNOSIS — H8112 Benign paroxysmal vertigo, left ear: Secondary | ICD-10-CM | POA: Insufficient documentation

## 2023-01-26 NOTE — Therapy (Signed)
OUTPATIENT PHYSICAL THERAPY VESTIBULAR TREATMENT  Patient Name: Rhonda Munoz MRN: [id number] DOB:[birth year], 67 y.o., female Today's Date: 01/26/2023  END OF SESSION:  PT End of Session - 01/26/23 0932     Visit Number 4    Number of Visits 8    Date for PT Re-Evaluation 02/04/23    Authorization Type medicare and tricare    Progress Note Due on Visit 10    PT Start Time 0933    PT Stop Time 1011    PT Time Calculation (min) 38 min    Activity Tolerance Patient tolerated treatment well    Behavior During Therapy [facility] for tasks assessed/performed            Past Medical History:  Diagnosis Date   Depression    GERD (gastroesophageal reflux disease)    Hyperlipidemia    Past Surgical History:  Procedure Laterality Date   ABDOMINAL HYSTERECTOMY     FINGER SURGERY     GALLBLADDER SURGERY     Patient Active Problem List   Diagnosis Date Noted   Benign paroxysmal positional vertigo 01/04/2023   Yeast dermatitis 07/02/2022   Depression 06/05/2021   Oral candidiasis 09/06/2020   Osteopenia 04/26/2020   Hypercalcemia 01/02/2020   Hyperlipemia 01/02/2020   Prediabetes 01/02/2020   Postmenopausal 12/13/2019   Knee pain, left 09/28/2017   Gastroesophageal reflux disease with esophagitis 05/18/2017   History of colon polyps 05/18/2017   PCP: Everrett Coombe, DO REFERRING PROVIDER: Everrett Coombe, DO REFERRING DIAG: H81.10 (ICD-10-CM) - Benign paroxysmal positional vertigo, unspecified laterality  THERAPY DIAG:  BPPV (benign paroxysmal positional vertigo), left  BPPV (benign paroxysmal positional vertigo), right  Dizziness and giddiness  ONSET DATE: 12/29/22 Rationale for Evaluation and Treatment: Rehabilitation  SUBJECTIVE:   SUBJECTIVE STATEMENT: The patient reports no vertigo last night. She is doing the A exercise for gaze and rolling.She continues to get dizziness the first repetition.   EVAL: The patient has had h/o intermittent vertigo. Her initial episode  sounds like constant vertigo years ago and may be more consistent with a vestibular neuritis. This episode began approximately 6 weeks ago when bending to get something out of a low cabinet. She describes this as a spinning sensation. She notes Zyrtec and head motions typically help her self treat the vertigo. She feels her eyes moving and that is what is most bothersome to her. She gets some imbalance.  Pt accompanied by: self PERTINENT HISTORY: Cateracts surgery (November, January), Stygmatism R eye,  PAIN:  Are you having pain? No PRECAUTIONS: None WEIGHT BEARING RESTRICTIONS: No FALLS: Has patient fallen in last 6 months? No LIVING ENVIRONMENT: Lives with: lives alone Lives in: House/apartment PLOF: Independent PATIENT GOALS: Reduce dizziness  OBJECTIVE:  (Measures in this section from initial evaluation unless otherwise noted) POSTURE:  rounded shoulders Cervical ROM:  WFLs for all movements STRENGTH: not assessed PATIENT SURVEYS:  FOTO n/a due to vestibular VESTIBULAR ASSESSMENT: (Measures in this section from initial evaluation unless otherwise noted) GENERAL OBSERVATION: Moves independently without a device  SYMPTOM BEHAVIOR:  Subjective history: Sudden onset of dizziness 6 weeks ago  Non-Vestibular symptoms:  n/a  Type of dizziness: Spinning/Vertigo and sensation of eyes moving  Frequency: daily  Duration: seconds to minutes  Aggravating factors: Induced by position change: rolling to the right, rolling to the left, and looking up  Relieving factors: lying supine  Progression of symptoms: worse OCULOMOTOR EXAM:  Ocular Alignment: normal  Ocular ROM: No Limitations  Spontaneous Nystagmus: absent  Gaze-Induced Nystagmus: absent  Smooth Pursuits: intact  Saccades: intact  *gets a sensation of "cloudiness" after R eye cateracts surgery in January VESTIBULAR - OCULAR REFLEX:   Slow VOR: Normal at slow pace , but feels like dizziness could come on  VOR Cancellation:  Normal; with VOR cancellation-- patient moved with faster smooth pursuits and got a sense of "merry go round"  Head-Impulse Test: HIT Right: Positive for refixation saccade. 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  [facility] Adult PT Treatment:                                                DATE: 01/26/23 Neuromuscular re-ed: Gaze in standing-- used metronome to pace 60 bpm Corner balance Eyes closed feet narrow-- too challenging Eyes closed feet apart Feet narrow with horizontal head turns Feet narrow with vertical head turns Foam standing with feet apart with patient using visual fixation to tolerate position  [facility] Adult PT Treatment:                                                DATE: 01/23/23 Neuromuscular re-ed: L sidelying= no nystagmus or dizziness moving from sitting to sidelying Horizontal roll test provokes moderate to severe symptoms to the R side initial rep and improves with habituation x 4 total reps Gaze x 1 sitting x 10 reps x 2 sets-- on 2nd set, provokes nausea and patient needs rests and dec'd visual stimulation to recover Self Care: Discussed progression of gaze and demonstrated slower movement and small ROM to begin. Dec'd reps to 5x to begin and discussed frequency during the day.  [facility] Adult PT Treatment:                                                DATE: 01/12/23 Neuromuscular re-ed: Right sidelying: mild sensation of dizziness no nystagmus viewed in room light Left sidelying: + for 10/10 spinning sensation with upbeat, rotary nystagmus x 20-30  seconds Retested L sidelying after epley's: no nystagmus or subjective dizziness Canolith Repositioning: L EPLEY'S maneuver: 1 repetition Self: Discussed long standing history of dec'd tolerance to visual motion-- PT explained sensory mismatch and issues with balance in visually stimulating environments.   PATIENT EDUCATION: Education details: nature of BPPV-- and handout from [employer] Person educated: Patient Education method: Explanation and Handouts Education comprehension: verbalized understanding  HOME EXERCISE PROGRAM:  Access Code: [email or web address] URL: [email or web address] Date: 01/26/2023 Prepared by: Margretta Ditty  Exercises - Rolling rightleft sides for vestibular habituation  - 2 x daily - 7 x weekly - 1 sets - 5 reps - Standing Gaze Stabilization with Head Rotation  - 2 x daily - 7 x weekly - 1 sets - 1 reps - 30 seconds hold - Corner  Balance Feet Together: Eyes Open With Head Turns  - 2 x daily - 7 x weekly - 1 sets - 5 reps - Standing Balance in Corner with Eyes Closed  - 2 x daily - 7 x weekly - 1 sets - 3 reps - 15 seconds hold  GOALS: Goals reviewed with patient? Yes  LONG TERM GOALS: Target date: 02/04/2023    Patient will be independent with progressed HEP to improve outcomes and carryover.  Baseline:  none established at eval Goal status: MET ON 01/26/23  2.  Patient will report 80% improvement in dizziness. Baseline: dizziness limits daily activities Goal status: INITIAL  3.  The patient will have negative positional testing indicating resolution of BPPV. Baseline:  + bilateral dix hallpike-- could not test horizontal canal at eval due to severity of sxs. Goal status: INITIAL  4.  Patient will report resolution of dizziness rolling over in bed.  Baseline:  Notes dizziness with all bed mobility Goal status: INITIAL  ASSESSMENT:  CLINICAL IMPRESSION: The patient continues with dec'd multi-sensory balance deficits showing dependence on visual  cues. With closing eyes, she gets some nausea and worse imbalance. PT to progress to tolerance.  EVAL: Patient is a 67 y.o. female who was seen today for physical therapy evaluation and treatment for bilateral BPPV. In addition, the patient has + testing for head impulse testing indicating dec'd use of VOR. This may explain subjective complaints of visual shakiness with movement. PT to address positional symptoms and then progress to gaze adaptation and balance activities as needed.    OBJECTIVE IMPAIRMENTS: decreased activity tolerance, decreased balance, dizziness, and impaired vision/preception.   PLAN:  PT FREQUENCY: 2x/week  PT DURATION: 4 weeks  PLANNED INTERVENTIONS: Therapeutic exercises, Therapeutic activity, Neuromuscular re-education, Balance training, Gait training, Patient/Family education, Self Care, Vestibular training, Canalith repositioning, Visual/preceptual remediation/compensation, and Manual therapy  PLAN FOR NEXT SESSION: progress to tolerance. Add visual motion training program.   Robbin Loughmiller, PT 01/26/2023, 9:32 AM

## 2023-01-31 ENCOUNTER — Encounter: Payer: Self-pay | Admitting: Family Medicine

## 2023-02-02 MED ORDER — CETIRIZINE HCL 10 MG PO TABS: 10.0000 mg | ORAL_TABLET | Freq: Every day | ORAL | 3 refills | Status: DC

## 2023-02-02 NOTE — Telephone Encounter (Signed)
Completed.

## 2023-02-04 ENCOUNTER — Other Ambulatory Visit: Payer: Medicare Other

## 2023-02-09 ENCOUNTER — Ambulatory Visit: Payer: Medicare Other | Admitting: Rehabilitative and Restorative Service Providers"

## 2023-02-09 ENCOUNTER — Encounter: Payer: Self-pay | Admitting: Rehabilitative and Restorative Service Providers"

## 2023-02-09 DIAGNOSIS — H8112 Benign paroxysmal vertigo, left ear: Secondary | ICD-10-CM

## 2023-02-09 DIAGNOSIS — H8111 Benign paroxysmal vertigo, right ear: Secondary | ICD-10-CM

## 2023-02-09 DIAGNOSIS — R42 Dizziness and giddiness: Secondary | ICD-10-CM

## 2023-02-09 NOTE — Therapy (Signed)
OUTPATIENT PHYSICAL THERAPY VESTIBULAR TREATMENT  Patient Name: Rhonda Munoz MRN: [id number] DOB:July 27, 1956, 67 y.o., female Today's Date: 02/09/2023  END OF SESSION:  PT End of Session - 02/09/23 1101     Visit Number 5    Number of Visits 8    Date for PT Re-Evaluation 02/04/23    Authorization Type medicare and tricare    Progress Note Due on Visit 10    PT Start Time 1101    PT Stop Time 1143    PT Time Calculation (min) 42 min    Activity Tolerance Patient tolerated treatment well    Behavior During Therapy WFL for tasks assessed/performed             Past Medical History:  Diagnosis Date   Depression    GERD (gastroesophageal reflux disease)    Hyperlipidemia    Past Surgical History:  Procedure Laterality Date   ABDOMINAL HYSTERECTOMY     FINGER SURGERY     GALLBLADDER SURGERY     Patient Active Problem List   Diagnosis Date Noted   Benign paroxysmal positional vertigo 01/04/2023   Yeast dermatitis 07/02/2022   Depression 06/05/2021   Oral candidiasis 09/06/2020   Osteopenia 04/26/2020   Hypercalcemia 01/02/2020   Hyperlipemia 01/02/2020   Prediabetes 01/02/2020   Postmenopausal 12/13/2019   Knee pain, left 09/28/2017   Gastroesophageal reflux disease with esophagitis 05/18/2017   History of colon polyps 05/18/2017   PCP: Everrett Coombe, DO REFERRING PROVIDER: Everrett Coombe, DO REFERRING DIAG: H81.10 (ICD-10-CM) - Benign paroxysmal positional vertigo, unspecified laterality  THERAPY DIAG:  BPPV (benign paroxysmal positional vertigo), left  BPPV (benign paroxysmal positional vertigo), right  Dizziness and giddiness  ONSET DATE: 12/29/22 Rationale for Evaluation and Treatment: Rehabilitation  SUBJECTIVE:   SUBJECTIVE STATEMENT: The patient was progressing well with home exercises and increases the repetitions. She had a bad episode on Friday when getting her hair washed and turning her head to the left. The sensation that vertigo may come on  was present for the remainder of that day, but exercises were fine since then.   EVAL: The patient has had h/o intermittent vertigo. Her initial episode sounds like constant vertigo years ago and may be more consistent with a vestibular neuritis. This episode began approximately 6 weeks ago when bending to get something out of a low cabinet. She describes this as a spinning sensation. She notes Zyrtec and head motions typically help her self treat the vertigo. She feels her eyes moving and that is what is most bothersome to her. She gets some imbalance.  Pt accompanied by: self PERTINENT HISTORY: Cateracts surgery (November, January), Stygmatism R eye,  PAIN:  Are you having pain? No PRECAUTIONS: None WEIGHT BEARING RESTRICTIONS: No FALLS: Has patient fallen in last 6 months? No LIVING ENVIRONMENT: Lives with: lives alone Lives in: House/apartment PLOF: Independent PATIENT GOALS: Reduce dizziness  OBJECTIVE:  (Measures in this section from initial evaluation unless otherwise noted) POSTURE:  rounded shoulders Cervical ROM:  WFLs for all movements STRENGTH: not assessed PATIENT SURVEYS:  FOTO n/a due to vestibular VESTIBULAR ASSESSMENT: (Measures in this section from initial evaluation unless otherwise noted) GENERAL OBSERVATION: Moves independently without a device  SYMPTOM BEHAVIOR:  Subjective history: Sudden onset of dizziness 6 weeks ago  Non-Vestibular symptoms:  n/a  Type of dizziness: Spinning/Vertigo and sensation of eyes moving  Frequency: daily  Duration: seconds to minutes  Aggravating factors: Induced by position change: rolling to the right, rolling to the left, and looking up  Relieving factors: lying supine  Progression of symptoms: worse OCULOMOTOR EXAM:  Ocular Alignment: normal  Ocular ROM: No Limitations  Spontaneous Nystagmus: absent  Gaze-Induced Nystagmus: absent  Smooth Pursuits: intact  Saccades: intact  *gets a sensation of "cloudiness" after R eye  cateracts surgery in January VESTIBULAR - OCULAR REFLEX:   Slow VOR: Normal at slow pace , but feels like dizziness could come on  VOR Cancellation: Normal; with VOR cancellation-- patient moved with faster smooth pursuits and got a sense of "merry go round"  Head-Impulse Test: HIT Right: Positive for refixation saccade. 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  [facility] Adult PT Treatment:                                                DATE: 02/09/23 Neuromuscular re-ed: L sidelying=no dizziness, return to sitting provokes a sensation "it wants to start", but it's not spinning R sidelying= positive for dizziness and spinning that lasts for seconds and she closes her eyes and return to sitting Self epley's R side to begin educating on self treatment of BPPV X 2 reps with rest in between due to increased symptoms Self Care: Self Epley's for home management of recurring BPPV   [facility] Adult PT Treatment:                                                DATE: 01/26/23 Neuromuscular re-ed: Gaze in standing-- used metronome to pace 60 bpm Corner balance Eyes closed feet narrow-- too challenging Eyes closed feet apart Feet narrow with horizontal head turns Feet narrow with vertical head turns Foam standing with feet apart with patient using visual fixation to tolerate position  [facility] Adult PT Treatment:                                                DATE: 01/23/23 Neuromuscular re-ed: L sidelying= no nystagmus or dizziness moving from sitting to sidelying Horizontal roll test  provokes moderate to severe symptoms to the R side initial rep and improves with habituation x 4 total reps Gaze x 1 sitting x 10 reps x 2 sets-- on 2nd set, provokes nausea and patient needs rests and dec'd visual stimulation to recover Self Care: Discussed progression of gaze and demonstrated slower movement and small ROM to begin. Dec'd reps to 5x to begin and discussed frequency during the day.  [facility] Adult PT Treatment:                                                DATE: 01/12/23 Neuromuscular re-ed: Right sidelying: mild  sensation of dizziness no nystagmus viewed in room light Left sidelying: + for 10/10 spinning sensation with upbeat, rotary nystagmus x 20-30 seconds Retested L sidelying after epley's: no nystagmus or subjective dizziness Canolith Repositioning: L EPLEY'S maneuver: 1 repetition Self: Discussed long standing history of dec'd tolerance to visual motion-- PT explained sensory mismatch and issues with balance in visually stimulating environments.   PATIENT EDUCATION: Education details: nature of BPPV-- and handout from [employer] Person educated: Patient Education method: Explanation and Handouts Education comprehension: verbalized understanding  HOME EXERCISE PROGRAM:  Access Code: [email or web address] URL: [email or web address] Date: 01/26/2023 Prepared by: Margretta Ditty  Exercises - Rolling rightleft sides for vestibular habituation  - 2 x daily - 7 x weekly - 1 sets - 5 reps - Standing Gaze Stabilization with Head Rotation  - 2 x daily - 7 x weekly - 1 sets - 1 reps - 30 seconds hold - Corner Balance Feet Together: Eyes Open With Head Turns  - 2 x daily - 7 x weekly - 1 sets - 5 reps - Standing Balance in Corner with Eyes Closed  - 2 x daily - 7 x weekly - 1 sets - 3 reps - 15 seconds hold  GOALS: Goals reviewed with patient? Yes  LONG TERM GOALS: Target date: 03/10/23 *updated LTG date since recurrence of R BPPV.  Patient will be independent with progressed  HEP to improve outcomes and carryover.  Baseline:  none established at eval Goal status: MET ON 01/26/23  2.  Patient will report 80% improvement in dizziness. Baseline: dizziness limits daily activities Goal status: INITIAL  3.  The patient will have negative positional testing indicating resolution of BPPV. Baseline:  + bilateral dix hallpike-- could not test horizontal canal at eval due to severity of sxs. Goal status: INITIAL  4.  Patient will report resolution of dizziness rolling over in bed.  Baseline:  Notes dizziness with all bed mobility Goal status: INITIAL  ASSESSMENT:  CLINICAL IMPRESSION: The patient has R BPPV today identified with + R sidelying test. Due to recurrence so soon, PT initiated treatment for self Epley's at home and performed x 2 reps. We discussed how to know which side to treat and a handout provided with video links for you tube video to follow. Patient with improved symptoms on 2nd repetition.  EVAL: Patient is a 67 y.o. female who was seen today for physical therapy evaluation and treatment for bilateral BPPV. In addition, the patient has + testing for head impulse testing indicating dec'd use of VOR. This may explain subjective complaints of visual shakiness with movement. PT to address positional symptoms and then progress to gaze adaptation and balance activities as needed.    OBJECTIVE IMPAIRMENTS: decreased activity tolerance, decreased balance, dizziness, and impaired vision/preception.   PLAN:  PT FREQUENCY: 2x/week  PT DURATION: 4 weeks  PLANNED INTERVENTIONS: Therapeutic exercises, Therapeutic activity, Neuromuscular re-education, Balance training, Gait training, Patient/Family education, Self Care, Vestibular training, Canalith repositioning, Visual/preceptual remediation/compensation, and Manual therapy  PLAN FOR NEXT SESSION: progress to tolerance. Add visual motion training program. *Reassess BPPV and treat as  indicated.   Akiah Bauch, PT 02/09/2023, 11:01 AM

## 2023-02-09 NOTE — Patient Instructions (Signed)
How to know which side to treat:  Move into right sidelying. If you get dizzy lying down to the right, you want to do R epley's. If you get dizzy lying down to the left, you want to do L epley's.   Right Epley's at home Right Epley (short version) for Home ([email or web address]) Left Epley's at home Left Epley (short version) for Home-BPPV ([email or web address])

## 2023-02-16 ENCOUNTER — Ambulatory Visit: Payer: Medicare Other | Admitting: Rehabilitative and Restorative Service Providers"

## 2023-02-17 ENCOUNTER — Ambulatory Visit (INDEPENDENT_AMBULATORY_CARE_PROVIDER_SITE_OTHER): Payer: Medicare Other | Admitting: Nurse Practitioner

## 2023-02-17 ENCOUNTER — Encounter: Payer: Self-pay | Admitting: Nurse Practitioner

## 2023-02-17 VITALS — BP 124/86 | HR 96 | Ht 66.5 in | Wt 207.5 lb

## 2023-02-17 DIAGNOSIS — K219 Gastro-esophageal reflux disease without esophagitis: Secondary | ICD-10-CM | POA: Diagnosis not present

## 2023-02-17 DIAGNOSIS — Z800 Family history of malignant neoplasm of digestive organs: Secondary | ICD-10-CM | POA: Diagnosis not present

## 2023-02-17 DIAGNOSIS — Z86010 Personal history of colonic polyps: Secondary | ICD-10-CM

## 2023-02-17 MED ORDER — CLENPIQ 10-3.5-12 MG-GM -GM/160ML PO SOLN: 1.0000 | ORAL | 0 refills | Status: DC

## 2023-02-17 NOTE — Progress Notes (Signed)
Primary GI: New - transfer of care accepted by  Doristine Locks, MD  Assessment / Plan   67 y.o. yo female with a past medical history consisting of, but not necessarily limited to diverticulosis, colon polyps, family history colon cancer in a primary relative less than 57 years old.  Patient referred by PCP for colon cancer screening  History of adenomatous colon polyps ( 2014), colorectal cancer in primary relative (sister) under the age of 79.  Reportedly had a 5 year surveillance colonoscopy in 2019 with [facility] (unable to locate the report in Care Everywhere).   -She is due for 5-year surveillance colonoscopy based on family history -Schedule for a colonoscopy. The risks and benefits of colonoscopy with possible polypectomy / biopsies were discussed and the patient agrees to proceed.   EGD without Barrett's esophagus. Controlled on daily PPI -continue daily Pantoprazole.    History of Present Illness   Chief Complaint: Family history of colon polyps and family history colon cancer   Patient previously followed by GI with [facility] in [place], [place].  Had an EGD in 2019. She feels certain that she had a colonoscopy done at the same time , and GI office note 11/11/2017 mention that she was due for a colonoscopy but I but I cannot locate report.  I was able to locate the path report from 2014 and she had a tubular adenoma removed from the rectum. Patient has moved to [facility] and would like to establish GI care locally.  Dr. Barron Alvine accepted the transfer of care.  She has a strong family history of colon polyps and colon cancer in sister < 1 years of age. Candence has no GI complaints.   Previous Endoscopies / Labs /  Imaging   Colonoscopy November 2014 at [facility] -Unable to locate but did find path report FINAL DIAGNOSIS  Rectal colon polyp:       Adenomatous polyp (tubular adenoma).  s-fbb/07-06-13     April 2019 EGD at [facility] -Hypopharynx was normal.  The examined esophagus was  normal.  A small hiatal hernia was present.  A few 5 mm sessile polyps with no bleeding and no stigmata of recent bleeding were found in the greater curvature of the stomach.  The polyp was removed.  Localized mild inflammation characterized by erythema and friability was found in the gastric antrum.  Biopsies were taken.  CLO test negative.  Third portion of the duodenum was normal Path - benign fundic gland polyp  Past Medical History:  Diagnosis Date   Depression    GERD (gastroesophageal reflux disease)    Hyperlipidemia    Past Surgical History:  Procedure Laterality Date   ABDOMINAL HYSTERECTOMY     FINGER SURGERY     GALLBLADDER SURGERY     Family History  Problem Relation Age of Onset   Cancer Mother    Hypertension Mother    Stroke Mother    Diabetes Father    Heart attack Father    Social History   Tobacco Use   Smoking status: Never   Smokeless tobacco: Never  Vaping Use   Vaping Use: Never used  Substance Use Topics   Alcohol use: Never   Drug use: Never   Current Outpatient Medications  Medication Sig Dispense Refill   Ascorbic Acid (VITAMIN C) 1000 MG tablet      aspirin 81 MG EC tablet Take 81 mg by mouth daily.     Calcium Carbonate-Vitamin D (CALCIUM 600 +D HIGH POTENCY PO) Take  600 mg by mouth daily.     cetirizine (ZYRTEC ALLERGY) 10 MG tablet Take 1 tablet (10 mg total) by mouth daily. 90 tablet 3   desvenlafaxine (PRISTIQ) 100 MG 24 hr tablet Take 1 tablet (100 mg total) by mouth daily. 90 tablet 3   Magnesium Glycinate 100 MG CAPS Take 100 mg by mouth daily at 6 (six) AM.     Magnesium Oxide 250 MG TABS Take by mouth.     Multiple Vitamins-Minerals (DAILY MULTIVITAMIN PO) Take by mouth.     pantoprazole (PROTONIX) 40 MG tablet TAKE 1 TABLET DAILY 90 tablet 3   pravastatin (PRAVACHOL) 20 MG tablet Take 1 tablet (20 mg total) by mouth every evening. 90 tablet 3   No current facility-administered medications for this visit.   Allergies  Allergen  Reactions   Codeine     Nausea, Rash   Hydromorphone     Tachycardia, rash   Procaine     Nausea, Rash   Venlafaxine     VISUAL HALLUCINATIONS     Review of Systems: All systems reviewed and negative except where noted in HPI.   Wt Readings from Last 3 Encounters:  12/29/22 204 lb (92.5 kg)  09/24/22 207 lb 5 oz (94 kg)  07/21/22 210 lb (95.3 kg)    Physical Exam:  BP 124/86 (BP Location: Left Arm, Patient Position: Sitting, Cuff Size: Large)   Pulse 96   Ht 5' 6.5" (1.689 m)   Wt 207 lb 8 oz (94.1 kg)   BMI 32.99 kg/m  Constitutional:  Pleasant, generally well appearing female in no acute distress. Psychiatric:  Normal mood and affect. Behavior is normal. EENT: Pupils normal.  Conjunctivae are normal. No scleral icterus. Neck supple.  Cardiovascular: Normal rate, regular rhythm.  Pulmonary/chest: Effort normal and breath sounds normal. No wheezing, rales or rhonchi. Abdominal: Soft, nondistended, nontender. Bowel sounds active throughout. There are no masses palpable. No hepatomegaly. Neurological: Alert and oriented to person place and time.  Willette Cluster, NP  02/17/2023, 1:27 PM  Cc:  Referring Provider Everrett Coombe, DO

## 2023-02-17 NOTE — Patient Instructions (Signed)
_______________________________________________________  If your blood pressure at your visit was 140/90 or greater, please contact your primary care physician to follow up on this.  If you are age 65 or younger, your body mass index should be between 19-25. Your Body mass index is 32.99 kg/m. If this is out of the aformentioned range listed, please consider follow up with your Primary Care Provider.  ________________________________________________________  The [facility] providers would like to encourage you to use [facility] to communicate with providers for non-urgent requests or questions.  Due to long hold times on the telephone, sending your provider a message by [facility] may be a faster and more efficient way to get a response.  Please allow 48 business hours for a response.  Please remember that this is for non-urgent requests.  _______________________________________________________  Bonita Quin have been scheduled for a colonoscopy. Please follow written instructions given to you at your visit today.  Please pick up your prep supplies at the pharmacy within the next 1-3 days. If you use inhalers (even only as needed), please bring them with you on the day of your procedure.  Due to recent changes in healthcare laws, you may see the results of your imaging and laboratory studies on MyChart before your provider has had a chance to review them.  We understand that in some cases there may be results that are confusing or concerning to you. Not all laboratory results come back in the same time frame and the provider may be waiting for multiple results in order to interpret others.  Please give [place] 48 hours in order for your provider to thoroughly review all the results before contacting the office for clarification of your results.   Thank you for entrusting me with your care and choosing [facility].  Gunnar Fusi, NP

## 2023-02-18 ENCOUNTER — Other Ambulatory Visit: Payer: Medicare Other

## 2023-02-20 ENCOUNTER — Encounter: Payer: Self-pay | Admitting: Nurse Practitioner

## 2023-02-24 NOTE — Progress Notes (Signed)
Agree with the assessment and plan as outlined by Paula Guenther, NP. ° °Vandella Ord, DO, FACG ° °

## 2023-03-16 ENCOUNTER — Telehealth: Payer: Self-pay | Admitting: Nurse Practitioner

## 2023-03-16 NOTE — Telephone Encounter (Signed)
Patient rescheduled for procedure due to care partner being unavailable. Please provide updated prep instructions.   Thank you

## 2023-03-18 NOTE — Telephone Encounter (Signed)
Patient advised that new prep instructions have been sent to her MyChart for her to review.  Patient advised to contact our office with any future questions or concerns.  Patient agreed and had no further questions.

## 2023-03-31 ENCOUNTER — Encounter: Payer: Medicare Other | Admitting: Gastroenterology

## 2023-04-03 ENCOUNTER — Other Ambulatory Visit: Payer: Self-pay | Admitting: Family Medicine

## 2023-04-03 DIAGNOSIS — Z1231 Encounter for screening mammogram for malignant neoplasm of breast: Secondary | ICD-10-CM

## 2023-04-08 ENCOUNTER — Other Ambulatory Visit: Payer: Medicare Other

## 2023-04-22 ENCOUNTER — Ambulatory Visit (INDEPENDENT_AMBULATORY_CARE_PROVIDER_SITE_OTHER): Payer: Medicare Other

## 2023-04-22 DIAGNOSIS — Z780 Asymptomatic menopausal state: Secondary | ICD-10-CM

## 2023-04-22 DIAGNOSIS — M8580 Other specified disorders of bone density and structure, unspecified site: Secondary | ICD-10-CM

## 2023-04-23 ENCOUNTER — Encounter: Payer: Self-pay | Admitting: Family Medicine

## 2023-04-30 ENCOUNTER — Telehealth: Payer: Self-pay | Admitting: Family Medicine

## 2023-04-30 DIAGNOSIS — H8110 Benign paroxysmal vertigo, unspecified ear: Secondary | ICD-10-CM

## 2023-04-30 NOTE — Telephone Encounter (Signed)
Referral entered  

## 2023-04-30 NOTE — Telephone Encounter (Signed)
Received incoming voicemail from patient requesting new referral for physical therapy at [facility] for positional vertigo. Please advise.   [phone]

## 2023-05-04 NOTE — Telephone Encounter (Signed)
05/04/2023-Left message on patients voicemail that referral has been faxed to [facility] and office will contact patient to schedule referral appointment.

## 2023-05-04 NOTE — Telephone Encounter (Signed)
Referral and clinical notes have been faxed to [facility] through [employer]. Office will contact patient to schedule referral appointment.

## 2023-05-11 ENCOUNTER — Encounter: Payer: Self-pay | Admitting: Rehabilitative and Restorative Service Providers"

## 2023-05-11 ENCOUNTER — Ambulatory Visit: Payer: Medicare Other | Attending: Family Medicine | Admitting: Rehabilitative and Restorative Service Providers"

## 2023-05-11 ENCOUNTER — Other Ambulatory Visit: Payer: Self-pay

## 2023-05-11 DIAGNOSIS — H8110 Benign paroxysmal vertigo, unspecified ear: Secondary | ICD-10-CM | POA: Diagnosis present

## 2023-05-11 DIAGNOSIS — R42 Dizziness and giddiness: Secondary | ICD-10-CM | POA: Insufficient documentation

## 2023-05-11 DIAGNOSIS — H8112 Benign paroxysmal vertigo, left ear: Secondary | ICD-10-CM | POA: Diagnosis present

## 2023-05-11 DIAGNOSIS — H8111 Benign paroxysmal vertigo, right ear: Secondary | ICD-10-CM | POA: Diagnosis present

## 2023-05-11 NOTE — Therapy (Signed)
OUTPATIENT PHYSICAL THERAPY VESTIBULAR EVALUATION   Patient Name: Rhonda Munoz MRN: [id number] DOB:1956-05-06, 67 y.o., female Today's Date: 05/11/2023  END OF SESSION:  PT End of Session - 05/11/23 1020     Visit Number 1    Number of Visits 8    Date for PT Re-Evaluation 07/10/23    Authorization Type medicare and tricare    Progress Note Due on Visit 10    PT Start Time 1018    PT Stop Time 1100    PT Time Calculation (min) 42 min    Activity Tolerance Patient tolerated treatment well    Behavior During Therapy WFL for tasks assessed/performed             Past Medical History:  Diagnosis Date   Depression    GERD (gastroesophageal reflux disease)    Hemorrhoids    Hyperlipidemia    Past Surgical History:  Procedure Laterality Date   ABDOMINAL HYSTERECTOMY     FINGER SURGERY Right    index finger x 1, middle finger x 2   GALLBLADDER SURGERY     TONSILLECTOMY     TUBAL LIGATION     Patient Active Problem List   Diagnosis Date Noted   Benign paroxysmal positional vertigo 01/04/2023   Yeast dermatitis 07/02/2022   Depression 06/05/2021   Oral candidiasis 09/06/2020   Osteopenia 04/26/2020   Hypercalcemia 01/02/2020   Hyperlipemia 01/02/2020   Prediabetes 01/02/2020   Postmenopausal 12/13/2019   Knee pain, left 09/28/2017   Gastroesophageal reflux disease with esophagitis 05/18/2017   History of colon polyps 05/18/2017   PCP: Everrett Coombe, DO REFERRING PROVIDER: Everrett Coombe, DO REFERRING DIAG: H81.10 (ICD-10-CM) - Benign paroxysmal positional vertigo, unspecified laterality  THERAPY DIAG:  Dizziness and giddiness  ONSET DATE: 04/30/2023 Rationale for Evaluation and Treatment: Rehabilitation  SUBJECTIVE:   SUBJECTIVE STATEMENT: The patient is known to our clinic from prior treatment for vertigo. She notes noticing spinning sensation when looking up at light fixtures in [place] a couple of weeks ago, then felt worse when getting a bone density  scan. She noticed nausea and dizziness on Saturday when cleaning her home. She attempted to go back to prior exercises and notes the link had closed out. The patient has had h/o intermittent vertigo. Her initial episode sounds like constant vertigo years ago and may be more consistent with a vestibular neuritis.  Pt accompanied by: self  PERTINENT HISTORY: Cateracts surgery (November, January), Stygmatism R eye,   PAIN:  Are you having pain? No  PRECAUTIONS: None  WEIGHT BEARING RESTRICTIONS: No  FALLS: Has patient fallen in last 6 months? No  LIVING ENVIRONMENT: Lives with: lives alone Lives in: House/apartment  PLOF: Independent  PATIENT GOALS: Reduce dizziness  OBJECTIVE:   POSTURE:  rounded shoulders  Cervical ROM:  WFLs for all movements  STRENGTH: not assessed  PATIENT SURVEYS:  FOTO n/a due to vestibular  VESTIBULAR ASSESSMENT:  GENERAL OBSERVATION: Moves independently without a device   SYMPTOM BEHAVIOR:  Subjective history: Sudden onset of dizziness last week, h/o intermittent vertigo  Non-Vestibular symptoms:  n/a  Type of dizziness: Spinning/Vertigo   Frequency: daily  Duration: seconds to minutes  Aggravating factors: Induced by position change and looking up  Relieving factors: lying supine  Progression of symptoms: worse  OCULOMOTOR EXAM:  Ocular Alignment: normal  Ocular ROM: No Limitations  Spontaneous Nystagmus: absent  Gaze-Induced Nystagmus: absent  Smooth Pursuits: intact, feels a trace of nausea  Saccades: intact, provokes a sensation of nausea  VESTIBULAR - OCULAR REFLEX:   Slow VOR: Normal at slow pace provoking nausea  VOR Cancellation: Normal  Head-Impulse Test: HIT Right: Positive for refixation saccade. 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 Right Sidelying: latency with trace of spinning that resolved within 1-2 seconds so direction not viewed in room light, no nausea Left Sidelying: 4/10 nausea, no sensation of spinning. Gets spinning sensation that lasts for seconds with nausea when returning to sitting.  [facility] Adult PT Treatment:                                                DATE: 05/11/23  Neuromuscular re-ed: Habituation Supine rolling R<>L x 3 reps Moving to the right side provokes more apprehension and sensation it could come on, patient also gets some nausea rated 6/10 Canolith repositioning Epley's maneuver Right side based on symptoms today   PATIENT EDUCATION: Education details: HEP, nature of BPPV Person educated: Patient Education method: Explanation and Handouts Education comprehension: verbalized understanding  HOME EXERCISE PROGRAM: Access Code: [email or web address] URL: [email or web address] Date: 05/11/2023 Prepared by: Margretta Ditty  Exercises - Rolling rightleft sides for vestibular habituation  - 2 x daily - 7 x weekly - 1 sets - 5 reps - Seated Gaze Stabilization with Head Rotation  - 1 x daily - 7 x weekly - 1 sets - 10 reps  GOALS: Goals reviewed with patient? Yes  LONG TERM GOALS: Target date:06/10/23  Patient will be independent with progressed HEP to improve outcomes and carryover.  Baseline:  none established at eval Goal status: INITIAL  2.  Patient will report 80% improvement in dizziness. Baseline: dizziness limits daily activities Goal status: INITIAL  3.  The patient will have negative positional testing indicating resolution of BPPV. Baseline:  + bilateral dix hallpike-- could not test horizontal canal at eval due to severity of sxs. Goal status: INITIAL  4.  Patient will report  resolution of dizziness with looking up.  Baseline:  Notes dizziness with head motion. Goal status: INITIAL  ASSESSMENT:  CLINICAL IMPRESSION: Patient is a 67 y.o. female who was seen today for physical therapy evaluation and treatment of vertigo. In addition, the patient was + for head impulse testing indicating dec'd use of VOR. The patient has nausea with movement today and she rests frequently t/o the evaluation in order to allow nausea to settle. PT initiated treatment for bppv based on symptoms to the right and habituation. We also reviewed prior gaze adaptation exercise. PT to address positional symptoms and then progress to gaze adaptation and balance activities as needed.    OBJECTIVE IMPAIRMENTS: decreased activity tolerance, decreased balance, dizziness, and impaired vision/preception.   ACTIVITY LIMITATIONS: bending and bed mobility  PARTICIPATION LIMITATIONS: driving and community activity  PERSONAL FACTORS: 1 comorbidity: prior h/o vertigo  are also affecting patient's functional outcome.   REHAB POTENTIAL: Good  CLINICAL DECISION MAKING: Stable/uncomplicated  EVALUATION COMPLEXITY: Low  PLAN:  PT FREQUENCY: 2x/week  PT DURATION: 4 weeks  PLANNED INTERVENTIONS: Therapeutic exercises, Therapeutic activity, Neuromuscular re-education, Balance training, Gait training,  Patient/Family education, Self Care, Vestibular training, Canalith repositioning, Visual/preceptual remediation/compensation, and Manual therapy  PLAN FOR NEXT SESSION: reassess canals and treat BPPV, progress gaze adaptation and habituation as indicated   Bethanie Bloxom, PT 05/11/2023, 10:21 AM

## 2023-05-15 ENCOUNTER — Encounter: Payer: Self-pay | Admitting: Family Medicine

## 2023-05-15 MED ORDER — DESVENLAFAXINE SUCCINATE ER 100 MG PO TB24: 100.0000 mg | ORAL_TABLET | Freq: Every day | ORAL | 0 refills | Status: DC

## 2023-05-20 ENCOUNTER — Ambulatory Visit: Payer: Medicare Other

## 2023-05-20 DIAGNOSIS — Z1231 Encounter for screening mammogram for malignant neoplasm of breast: Secondary | ICD-10-CM | POA: Diagnosis not present

## 2023-05-22 ENCOUNTER — Ambulatory Visit (AMBULATORY_SURGERY_CENTER): Payer: Medicare Other | Admitting: Gastroenterology

## 2023-05-22 ENCOUNTER — Encounter: Payer: Self-pay | Admitting: Gastroenterology

## 2023-05-22 VITALS — BP 115/64 | HR 68 | Temp 97.8°F | Resp 17 | Ht 66.0 in | Wt 207.0 lb

## 2023-05-22 DIAGNOSIS — Z86010 Personal history of colonic polyps: Secondary | ICD-10-CM

## 2023-05-22 DIAGNOSIS — R110 Nausea: Secondary | ICD-10-CM | POA: Diagnosis not present

## 2023-05-22 DIAGNOSIS — K5730 Diverticulosis of large intestine without perforation or abscess without bleeding: Secondary | ICD-10-CM

## 2023-05-22 DIAGNOSIS — Z09 Encounter for follow-up examination after completed treatment for conditions other than malignant neoplasm: Secondary | ICD-10-CM

## 2023-05-22 DIAGNOSIS — Z800 Family history of malignant neoplasm of digestive organs: Secondary | ICD-10-CM

## 2023-05-22 MED ORDER — SODIUM CHLORIDE 0.9 % IV SOLN: 500.0000 mL | Freq: Once | INTRAVENOUS | Status: DC

## 2023-05-22 MED ORDER — SODIUM CHLORIDE 0.9 % IV SOLN
4.0000 mg | Freq: Once | INTRAVENOUS | Status: AC
  Administered 2023-05-22: 4 mg via INTRAVENOUS

## 2023-05-22 NOTE — Op Note (Signed)
[facility] Patient Name: Rhonda Munoz Procedure Date: 05/22/2023 4:03 PM MRN: [id number] Endoscopist: Doristine Locks , MD, [id number] Age: 67 Referring MD:  Date of Birth: 1955/12/03 Gender: Female Account #: [id number] Procedure:                Colonoscopy Indications:              Screening in patient at increased risk: Colorectal                            cancer in sister before age 37                           Last colonoscopy was 2014 in [place] and notable                            for rectal adenoma. No active GI symptoms. Family                            history notable for sister diagnosed with colon                            cancer ealier this year (age <60). Medicines:                Monitored Anesthesia Care Procedure:                Pre-Anesthesia Assessment:                           - Prior to the procedure, a History and Physical                            was performed, and patient medications and                            allergies were reviewed. The patient's tolerance of                            previous anesthesia was also reviewed. The risks                            and benefits of the procedure and the sedation                            options and risks were discussed with the patient.                            All questions were answered, and informed consent                            was obtained. Prior Anticoagulants: The patient has                            taken no anticoagulant or antiplatelet agents. ASA  Grade Assessment: II - A patient with mild systemic                            disease. After reviewing the risks and benefits,                            the patient was deemed in satisfactory condition to                            undergo the procedure.                           After obtaining informed consent, the colonoscope                            was passed under direct vision.  Throughout the                            procedure, the patient's blood pressure, pulse, and                            oxygen saturations were monitored continuously. The                            Olympus CF-HQ190L ([id number]) Colonoscope was                            introduced through the anus and advanced to the the                            cecum, identified by appendiceal orifice and                            ileocecal valve. The colonoscopy was technically                            difficult and complex due to restricted mobility of                            the colon and a tortuous colon. Successful                            completion of the procedure was aided by                            withdrawing the scope and replacing with the                            UltraSlim scope. The patient tolerated the                            procedure well. The quality of the bowel  preparation was good. The ileocecal valve,                            appendiceal orifice, and rectum were photographed. Scope In: 4:15:43 PM Scope Out: 4:42:50 PM Scope Withdrawal Time: 0 hours 8 minutes 5 seconds  Total Procedure Duration: 0 hours 27 minutes 7 seconds  Findings:                 The perianal and digital rectal examinations were                            normal.                           The sigmoid colon was significantly tortuous.                            Advancing the scope required withdrawing the scope                            and replacing with the ultraslim scope. After                            placement of the ultraslim scope, advancing the                            scope required water lavage, manual pressure, and a                            series of straightening and shortening the scope to                            obtain bowel loop reduction.                           Multiple medium-mouthed and small-mouthed                             diverticula were found in the sigmoid colon and                            descending colon.                           The exam was otherwise normal throughout the                            remainder of the colon.                           The retroflexed view of the distal rectum and anal                            verge was normal and showed no anal or rectal  abnormalities. Complications:            No immediate complications. Estimated Blood Loss:     Estimated blood loss: none. Impression:               - Tortuous colon.                           - Diverticulosis in the sigmoid colon and in the                            descending colon.                           - The distal rectum and anal verge are normal on                            retroflexion view.                           - No specimens collected. Recommendation:           - Patient has a contact number available for                            emergencies. The signs and symptoms of potential                            delayed complications were discussed with the                            patient. Return to normal activities tomorrow.                            Written discharge instructions were provided to the                            patient.                           - Resume previous diet.                           - Continue present medications.                           - Due to family history, repeat colonoscopy in 5                            years for screening purposes.                           - Return to GI office PRN. Doristine Locks, MD 05/22/2023 4:49:53 PM

## 2023-05-22 NOTE — Progress Notes (Unsigned)
Sedate, gd SR, tolerated procedure well, VSS, report to RN 

## 2023-05-22 NOTE — Progress Notes (Unsigned)
GASTROENTEROLOGY PROCEDURE H&P NOTE   Primary Care Physician: Everrett Coombe, DO    Reason for Procedure:  Colon polyp surveillance  Plan:    Colonoscopy  Patient is appropriate for endoscopic procedure(s) in the ambulatory ([facility]) setting.  The nature of the procedure, as well as the risks, benefits, and alternatives were carefully and thoroughly reviewed with the patient. Ample time for discussion and questions allowed. The patient understood, was satisfied, and agreed to proceed.     HPI: Rhonda Munoz is a 67 y.o. female who presents for colonoscopy for ongoing polyp surveillance.    Last colonoscopy was 2014 in [place] and notable for rectal adenoma. No active GI symptoms.  Family history notable for sister with colon cancer, age <60.    Past Medical History:  Diagnosis Date   Cataract    Depression    GERD (gastroesophageal reflux disease)    Hemorrhoids    Hyperlipidemia     Past Surgical History:  Procedure Laterality Date   ABDOMINAL HYSTERECTOMY     CATARACT EXTRACTION, BILATERAL Bilateral 08/2022   second one 09/2022   FINGER SURGERY Right    index finger x 1, middle finger x 2   GALLBLADDER SURGERY     TONSILLECTOMY     TUBAL LIGATION      Prior to Admission medications   Medication Sig Start Date End Date Taking? Authorizing Provider  aspirin 81 MG EC tablet Take 81 mg by mouth daily.   Yes [provider]  Calcium Carbonate-Vitamin D (CALCIUM 600 +D HIGH POTENCY PO) Take 600 mg by mouth daily.   Yes [provider]  desvenlafaxine (PRISTIQ) 100 MG 24 hr tablet Take 1 tablet (100 mg total) by mouth daily. 05/15/23  Yes Everrett Coombe, DO  Magnesium Glycinate 100 MG CAPS Take 100 mg by mouth daily at 6 (six) AM.   Yes [provider]  Multiple Vitamins-Minerals (DAILY MULTIVITAMIN PO) Take by mouth.   Yes [provider]  pantoprazole (PROTONIX) 40 MG tablet TAKE 1 TABLET DAILY 12/31/22  Yes Everrett Coombe, DO   pravastatin (PRAVACHOL) 20 MG tablet Take 1 tablet (20 mg total) by mouth every evening. 02/26/22  Yes Everrett Coombe, DO  Ascorbic Acid (VITAMIN C) 1000 MG tablet  05/25/22   [provider]  cetirizine (ZYRTEC ALLERGY) 10 MG tablet Take 1 tablet (10 mg total) by mouth daily. 02/02/23   Everrett Coombe, DO    Current Outpatient Medications  Medication Sig Dispense Refill   aspirin 81 MG EC tablet Take 81 mg by mouth daily.     Calcium Carbonate-Vitamin D (CALCIUM 600 +D HIGH POTENCY PO) Take 600 mg by mouth daily.     desvenlafaxine (PRISTIQ) 100 MG 24 hr tablet Take 1 tablet (100 mg total) by mouth daily. 90 tablet 0   Magnesium Glycinate 100 MG CAPS Take 100 mg by mouth daily at 6 (six) AM.     Multiple Vitamins-Minerals (DAILY MULTIVITAMIN PO) Take by mouth.     pantoprazole (PROTONIX) 40 MG tablet TAKE 1 TABLET DAILY 90 tablet 3   pravastatin (PRAVACHOL) 20 MG tablet Take 1 tablet (20 mg total) by mouth every evening. 90 tablet 3   Ascorbic Acid (VITAMIN C) 1000 MG tablet  (Patient not taking: Reported on 02/17/2023)     cetirizine (ZYRTEC ALLERGY) 10 MG tablet Take 1 tablet (10 mg total) by mouth daily. 90 tablet 3   Current Facility-Administered Medications  Medication Dose Route Frequency Provider Last Rate Last Admin  0.9 %  sodium chloride infusion  500 mL Intravenous Once Zayn Selley V, DO        Allergies as of 05/22/2023 - Review Complete 05/22/2023  Allergen Reaction Noted   Codeine  05/18/2017   Effexor [venlafaxine]  07/15/2017   Hydromorphone  05/18/2017   Procaine  05/18/2017    Family History  Problem Relation Age of Onset   Breast cancer Mother    Hypertension Mother    Stroke Mother    Colon polyps Mother    Diabetes Father    Heart attack Father    Heart disease Father    Hypertension Father    Hyperlipidemia Father    Colon cancer Sister    Heart failure Maternal Grandmother    Colon cancer Maternal Grandfather    Heart failure Paternal  Grandmother    Esophageal cancer Neg Hx    Rectal cancer Neg Hx    Stomach cancer Neg Hx     Social History   Socioeconomic History   Marital status: Widowed    Spouse name: Not on file   Number of children: Not on file   Years of education: Not on file   Highest education level: Associate degree: academic program  Occupational History   Occupation: Retired  Tobacco Use   Smoking status: Never   Smokeless tobacco: Never  Vaping Use   Vaping status: Never Used  Substance and Sexual Activity   Alcohol use: Never   Drug use: Never   Sexual activity: Not on file  Other Topics Concern   Not on file  Social History Narrative   Not on file   Social Determinants of Health   Financial Resource Strain: Low Risk  (12/25/2022)   Overall Financial Resource Strain (CARDIA)    Difficulty of Paying Living Expenses: Not hard at all  Food Insecurity: No Food Insecurity (12/25/2022)   Hunger Vital Sign    Worried About Running Out of Food in the Last Year: Never true    Ran Out of Food in the Last Year: Never true  Transportation Needs: No Transportation Needs (12/25/2022)   PRAPARE - Administrator, Civil Service (Medical): No    Lack of Transportation (Non-Medical): No  Physical Activity: Sufficiently Active (12/25/2022)   Exercise Vital Sign    Days of Exercise per Week: 5 days    Minutes of Exercise per Session: 30 min  Stress: No Stress Concern Present (12/25/2022)   [employer] of Occupational Health - Occupational Stress Questionnaire    Feeling of Stress : Not at all  Social Connections: Moderately Integrated (12/25/2022)   Social Connection and Isolation Panel [NHANES]    Frequency of Communication with Friends and Family: More than three times a week    Frequency of Social Gatherings with Friends and Family: More than three times a week    Attends Religious Services: More than 4 times per year    Active Member of [employer] or Organizations: Yes    Attends Tax inspector Meetings: More than 4 times per year    Marital Status: Widowed  Intimate Partner Violence: Not on file    Physical Exam: Vital signs in last 24 hours: @BP  127/69   Pulse 87   Temp 97.8 F (36.6 C)   Ht 5\' 6"  (1.676 m)   Wt 207 lb (93.9 kg)   SpO2 98%   BMI 33.41 kg/m  GEN: NAD EYE: Sclerae anicteric ENT: MMM CV: Non-tachycardic Pulm: CTA b/l GI: Soft,  NT/ND NEURO:  Alert & Oriented x 3   Doristine Locks, DO [facility]   05/22/2023 4:03 PM

## 2023-05-22 NOTE — Progress Notes (Signed)
PT with nausea and vomiting after taking second half of the prep. She reports that she is clear to yellow liquid with prep results. Notified Dr. Barron Alvine and Zofran 4mg  IV ordered and given.

## 2023-05-22 NOTE — Patient Instructions (Addendum)
Recommendation: - Patient has a contact number available for                            emergencies. The signs and symptoms of potential                            delayed complications were discussed with the                            patient. Return to normal activities tomorrow.                            Written discharge instructions were provided to the                            patient.                           - Resume previous diet.                           - Continue present medications.                           - Due to family history, repeat colonoscopy in 5                            years for screening purposes.                           - Return to GI office PRN.  Handout on Diverticulosis given.  YOU HAD AN ENDOSCOPIC PROCEDURE TODAY AT THE [facility]:   Refer to the procedure report that was given to you for any specific questions about what was found during the examination.  If the procedure report does not answer your questions, please call your gastroenterologist to clarify.  If you requested that your care partner not be given the details of your procedure findings, then the procedure report has been included in a sealed envelope for you to review at your convenience later.  YOU SHOULD EXPECT: Some feelings of bloating in the abdomen. Passage of more gas than usual.  Walking can help get rid of the air that was put into your GI tract during the procedure and reduce the bloating. If you had a lower endoscopy (such as a colonoscopy or flexible sigmoidoscopy) you may notice spotting of blood in your stool or on the toilet paper. If you underwent a bowel prep for your procedure, you may not have a normal bowel movement for a few days.  Please Note:  You might notice some irritation and congestion in your nose or some drainage.  This is from the oxygen used during your procedure.  There is no need for concern and it should clear up in a day or so.  SYMPTOMS TO  REPORT IMMEDIATELY:  Following lower endoscopy (colonoscopy or flexible sigmoidoscopy):  Excessive amounts of blood in the stool  Significant tenderness or worsening of abdominal pains  Swelling of the abdomen that is new, acute  Fever of 100F or higher  For  urgent or emergent issues, a gastroenterologist can be reached at any hour by calling [phone]. Do not use MyChart messaging for urgent concerns.    DIET:  We do recommend a small meal at first, but then you may proceed to your regular diet.  Drink plenty of fluids but you should avoid alcoholic beverages for 24 hours.  ACTIVITY:  You should plan to take it easy for the rest of today and you should NOT DRIVE or use heavy machinery until tomorrow (because of the sedation medicines used during the test).    FOLLOW UP: Our staff will call the number listed on your records the next business day following your procedure.  We will call around 7:15- 8:00 am to check on you and address any questions or concerns that you may have regarding the information given to you following your procedure. If we do not reach you, we will leave a message.     If any biopsies were taken you will be contacted by phone or by letter within the next 1-3 weeks.  Please call us at [phone] if you have not heard about the biopsies in 3 weeks.    SIGNATURES/CONFIDENTIALITY: You and/or your care partner have signed paperwork which will be entered into your electronic medical record.  These signatures attest to the fact that that the information above on your After Visit Summary has been reviewed and is understood.  Full responsibility of the confidentiality of this discharge information lies with you and/or your care-partner.

## 2023-05-25 ENCOUNTER — Telehealth: Payer: Self-pay | Admitting: *Deleted

## 2023-05-25 ENCOUNTER — Ambulatory Visit: Payer: Medicare Other | Admitting: Rehabilitative and Restorative Service Providers"

## 2023-05-25 ENCOUNTER — Encounter: Payer: Self-pay | Admitting: Rehabilitative and Restorative Service Providers"

## 2023-05-25 DIAGNOSIS — H8112 Benign paroxysmal vertigo, left ear: Secondary | ICD-10-CM

## 2023-05-25 DIAGNOSIS — H8111 Benign paroxysmal vertigo, right ear: Secondary | ICD-10-CM

## 2023-05-25 DIAGNOSIS — R42 Dizziness and giddiness: Secondary | ICD-10-CM

## 2023-05-25 NOTE — Telephone Encounter (Signed)
  Follow up Call-     05/22/2023    3:14 PM  Call back number  Post procedure Call Back phone  # [phone]  Permission to leave phone message Yes     Patient questions:  Do you have a fever, pain , or abdominal swelling? No. Pain Score  0 *  Have you tolerated food without any problems? Yes.    Have you been able to return to your normal activities? Yes.    Do you have any questions about your discharge instructions: Diet   No. Medications  No. Follow up visit  No.  Do you have questions or concerns about your Care? No.  Actions: * If pain score is 4 or above: No action needed, pain <4.

## 2023-05-25 NOTE — Therapy (Signed)
OUTPATIENT PHYSICAL THERAPY VESTIBULAR TREATMENT   Patient Name: Rhonda Munoz MRN: [id number] DOB:Jan 20, 1956, 67 y.o., female Today's Date: 05/25/2023  END OF SESSION:  PT End of Session - 05/25/23 1149     Visit Number 2    Number of Visits 8    Date for PT Re-Evaluation 07/10/23    Authorization Type medicare and tricare    Progress Note Due on Visit 10    PT Start Time 1149    PT Stop Time 1230    PT Time Calculation (min) 41 min    Activity Tolerance Patient tolerated treatment well    Behavior During Therapy WFL for tasks assessed/performed              Past Medical History:  Diagnosis Date   Cataract    Depression    GERD (gastroesophageal reflux disease)    Hemorrhoids    Hyperlipidemia    Past Surgical History:  Procedure Laterality Date   ABDOMINAL HYSTERECTOMY     CATARACT EXTRACTION, BILATERAL Bilateral 08/2022   second one 09/2022   FINGER SURGERY Right    index finger x 1, middle finger x 2   GALLBLADDER SURGERY     TONSILLECTOMY     TUBAL LIGATION     Patient Active Problem List   Diagnosis Date Noted   Benign paroxysmal positional vertigo 01/04/2023   Yeast dermatitis 07/02/2022   Depression 06/05/2021   Oral candidiasis 09/06/2020   Osteopenia 04/26/2020   Hypercalcemia 01/02/2020   Hyperlipemia 01/02/2020   Prediabetes 01/02/2020   Postmenopausal 12/13/2019   Knee pain, left 09/28/2017   Gastroesophageal reflux disease with esophagitis 05/18/2017   History of colon polyps 05/18/2017   PCP: Everrett Coombe, DO REFERRING PROVIDER: Everrett Coombe, DO REFERRING DIAG: H81.10 (ICD-10-CM) - Benign paroxysmal positional vertigo, unspecified laterality  THERAPY DIAG:  Dizziness and giddiness  BPPV (benign paroxysmal positional vertigo), left  BPPV (benign paroxysmal positional vertigo), right  ONSET DATE: 04/30/2023 Rationale for Evaluation and Treatment: Rehabilitation  SUBJECTIVE:   SUBJECTIVE STATEMENT: The patient reports she  thinks the vertigo is gone. She has had a time or 2 with rolling in bed that she feels vertigo. Overall, it is very mild.   EVAL:The patient is known to our clinic from prior treatment for vertigo. She notes noticing spinning sensation when looking up at light fixtures in [place] a couple of weeks ago, then felt worse when getting a bone density scan. She noticed nausea and dizziness on Saturday when cleaning her home. She attempted to go back to prior exercises and notes the link had closed out. The patient has had h/o intermittent vertigo. Her initial episode sounds like constant vertigo years ago and may be more consistent with a vestibular neuritis.  Pt accompanied by: self  PERTINENT HISTORY: Cateracts surgery (November, January), Stygmatism R eye,   PAIN:  Are you having pain? No  PRECAUTIONS: None  WEIGHT BEARING RESTRICTIONS: No  FALLS: Has patient fallen in last 6 months? No  PATIENT GOALS: Reduce dizziness  OBJECTIVE:  (Measures in this section from initial evaluation unless otherwise noted)  VESTIBULAR ASSESSMENT:  GENERAL OBSERVATION: Moves independently without a device   SYMPTOM BEHAVIOR:  Subjective history: Sudden onset of dizziness last week, h/o intermittent vertigo  Non-Vestibular symptoms:  n/a  Type of dizziness: Spinning/Vertigo   Frequency: daily  Duration: seconds to minutes  Aggravating factors: Induced by position change and looking up  Relieving factors: lying supine  Progression of symptoms: worse  OCULOMOTOR EXAM:  Ocular  Alignment: normal  Ocular ROM: No Limitations  Spontaneous Nystagmus: absent  Gaze-Induced Nystagmus: absent  Smooth Pursuits: intact, feels a trace of nausea  Saccades: intact, provokes a sensation of nausea   VESTIBULAR - OCULAR REFLEX:   Slow VOR: Normal at slow pace provoking nausea  VOR Cancellation: Normal  Head-Impulse Test: HIT Right: Positive for refixation saccade. HIT Left: able to maintain fixation  Dynamic  Visual Acuity:  not assessed today   POSITIONAL TESTING: Right Dix-Hallpike: upbeating, right nystagmus Left Dix-Hallpike: upbeating, left nystagmus Right Roll Test: not tested today-- severe symptoms with bilat dix hallpike and know there is multi-canal bppv-- will treat posterior canals first and then horizontal canals Left Roll Test: to be further assessed. Right Sidelying: latency with trace of spinning that resolved within 1-2 seconds so direction not viewed in room light, no nausea Left Sidelying: 4/10 nausea, no sensation of spinning. Gets spinning sensation that lasts for seconds with nausea when returning to sitting.   [facility] Adult PT Treatment:                                                DATE: 05/25/23 Neuromuscular re-ed: Habituation Austin Miles -- patient gets mild symptoms to the right side and when returning to upright; negative to the left Positional testing: R dix hallpike: trace  L dix hallpike negative Horizontal roll R negative Horizontal roll L negative  CANOLITH REPOSITIONING MANEUVER: R epley's maneuver Self Care: Discussed doing prior HEP of corner standing eyes closed Discussed habituation and doing until 2 days symptom free D/c gaze due to patient without difficulty   [facility] Adult PT Treatment:                                                DATE: 05/11/23  Neuromuscular re-ed: Habituation Supine rolling R<>L x 3 reps Moving to the right side provokes more apprehension and sensation it could come on, patient also gets some nausea rated 6/10 Canolith repositioning Epley's maneuver Right side based on symptoms today   PATIENT EDUCATION: Education details: HEP, nature of BPPV Person educated: Patient Education method: Explanation and Handouts Education comprehension: verbalized understanding  HOME EXERCISE PROGRAM: Access Code: [email or web address] URL: [email or web address] Date: 05/11/2023 Prepared by: Margretta Ditty  Exercises - Rolling  rightleft sides for vestibular habituation  - 2 x daily - 7 x weekly - 1 sets - 5 reps - Seated Gaze Stabilization with Head Rotation  - 1 x daily - 7 x weekly - 1 sets - 10 reps  GOALS: Goals reviewed with patient? Yes  LONG TERM GOALS: Target date:06/10/23  Patient will be independent with progressed HEP to improve outcomes and carryover.  Baseline:  none established at eval Goal status: INITIAL  2.  Patient will report 80% improvement in dizziness. Baseline: dizziness limits daily activities Goal status: INITIAL  3.  The patient will have negative positional testing indicating resolution of BPPV. Baseline:  + bilateral dix hallpike-- could not test horizontal canal at eval due to severity of sxs. Goal status: INITIAL  4.  Patient will report resolution of dizziness with looking up.  Baseline:  Notes dizziness with head motion. Goal status: INITIAL  ASSESSMENT:  CLINICAL IMPRESSION: The patient continues with trace BPPV  on the right side. She continues with mild symptoms with rolling to the right. PT to reassess next visit and determine d/c versus renewal.   EVAL: Patient is a 67 y.o. female who was seen today for physical therapy evaluation and treatment of vertigo. In addition, the patient was + for head impulse testing indicating dec'd use of VOR. The patient has nausea with movement today and she rests frequently t/o the evaluation in order to allow nausea to settle. PT initiated treatment for bppv based on symptoms to the right and habituation. We also reviewed prior gaze adaptation exercise. PT to address positional symptoms and then progress to gaze adaptation and balance activities as needed.    OBJECTIVE IMPAIRMENTS: decreased activity tolerance, decreased balance, dizziness, and impaired vision/preception.   PLAN:  PT FREQUENCY: 2x/week  PT DURATION: 4 weeks  PLANNED INTERVENTIONS: Therapeutic exercises, Therapeutic activity, Neuromuscular re-education, Balance  training, Gait training, Patient/Family education, Self Care, Vestibular training, Canalith repositioning, Visual/preceptual remediation/compensation, and Manual therapy  PLAN FOR NEXT SESSION: reassess canals and treat BPPV, progress gaze adaptation and habituation as indicated   Leray Garverick, PT 05/25/2023, 12:47 PM

## 2023-06-01 ENCOUNTER — Ambulatory Visit (INDEPENDENT_AMBULATORY_CARE_PROVIDER_SITE_OTHER): Payer: Medicare Other | Admitting: Family Medicine

## 2023-06-01 VITALS — BP 131/58 | HR 92 | Ht 67.0 in | Wt 210.0 lb

## 2023-06-01 DIAGNOSIS — Z0000 Encounter for general adult medical examination without abnormal findings: Secondary | ICD-10-CM | POA: Diagnosis not present

## 2023-06-01 NOTE — Progress Notes (Signed)
MEDICARE ANNUAL WELLNESS VISIT  06/01/2023  Subjective:  Rhonda Munoz is a 67 y.o. female patient of Everrett Coombe, DO who had a Medicare Annual Wellness Visit today. Maurisha is Retired and lives alone. she has 2 children. she reports that she is socially active and does interact with friends/family regularly. she is moderately physically active and enjoys reading, social gatherings at church and spending time with her grandchildren.   Patient Care Team: Everrett Coombe, DO as PCP - General (Family Medicine)     06/01/2023    2:57 PM 05/11/2023   10:20 AM 01/05/2023    9:48 AM 10/23/2021   11:21 AM 10/18/2021    1:15 PM  Advanced Directives  Does Patient Have a Medical Advance Directive? Yes Yes Yes Yes Yes  Type of Advance Directive Living will;Healthcare Power of [employer] Power of [place];Living will Healthcare Power of [place];Living will  Does patient want to make changes to medical advance directive? No - Patient declined      Copy of Healthcare Power of Attorney in Chart? No - copy requested        Hospital Utilization Over the Past 12 Months: # of hospitalizations or ER visits: 0 # of surgeries: 1  Review of Systems    Patient reports that her overall health is unchanged when compared to last year.  Review of Systems: History obtained from chart review and the patient  All other systems negative.  Pain Assessment Pain : No/denies pain     Current Medications & Allergies (verified) Allergies as of 06/01/2023       Reactions   Clenpiq [sod Picosulfate-mag Ox-cit Acd]    Sweating, vomiting and bad headache   Codeine    Nausea, Rash   Effexor [venlafaxine]    VISUAL HALLUCINATIONS   Hydromorphone    Tachycardia, rash   Procaine    Nausea, Rash        Medication List        Accurate as of June 01, 2023  3:49 PM. If you have any questions, ask your nurse or doctor.          STOP taking these medications    cetirizine 10 MG  tablet Commonly known as: ZyrTEC Allergy   vitamin C 1000 MG tablet       TAKE these medications    aspirin EC 81 MG tablet Take 81 mg by mouth daily.   CALCIUM 600 +D HIGH POTENCY PO Take 600 mg by mouth daily.   DAILY MULTIVITAMIN PO Take by mouth.   desvenlafaxine 100 MG 24 hr tablet Commonly known as: PRISTIQ Take 1 tablet (100 mg total) by mouth daily.   Magnesium Glycinate 100 MG Caps Take 100 mg by mouth daily at 6 (six) AM.   pantoprazole 40 MG tablet Commonly known as: PROTONIX TAKE 1 TABLET DAILY   pravastatin 20 MG tablet Commonly known as: PRAVACHOL Take 1 tablet (20 mg total) by mouth every evening.        History (reviewed): Past Medical History:  Diagnosis Date   Allergy    Cataract    Depression    GERD (gastroesophageal reflux disease)    Hemorrhoids    Hyperlipidemia    Past Surgical History:  Procedure Laterality Date   ABDOMINAL HYSTERECTOMY     CATARACT EXTRACTION, BILATERAL Bilateral 08/2022   second one 09/2022   CHOLECYSTECTOMY     EYE SURGERY  2024   Cataract   FINGER SURGERY Right    index  finger x 1, middle finger x 2   GALLBLADDER SURGERY     TONSILLECTOMY     TUBAL LIGATION     Family History  Problem Relation Age of Onset   Breast cancer Mother    Hypertension Mother    Stroke Mother    Colon polyps Mother    Arthritis Mother    Asthma Mother    Cancer Mother    Hearing loss Mother    Miscarriages / [place] Mother    Obesity Mother    Diabetes Father    Heart attack Father    Heart disease Father    Hypertension Father    Hyperlipidemia Father    Arthritis Father    Obesity Father    Varicose Veins Father    Cancer Sister    Colon cancer Sister    Heart failure Maternal Grandmother    Colon cancer Maternal Grandfather    Heart failure Paternal Grandmother    Cancer Sister    Esophageal cancer Neg Hx    Rectal cancer Neg Hx    Stomach cancer Neg Hx    Social History   Socioeconomic History    Marital status: Widowed    Spouse name: Not on file   Number of children: 2   Years of education: 14   Highest education level: Associate degree: academic program  Occupational History   Occupation: Retired  Tobacco Use   Smoking status: Never   Smokeless tobacco: Never  Vaping Use   Vaping status: Never Used  Substance and Sexual Activity   Alcohol use: Never   Drug use: Never   Sexual activity: Not Currently    Partners: Male    Birth control/protection: Post-menopausal, Surgical  Other Topics Concern   Not on file  Social History Narrative   Lives with her dog. She has two sons. She enjoys reading, social gatherings at church and spending time with her grandchildren. Her sons live in [place].   Social Determinants of Health   Financial Resource Strain: Low Risk  (05/28/2023)   Overall Financial Resource Strain (CARDIA)    Difficulty of Paying Living Expenses: Not hard at all  Food Insecurity: No Food Insecurity (05/28/2023)   Hunger Vital Sign    Worried About Running Out of Food in the Last Year: Never true    Ran Out of Food in the Last Year: Never true  Transportation Needs: No Transportation Needs (05/28/2023)   PRAPARE - Administrator, Civil Service (Medical): No    Lack of Transportation (Non-Medical): No  Physical Activity: Insufficiently Active (05/28/2023)   Exercise Vital Sign    Days of Exercise per Week: 3 days    Minutes of Exercise per Session: 20 min  Stress: No Stress Concern Present (05/28/2023)   [employer] of Occupational Health - Occupational Stress Questionnaire    Feeling of Stress : Only a little  Social Connections: Moderately Integrated (06/01/2023)   Social Connection and Isolation Panel [NHANES]    Frequency of Communication with Friends and Family: More than three times a week    Frequency of Social Gatherings with Friends and Family: Three times a week    Attends Religious Services: More than 4 times per year     Active Member of Clubs or Organizations: Yes    Attends Banker Meetings: More than 4 times per year    Marital Status: Widowed    Activities of Daily Living    05/28/2023  9:31 AM  In your present state of health, do you have any difficulty performing the following activities:  Hearing? 0  Vision? 0  Difficulty concentrating or making decisions? 0  Walking or climbing stairs? 0  Dressing or bathing? 0  Doing errands, shopping? 0  Preparing Food and eating ? N  Using the Toilet? N  In the past six months, have you accidently leaked urine? N  Do you have problems with loss of bowel control? N  Managing your Medications? N  Managing your Finances? N  Housekeeping or managing your Housekeeping? N    Patient Education/Literacy How often do you need to have someone help you when you read instructions, pamphlets, or other written materials from your doctor or pharmacy?: 1 - Never What is the last grade level you completed in school?: associates degree  Exercise    Diet Patient reports consuming 3 meals a day and 1 snack(s) a day Patient reports that her primary diet is: Regular Patient reports that she does have regular access to food.   Depression Screen    06/01/2023    3:00 PM 07/02/2022    1:56 PM 02/26/2022    4:06 PM 06/05/2021    9:30 AM  PHQ 2/9 Scores  PHQ - 2 Score 0 0 0 0  PHQ- 9 Score  1 1 2      Fall Risk    06/01/2023    2:59 PM 05/28/2023    9:31 AM 05/26/2022    8:53 PM 06/05/2021    9:30 AM  Fall Risk   Falls in the past year? 0 0 0 0  Number falls in past yr: 0  0 0  Injury with Fall? 0  0 0  Risk for fall due to : No Fall Risks  No Fall Risks No Fall Risks  Follow up Falls evaluation completed  Falls evaluation completed Falls evaluation completed     Objective:   BP (!) 131/58 (BP Location: Left Arm, Patient Position: Sitting, Cuff Size: Large)   Pulse 92   Ht 5\' 7"  (1.702 m)   Wt 210 lb 0.6 oz (95.3 kg)   SpO2 100%   BMI  32.90 kg/m   Last Weight  Most recent update: 06/01/2023  2:54 PM    Weight  95.3 kg (210 lb 0.6 oz)             Body mass index is 32.9 kg/m.  Hearing/Vision  Lygia did not have difficulty with hearing/understanding during the face-to-face interview Sura did not have difficulty with her vision during the face-to-face interview Reports that she has had a formal eye exam by an eye care professional within the past year Reports that she has not had a formal hearing evaluation within the past year  Cognitive Function:    06/01/2023    3:13 PM 05/26/2022   10:37 AM  6CIT Screen  What Year? 0 points 0 points  What month? 0 points 0 points  What time? 0 points 0 points  Count back from 20 0 points 0 points  Months in reverse 0 points 0 points  Repeat phrase 4 points 0 points  Total Score 4 points 0 points    Normal Cognitive Function Screening: Yes (Normal:0-7, Significant for Dysfunction: >8)  Immunization & Health Maintenance Record Immunization History  Administered Date(s) Administered   Fluad Quad(high Dose 65+) 07/02/2022   Influenza,inj,Quad PF,6+ Mos 06/06/2019, 07/01/2021   Influenza-Unspecified 05/10/2020   PFIZER(Purple Top)SARS-COV-2 Vaccination 11/18/2019, 12/21/2019, 10/05/2020  Tdap 11/24/2014, 01/13/2015   Zoster Recombinant(Shingrix) 02/07/2020, 05/10/2020    Health Maintenance  Topic Date Due   COVID-19 Vaccine (4 - 2023-24 season) 06/17/2023 (Originally 04/26/2023)   INFLUENZA VACCINE  11/23/2023 (Originally 03/26/2023)   Hepatitis C Screening  05/31/2024 (Originally [birth year]/1976)   HIV Screening  05/31/2024 (Originally 07/07/1973)   Medicare Annual Wellness (AWV)  05/31/2024   DTaP/Tdap/Td (3 - Td or Tdap) 01/12/2025   MAMMOGRAM  05/19/2025   DEXA SCAN  04/21/2026   Colonoscopy  05/21/2028   Zoster Vaccines- Shingrix  Completed   HPV VACCINES  Aged Out       Assessment  This is a routine wellness examination for [employer].  Health  Maintenance: Due or Overdue There are no preventive care reminders to display for this patient.   [name] does not need a referral for Community Assistance: Care Management:   no Social Work:    no Prescription Assistance:  no Nutrition/Diabetes Education:  no   Plan:  Personalized Goals  Goals Addressed               This Visit's Progress     Patient Stated (pt-stated)        Patient stated that she would like to loose weight.       Personalized Health Maintenance & Screening Recommendations  Influenza vaccine - declined  Lung Cancer Screening Recommended: no (Low Dose CT Chest recommended if Age 66-80 years, 20 pack-year currently smoking OR have quit w/in past 15 years) Hepatitis C Screening recommended: yes HIV Screening recommended: yes  Advanced Directives: Written information was not given per the patient's request.  Referrals & Orders No orders of the defined types were placed in this encounter.   Follow-up Plan Follow-up with Everrett Coombe, DO as planned Medicare wellness visit in one year.  AVS printed and given to the patient.   I have personally reviewed and noted the following in the patient's chart:   Medical and social history Use of alcohol, tobacco or illicit drugs  Current medications and supplements Functional ability and status Nutritional status Physical activity Advanced directives List of other physicians Hospitalizations, surgeries, and ER visits in previous 12 months Vitals Screenings to include cognitive, depression, and falls Referrals and appointments  In addition, I have reviewed and discussed with patient certain preventive protocols, quality metrics, and best practice recommendations. A written personalized care plan for preventive services as well as general preventive health recommendations were provided to patient.     Modesto Charon, RN BSN  06/01/2023

## 2023-06-01 NOTE — Patient Instructions (Addendum)
MEDICARE ANNUAL WELLNESS VISIT Health Maintenance Summary and Written Plan of Care  [name] ,  Thank you for allowing me to perform your Medicare Annual Wellness Visit and for your ongoing commitment to your health.   Health Maintenance & Immunization History Health Maintenance  Topic Date Due   COVID-19 Vaccine (4 - 2023-24 season) 06/17/2023 (Originally 04/26/2023)   INFLUENZA VACCINE  11/23/2023 (Originally 03/26/2023)   Hepatitis C Screening  05/31/2024 (Originally 07/07/1976)   HIV Screening  05/31/2024 (Originally 07/07/1973)   Medicare Annual Wellness (AWV)  05/31/2024   DTaP/Tdap/Td (3 - Td or Tdap) 01/12/2025   MAMMOGRAM  05/19/2025   DEXA SCAN  04/21/2026   Colonoscopy  05/21/2028   Zoster Vaccines- Shingrix  Completed   HPV VACCINES  Aged Out   Immunization History  Administered Date(s) Administered   Fluad Quad(high Dose 65+) 07/02/2022   Influenza,inj,Quad PF,6+ Mos 06/06/2019, 07/01/2021   Influenza-Unspecified 05/10/2020   PFIZER(Purple Top)SARS-COV-2 Vaccination 11/18/2019, 12/21/2019, 10/05/2020   Tdap 11/24/2014, 01/13/2015   Zoster Recombinant(Shingrix) 02/07/2020, 05/10/2020    These are the patient goals that we discussed:  Goals Addressed               This Visit's Progress     Patient Stated (pt-stated)        Patient stated that she would like to loose weight.         This is a list of Health Maintenance Items that are overdue or due now: Influenza vaccine - declined    Orders/Referrals Placed Today: No orders of the defined types were placed in this encounter.  (Contact our referral department at [phone] if you have not spoken with someone about your referral appointment within the next 5 days)    Follow-up Plan Follow-up with Everrett Coombe, DO as planned Medicare wellness visit in one year.  AVS printed and given to the patient.      Health Maintenance, Female Adopting a healthy lifestyle and getting preventive care  are important in promoting health and wellness. Ask your health care provider about: The right schedule for you to have regular tests and exams. Things you can do on your own to prevent diseases and keep yourself healthy. What should I know about diet, weight, and exercise? Eat a healthy diet  Eat a diet that includes plenty of vegetables, fruits, low-fat dairy products, and lean protein. Do not eat a lot of foods that are high in solid fats, added sugars, or sodium. Maintain a healthy weight Body mass index (BMI) is used to identify weight problems. It estimates body fat based on height and weight. Your health care provider can help determine your BMI and help you achieve or maintain a healthy weight. Get regular exercise Get regular exercise. This is one of the most important things you can do for your health. Most adults should: Exercise for at least 150 minutes each week. The exercise should increase your heart rate and make you sweat (moderate-intensity exercise). Do strengthening exercises at least twice a week. This is in addition to the moderate-intensity exercise. Spend less time sitting. Even light physical activity can be beneficial. Watch cholesterol and blood lipids Have your blood tested for lipids and cholesterol at 65 years of age, then have this test every 5 years. Have your cholesterol levels checked more often if: Your lipid or cholesterol levels are high. You are older than 65 years of age. You are at high risk for heart disease. What should I know about cancer screening?  Depending on your health history and family history, you may need to have cancer screening at various ages. This may include screening for: Breast cancer. Cervical cancer. Colorectal cancer. Skin cancer. Lung cancer. What should I know about heart disease, diabetes, and high blood pressure? Blood pressure and heart disease High blood pressure causes heart disease and increases the risk of stroke.  This is more likely to develop in people who have high blood pressure readings or are overweight. Have your blood pressure checked: Every 3-5 years if you are 68-42 years of age. Every year if you are 64 years old or older. Diabetes Have regular diabetes screenings. This checks your fasting blood sugar level. Have the screening done: Once every three years after age 11 if you are at a normal weight and have a low risk for diabetes. More often and at a younger age if you are overweight or have a high risk for diabetes. What should I know about preventing infection? Hepatitis B If you have a higher risk for hepatitis B, you should be screened for this virus. Talk with your health care provider to find out if you are at risk for hepatitis B infection. Hepatitis C Testing is recommended for: Everyone born from [birth year] through 1963. Anyone with known risk factors for hepatitis C. Sexually transmitted infections (STIs) Get screened for STIs, including gonorrhea and chlamydia, if: You are sexually active and are younger than 65 years of age. You are older than 65 years of age and your health care provider tells you that you are at risk for this type of infection. Your sexual activity has changed since you were last screened, and you are at increased risk for chlamydia or gonorrhea. Ask your health care provider if you are at risk. Ask your health care provider about whether you are at high risk for HIV. Your health care provider may recommend a prescription medicine to help prevent HIV infection. If you choose to take medicine to prevent HIV, you should first get tested for HIV. You should then be tested every 3 months for as long as you are taking the medicine. Pregnancy If you are about to stop having your period (premenopausal) and you may become pregnant, seek counseling before you get pregnant. Take 400 to 800 micrograms (mcg) of folic acid every day if you become pregnant. Ask for birth control  (contraception) if you want to prevent pregnancy. Osteoporosis and menopause Osteoporosis is a disease in which the bones lose minerals and strength with aging. This can result in bone fractures. If you are 78 years old or older, or if you are at risk for osteoporosis and fractures, ask your health care provider if you should: Be screened for bone loss. Take a calcium or vitamin D supplement to lower your risk of fractures. Be given hormone replacement therapy (HRT) to treat symptoms of menopause. Follow these instructions at home: Alcohol use Do not drink alcohol if: Your health care provider tells you not to drink. You are pregnant, may be pregnant, or are planning to become pregnant. If you drink alcohol: Limit how much you have to: 0-1 drink a day. Know how much alcohol is in your drink. In the [place] one drink equals one 12 oz bottle of beer (355 mL), one 5 oz glass of wine (148 mL), or one 1 oz glass of hard liquor (44 mL). Lifestyle Do not use any products that contain nicotine or tobacco. These products include cigarettes, chewing tobacco, and vaping devices, such as  e-cigarettes. If you need help quitting, ask your health care provider. Do not use street drugs. Do not share needles. Ask your health care provider for help if you need support or information about quitting drugs. General instructions Schedule regular health, dental, and eye exams. Stay current with your vaccines. Tell your health care provider if: You often feel depressed. You have ever been abused or do not feel safe at home. Summary Adopting a healthy lifestyle and getting preventive care are important in promoting health and wellness. Follow your health care provider's instructions about healthy diet, exercising, and getting tested or screened for diseases. Follow your health care provider's instructions on monitoring your cholesterol and blood pressure. This information is not intended to replace advice given  to you by your health care provider. Make sure you discuss any questions you have with your health care provider. Document Revised: 12/31/2020 Document Reviewed: 12/31/2020 Elsevier Patient Education  2024 [employer].

## 2023-06-08 ENCOUNTER — Encounter: Payer: Self-pay | Admitting: Rehabilitative and Restorative Service Providers"

## 2023-06-08 ENCOUNTER — Ambulatory Visit (INDEPENDENT_AMBULATORY_CARE_PROVIDER_SITE_OTHER): Payer: Medicare Other

## 2023-06-08 ENCOUNTER — Ambulatory Visit: Payer: Medicare Other | Attending: Family Medicine | Admitting: Rehabilitative and Restorative Service Providers"

## 2023-06-08 DIAGNOSIS — Z23 Encounter for immunization: Secondary | ICD-10-CM | POA: Diagnosis not present

## 2023-06-08 DIAGNOSIS — R42 Dizziness and giddiness: Secondary | ICD-10-CM | POA: Diagnosis present

## 2023-06-08 DIAGNOSIS — H8112 Benign paroxysmal vertigo, left ear: Secondary | ICD-10-CM | POA: Diagnosis present

## 2023-06-08 DIAGNOSIS — H8111 Benign paroxysmal vertigo, right ear: Secondary | ICD-10-CM | POA: Diagnosis present

## 2023-06-08 NOTE — Therapy (Addendum)
 OUTPATIENT PHYSICAL THERAPY VESTIBULAR TREATMENT AND DISCHARGE SUMMARY   Patient Name: Mazell Aylesworth MRN: [id number] DOB:[birth year], 67 y.o., female Today's Date: 06/08/2023    Patient did not return to PT.  Please refer to the below note for last known patient status. Thank you for the referral of this patient. Margretta Ditty, MPT  END OF SESSION:  PT End of Session - 06/08/23 1320     Visit Number 3    Number of Visits 8    Date for PT Re-Evaluation 07/10/23    Authorization Type medicare and tricare    Progress Note Due on Visit 10    PT Start Time 1319    PT Stop Time 1357    PT Time Calculation (min) 38 min    Activity Tolerance Patient tolerated treatment well    Behavior During Therapy WFL for tasks assessed/performed            Past Medical History:  Diagnosis Date   Allergy    Cataract    Depression    GERD (gastroesophageal reflux disease)    Hemorrhoids    Hyperlipidemia    Past Surgical History:  Procedure Laterality Date   ABDOMINAL HYSTERECTOMY     CATARACT EXTRACTION, BILATERAL Bilateral 08/2022   second one 09/2022   CHOLECYSTECTOMY     EYE SURGERY  2024   Cataract   FINGER SURGERY Right    index finger x 1, middle finger x 2   GALLBLADDER SURGERY     TONSILLECTOMY     TUBAL LIGATION     Patient Active Problem List   Diagnosis Date Noted   Benign paroxysmal positional vertigo 01/04/2023   Yeast dermatitis 07/02/2022   Depression 06/05/2021   Oral candidiasis 09/06/2020   Osteopenia 04/26/2020   Hypercalcemia 01/02/2020   Hyperlipemia 01/02/2020   Prediabetes 01/02/2020   Postmenopausal 12/13/2019   Knee pain, left 09/28/2017   Gastroesophageal reflux disease with esophagitis 05/18/2017   History of colon polyps 05/18/2017   PCP: Everrett Coombe, DO REFERRING PROVIDER: Everrett Coombe, DO REFERRING DIAG: H81.10 (ICD-10-CM) - Benign paroxysmal positional vertigo, unspecified laterality  THERAPY DIAG:  Dizziness and  giddiness  BPPV (benign paroxysmal positional vertigo), left  BPPV (benign paroxysmal positional vertigo), right  ONSET DATE: 04/30/2023 Rationale for Evaluation and Treatment: Rehabilitation  SUBJECTIVE:   SUBJECTIVE STATEMENT: The patient reports she thinks that vertigo is resolved. The exercises have been going well. Sometimes she notes imbalance.   EVAL:The patient is known to our clinic from prior treatment for vertigo. She notes noticing spinning sensation when looking up at light fixtures in [place] a couple of weeks ago, then felt worse when getting a bone density scan. She noticed nausea and dizziness on Saturday when cleaning her home. She attempted to go back to prior exercises and notes the link had closed out. The patient has had h/o intermittent vertigo. Her initial episode sounds like constant vertigo years ago and may be more consistent with a vestibular neuritis.  Pt accompanied by: self  PERTINENT HISTORY: Cateracts surgery (November, January), Stygmatism R eye,   PAIN:  Are you having pain? No  PRECAUTIONS: None  WEIGHT BEARING RESTRICTIONS: No  FALLS: Has patient fallen in last 6 months? No  PATIENT GOALS: Reduce dizziness  OBJECTIVE:  (Measures in this section from initial evaluation unless otherwise noted)  VESTIBULAR ASSESSMENT:  GENERAL OBSERVATION: Moves independently without a device   SYMPTOM BEHAVIOR:  Subjective history: Sudden onset of dizziness last week, h/o intermittent vertigo  Non-Vestibular symptoms:  n/a  Type of dizziness: Spinning/Vertigo   Frequency: daily  Duration: seconds to minutes  Aggravating factors: Induced by position change and looking up  Relieving factors: lying supine  Progression of symptoms: worse  OCULOMOTOR EXAM:  Ocular Alignment: normal  Ocular ROM: No Limitations  Spontaneous Nystagmus: absent  Gaze-Induced Nystagmus: absent  Smooth Pursuits: intact, feels a trace of nausea  Saccades: intact, provokes a  sensation of nausea   VESTIBULAR - OCULAR REFLEX:   Slow VOR: Normal at slow pace provoking nausea  VOR Cancellation: Normal  Head-Impulse Test: HIT Right: Positive for refixation saccade. HIT Left: able to maintain fixation  Dynamic Visual Acuity:  not assessed today   POSITIONAL TESTING: Right Dix-Hallpike: upbeating, right nystagmus Left Dix-Hallpike: upbeating, left nystagmus Right Roll Test: not tested today-- severe symptoms with bilat dix hallpike and know there is multi-canal bppv-- will treat posterior canals first and then horizontal canals Left Roll Test: to be further assessed. Right Sidelying: latency with trace of spinning that resolved within 1-2 seconds so direction not viewed in room light, no nausea Left Sidelying: 4/10 nausea, no sensation of spinning. Gets spinning sensation that lasts for seconds with nausea when returning to sitting.  [facility] Adult PT Treatment:                                                DATE: 06/08/23 TNeuromuscular re-ed: Positional Testing: R trace positional vertigo after 10 second latency with R dix hallpike L dix hallpike negative Habituation  Brandt daroff to the R side with trace  Rolling R<>L without sensation of vertigo Standing with feet together 5 horizontal head turns Standing with feet together 3 vertical head turns *gets nausea with standing head motions CANOLITH REPOSITIONING MANEUVER: R epley's maneuver Corner balance standing on solid ground with eyes closed x 30 seconds Single leg standing near support surface Sit<>stand with eyes closed x 3 reps near support, but not relying on external support as able   [facility] Adult PT Treatment:                                                DATE: 05/25/23 Neuromuscular re-ed: Habituation Austin Miles -- patient gets mild symptoms to the right side and when returning to upright; negative to the left Positional testing: R dix hallpike: trace  L dix hallpike negative Horizontal roll R  negative Horizontal roll L negative  CANOLITH REPOSITIONING MANEUVER: R epley's maneuver Self Care: Discussed doing prior HEP of corner standing eyes closed Discussed habituation and doing until 2 days symptom free D/c gaze due to patient without difficulty   [facility] Adult PT Treatment:                                                DATE: 05/11/23  Neuromuscular re-ed: Habituation Supine rolling R<>L x 3 reps Moving to the right side provokes more apprehension and sensation it could come on, patient also gets some nausea rated 6/10 Canolith repositioning Epley's maneuver Right side based on symptoms today   PATIENT EDUCATION: Education details: HEP, nature of BPPV Person educated:  Patient Education method: Explanation and Handouts Education comprehension: verbalized understanding  HOME EXERCISE PROGRAM: Access Code: [email or web address] URL: [email or web address] Date: 05/11/2023 Prepared by: Margretta Ditty  Exercises - Rolling rightleft sides for vestibular habituation  - 2 x daily - 7 x weekly - 1 sets - 5 reps - Seated Gaze Stabilization with Head Rotation  - 1 x daily - 7 x weekly - 1 sets - 10 reps  GOALS: Goals reviewed with patient? Yes  LONG TERM GOALS: Target date:06/10/23  Patient will be independent with progressed HEP to improve outcomes and carryover.  Baseline:  none established at eval Goal status: INITIAL  2.  Patient will report 80% improvement in dizziness. Baseline: dizziness limits daily activities Goal status: INITIAL  3.  The patient will have negative positional testing indicating resolution of BPPV. Baseline:  + bilateral dix hallpike-- could not test horizontal canal at eval due to severity of sxs. Goal status: INITIAL  4.  Patient will report resolution of dizziness with looking up.  Baseline:  Notes dizziness with head motion. Goal status: INITIAL  ASSESSMENT:  CLINICAL IMPRESSION: The patient continues with trace BPPV on the right  side. She gets nausea with head turns for habituation. PT encouraged her to perform at low frequency in order to continue to work through symptoms. PT to continue progressing multi-sensory balance challenge.   EVAL: Patient is a 67 y.o. female who was seen today for physical therapy evaluation and treatment of vertigo. In addition, the patient was + for head impulse testing indicating dec'd use of VOR. The patient has nausea with movement today and she rests frequently t/o the evaluation in order to allow nausea to settle. PT initiated treatment for bppv based on symptoms to the right and habituation. We also reviewed prior gaze adaptation exercise. PT to address positional symptoms and then progress to gaze adaptation and balance activities as needed.    OBJECTIVE IMPAIRMENTS: decreased activity tolerance, decreased balance, dizziness, and impaired vision/preception.   PLAN:  PT FREQUENCY: 2x/week  PT DURATION: 4 weeks  PLANNED INTERVENTIONS: Therapeutic exercises, Therapeutic activity, Neuromuscular re-education, Balance training, Gait training, Patient/Family education, Self Care, Vestibular training, Canalith repositioning, Visual/preceptual remediation/compensation, and Manual therapy  PLAN FOR NEXT SESSION: CHECK LONG TERM GOALS AND RENEW. Multi-sensory balance activities, reassess canals and treat BPPV, progress gaze adaptation and habituation as indicated   Isabelly Kobler, PT 06/08/2023, 1:20 PM

## 2023-06-17 ENCOUNTER — Encounter: Payer: Medicare Other | Admitting: Rehabilitative and Restorative Service Providers"

## 2023-06-17 NOTE — Therapy (Deleted)
OUTPATIENT PHYSICAL THERAPY VESTIBULAR TREATMENT   Patient Name: Rhonda Munoz MRN: [id number] DOB:April 09, 1956, 67 y.o., female Today's Date: 06/17/2023  END OF SESSION:   Past Medical History:  Diagnosis Date   Allergy    Cataract    Depression    GERD (gastroesophageal reflux disease)    Hemorrhoids    Hyperlipidemia    Past Surgical History:  Procedure Laterality Date   ABDOMINAL HYSTERECTOMY     CATARACT EXTRACTION, BILATERAL Bilateral 08/2022   second one 09/2022   CHOLECYSTECTOMY     EYE SURGERY  2024   Cataract   FINGER SURGERY Right    index finger x 1, middle finger x 2   GALLBLADDER SURGERY     TONSILLECTOMY     TUBAL LIGATION     Patient Active Problem List   Diagnosis Date Noted   Benign paroxysmal positional vertigo 01/04/2023   Yeast dermatitis 07/02/2022   Depression 06/05/2021   Oral candidiasis 09/06/2020   Osteopenia 04/26/2020   Hypercalcemia 01/02/2020   Hyperlipemia 01/02/2020   Prediabetes 01/02/2020   Postmenopausal 12/13/2019   Knee pain, left 09/28/2017   Gastroesophageal reflux disease with esophagitis 05/18/2017   History of colon polyps 05/18/2017   PCP: Everrett Coombe, DO REFERRING PROVIDER: Everrett Coombe, DO REFERRING DIAG: H81.10 (ICD-10-CM) - Benign paroxysmal positional vertigo, unspecified laterality  THERAPY DIAG:  No diagnosis found.  ONSET DATE: 04/30/2023 Rationale for Evaluation and Treatment: Rehabilitation  SUBJECTIVE:   SUBJECTIVE STATEMENT: ***The patient reports she thinks that vertigo is resolved. The exercises have been going well. Sometimes she notes imbalance.   EVAL:The patient is known to our clinic from prior treatment for vertigo. She notes noticing spinning sensation when looking up at light fixtures in [place] a couple of weeks ago, then felt worse when getting a bone density scan. She noticed nausea and dizziness on Saturday when cleaning her home. She attempted to go back to prior exercises and notes  the link had closed out. The patient has had h/o intermittent vertigo. Her initial episode sounds like constant vertigo years ago and may be more consistent with a vestibular neuritis.  Pt accompanied by: self  PERTINENT HISTORY: Cateracts surgery (November, January), Stygmatism R eye,   PAIN:  Are you having pain? No  PRECAUTIONS: None  WEIGHT BEARING RESTRICTIONS: No  FALLS: Has patient fallen in last 6 months? No  PATIENT GOALS: Reduce dizziness  OBJECTIVE:  (Measures in this section from initial evaluation unless otherwise noted)  VESTIBULAR ASSESSMENT:  GENERAL OBSERVATION: Moves independently without a device   SYMPTOM BEHAVIOR:  Subjective history: Sudden onset of dizziness last week, h/o intermittent vertigo  Non-Vestibular symptoms:  n/a  Type of dizziness: Spinning/Vertigo   Frequency: daily  Duration: seconds to minutes  Aggravating factors: Induced by position change and looking up  Relieving factors: lying supine  Progression of symptoms: worse  OCULOMOTOR EXAM:  Ocular Alignment: normal  Ocular ROM: No Limitations  Spontaneous Nystagmus: absent  Gaze-Induced Nystagmus: absent  Smooth Pursuits: intact, feels a trace of nausea  Saccades: intact, provokes a sensation of nausea   VESTIBULAR - OCULAR REFLEX:   Slow VOR: Normal at slow pace provoking nausea  VOR Cancellation: Normal  Head-Impulse Test: HIT Right: Positive for refixation saccade. HIT Left: able to maintain fixation  Dynamic Visual Acuity:  not assessed today   POSITIONAL TESTING: Right Dix-Hallpike: upbeating, right nystagmus Left Dix-Hallpike: upbeating, left nystagmus Right Roll Test: not tested today-- severe symptoms with bilat dix hallpike and know there is multi-canal  bppv-- will treat posterior canals first and then horizontal canals Left Roll Test: to be further assessed. Right Sidelying: latency with trace of spinning that resolved within 1-2 seconds so direction not viewed in room  light, no nausea Left Sidelying: 4/10 nausea, no sensation of spinning. Gets spinning sensation that lasts for seconds with nausea when returning to sitting.   [facility] Adult PT Treatment:                                                DATE: 06/17/23 Therapeutic Exercise: Check loing term goals  Manual Therapy: *** Neuromuscular re-ed: *** Therapeutic Activity: *** Gait: *** Modalities: *** Self Care: ***   [facility] Adult PT Treatment:                                                DATE: 06/08/23 Neuromuscular re-ed: Positional Testing: R trace positional vertigo after 10 second latency with R dix hallpike L dix hallpike negative Habituation  Brandt daroff to the R side with trace  Rolling R<>L without sensation of vertigo Standing with feet together 5 horizontal head turns Standing with feet together 3 vertical head turns *gets nausea with standing head motions CANOLITH REPOSITIONING MANEUVER: R epley's maneuver Corner balance standing on solid ground with eyes closed x 30 seconds Single leg standing near support surface Sit<>stand with eyes closed x 3 reps near support, but not relying on external support as able   [facility] Adult PT Treatment:                                                DATE: 05/25/23 Neuromuscular re-ed: Habituation Austin Miles -- patient gets mild symptoms to the right side and when returning to upright; negative to the left Positional testing: R dix hallpike: trace  L dix hallpike negative Horizontal roll R negative Horizontal roll L negative  CANOLITH REPOSITIONING MANEUVER: R epley's maneuver Self Care: Discussed doing prior HEP of corner standing eyes closed Discussed habituation and doing until 2 days symptom free D/c gaze due to patient without difficulty   [facility] Adult PT Treatment:                                                DATE: 05/11/23  Neuromuscular re-ed: Habituation Supine rolling R<>L x 3 reps Moving to the right side provokes  more apprehension and sensation it could come on, patient also gets some nausea rated 6/10 Canolith repositioning Epley's maneuver Right side based on symptoms today   PATIENT EDUCATION: Education details: HEP Person educated: Patient Education method: Chief Technology Officer Education comprehension: verbalized understanding  HOME EXERCISE PROGRAM: Access Code: [email or web address] URL: [email or web address] Date: 05/11/2023 Prepared by: Margretta Ditty  Exercises - Rolling rightleft sides for vestibular habituation  - 2 x daily - 7 x weekly - 1 sets - 5 reps - Seated Gaze Stabilization with Head Rotation  - 1 x daily - 7 x weekly - 1 sets - 10  reps  GOALS: Goals reviewed with patient? Yes  LONG TERM GOALS: Target date:06/10/23  Patient will be independent with progressed HEP to improve outcomes and carryover.  Baseline:  none established at eval Goal status: INITIAL  2.  Patient will report 80% improvement in dizziness. Baseline: dizziness limits daily activities Goal status: INITIAL  3.  The patient will have negative positional testing indicating resolution of BPPV. Baseline:  + bilateral dix hallpike-- could not test horizontal canal at eval due to severity of sxs. Goal status: INITIAL  4.  Patient will report resolution of dizziness with looking up.  Baseline:  Notes dizziness with head motion. Goal status: INITIAL  ASSESSMENT:  CLINICAL IMPRESSION: ***The patient continues with trace BPPV on the right side. She gets nausea with head turns for habituation. PT encouraged her to perform at low frequency in order to continue to work through symptoms. PT to continue progressing multi-sensory balance challenge.   EVAL: Patient is a 67 y.o. female who was seen today for physical therapy evaluation and treatment of vertigo. In addition, the patient was + for head impulse testing indicating dec'd use of VOR. The patient has nausea with movement today and she rests  frequently t/o the evaluation in order to allow nausea to settle. PT initiated treatment for bppv based on symptoms to the right and habituation. We also reviewed prior gaze adaptation exercise. PT to address positional symptoms and then progress to gaze adaptation and balance activities as needed.    OBJECTIVE IMPAIRMENTS: decreased activity tolerance, decreased balance, dizziness, and impaired vision/preception.   PLAN:  PT FREQUENCY: 2x/week  PT DURATION: 4 weeks  PLANNED INTERVENTIONS: Therapeutic exercises, Therapeutic activity, Neuromuscular re-education, Balance training, Gait training, Patient/Family education, Self Care, Vestibular training, Canalith repositioning, Visual/preceptual remediation/compensation, and Manual therapy  PLAN FOR NEXT SESSION: CHECK LONG TERM GOALS AND RENEW. Multi-sensory balance activities, reassess canals and treat BPPV, progress gaze adaptation and habituation as indicated   Austan Nicholl, PT 06/17/2023, 8:13 AM

## 2023-07-13 ENCOUNTER — Ambulatory Visit (INDEPENDENT_AMBULATORY_CARE_PROVIDER_SITE_OTHER): Payer: Medicare Other | Admitting: Medical-Surgical

## 2023-07-13 ENCOUNTER — Ambulatory Visit: Payer: Medicare Other | Admitting: Family Medicine

## 2023-07-13 ENCOUNTER — Encounter: Payer: Self-pay | Admitting: Medical-Surgical

## 2023-07-13 VITALS — BP 137/81 | HR 87 | Temp 99.4°F | Resp 20 | Ht 67.0 in | Wt 209.4 lb

## 2023-07-13 DIAGNOSIS — J0100 Acute maxillary sinusitis, unspecified: Secondary | ICD-10-CM

## 2023-07-13 MED ORDER — FLUCONAZOLE 150 MG PO TABS: 150.0000 mg | ORAL_TABLET | Freq: Once | ORAL | 0 refills | Status: AC

## 2023-07-13 MED ORDER — AMOXICILLIN-POT CLAVULANATE 875-125 MG PO TABS: 1.0000 | ORAL_TABLET | Freq: Two times a day (BID) | ORAL | 0 refills | Status: DC

## 2023-07-13 MED ORDER — DEXAMETHASONE 4 MG PO TABS: 4.0000 mg | ORAL_TABLET | Freq: Two times a day (BID) | ORAL | 0 refills | Status: DC

## 2023-07-13 NOTE — Progress Notes (Signed)
        Established patient visit  History, exam, impression, and plan:  1. Acute non-recurrent maxillary sinusitis Pleasant 67 year old female presenting today with reports of 6-7 days of upper respiratory symptoms that started as a scratchy throat on awakening but quickly progressed to a Tmax of 100.7, bilateral eye pain, rhinorrhea, postnasal drip, cough, thick yellow nasal discharge, and facial swelling along the bilateral cheeks.  She has been using Robitussin with honey, drinking hot tea with honey, and Mucinex without benefit.  Exam consistent with maxillary sinusitis.  Treating with Augmentin twice daily x 10 days.  Adding Diflucan for VVC prophylaxis.  She has Promethazine DM at home that is still good and declined a refill at this time.  Adding Decadron 4 mg twice daily x 5 days due to severity of symptoms.   Review of Systems  Constitutional:  Positive for fever and malaise/fatigue. Negative for chills.  HENT:  Positive for congestion, sinus pain and sore throat.        Left ear "clogged" up, rhinorrhea  Eyes:  Positive for pain.  Respiratory:  Positive for cough, sputum production (yellow) and shortness of breath.   Musculoskeletal:  Positive for myalgias.  Neurological:  Positive for headaches.    Physical Exam Vitals reviewed.  Constitutional:      General: She is not in acute distress.    Appearance: Normal appearance. She is ill-appearing.  HENT:     Head: Normocephalic and atraumatic.     Right Ear: Ear canal and external ear normal. A middle ear effusion (clear) is present.     Left Ear: Ear canal and external ear normal. A middle ear effusion (clear) is present.     Nose:     Right Sinus: Maxillary sinus tenderness and frontal sinus tenderness present.     Left Sinus: Maxillary sinus tenderness (left > right) and frontal sinus tenderness (left > right) present.  Cardiovascular:     Rate and Rhythm: Normal rate and regular rhythm.     Pulses: Normal pulses.      Heart sounds: Normal heart sounds. No murmur heard.    No friction rub. No gallop.  Pulmonary:     Effort: Pulmonary effort is normal. No respiratory distress.     Breath sounds: Normal breath sounds. No wheezing.  Skin:    General: Skin is warm and dry.  Neurological:     Mental Status: She is alert and oriented to person, place, and time.  Psychiatric:        Mood and Affect: Mood normal.        Behavior: Behavior normal.        Thought Content: Thought content normal.        Judgment: Judgment normal.   Procedures performed this visit: None.  Return if symptoms worsen or fail to improve.  __________________________________ Thayer Ohm, DNP, APRN, FNP-BC Primary Care and Sports Medicine [facility] [place]

## 2023-07-15 ENCOUNTER — Encounter: Payer: Self-pay | Admitting: Family Medicine

## 2023-07-15 ENCOUNTER — Ambulatory Visit (INDEPENDENT_AMBULATORY_CARE_PROVIDER_SITE_OTHER): Payer: Medicare Other | Admitting: Family Medicine

## 2023-07-15 VITALS — BP 113/74 | HR 83 | Ht 67.0 in | Wt 209.4 lb

## 2023-07-15 DIAGNOSIS — R7303 Prediabetes: Secondary | ICD-10-CM

## 2023-07-15 DIAGNOSIS — F324 Major depressive disorder, single episode, in partial remission: Secondary | ICD-10-CM

## 2023-07-15 DIAGNOSIS — J019 Acute sinusitis, unspecified: Secondary | ICD-10-CM | POA: Diagnosis not present

## 2023-07-15 DIAGNOSIS — M8580 Other specified disorders of bone density and structure, unspecified site: Secondary | ICD-10-CM | POA: Diagnosis not present

## 2023-07-15 DIAGNOSIS — E559 Vitamin D deficiency, unspecified: Secondary | ICD-10-CM

## 2023-07-15 DIAGNOSIS — Z13220 Encounter for screening for lipoid disorders: Secondary | ICD-10-CM

## 2023-07-15 MED ORDER — DESVENLAFAXINE SUCCINATE ER 50 MG PO TB24: 50.0000 mg | ORAL_TABLET | Freq: Every day | ORAL | 0 refills | Status: DC

## 2023-07-15 NOTE — Assessment & Plan Note (Signed)
She will complete course of prednisone and Augmentin.  Instructed to contact clinic if symptoms worsen again.

## 2023-07-15 NOTE — Assessment & Plan Note (Signed)
Given instructions on how to wean from Pristiq.

## 2023-07-15 NOTE — Progress Notes (Signed)
Rhonda Munoz - 67 y.o. female MRN [id number]  Date of birth: 08-12-1956  Subjective Chief Complaint  Patient presents with   Annual Exam    HPI Rhonda Munoz is a 67 y.o. female here today for follow up visit.   Recently seen for sinusitis.  Treated with augmentin and prednisone.  Reports that she is improving at this point.  She would like to try weaning from pristiq.  She has been on this for quite sometime.  She is involved in several groups in her church as well as stay of community.  Does not feel like she needs to continue this at this point.  She would like to have updated labs today.  ROS:  A comprehensive ROS was completed and negative except as noted per HPI  Allergies  Allergen Reactions   Clenpiq [Sod Picosulfate-Mag Ox-Cit Acd]     Sweating, vomiting and bad headache   Codeine     Nausea, Rash   Effexor [Venlafaxine]     VISUAL HALLUCINATIONS   Hydromorphone     Tachycardia, rash   Procaine     Nausea, Rash    Past Medical History:  Diagnosis Date   Allergy    Cataract    Depression    GERD (gastroesophageal reflux disease)    Hemorrhoids    Hyperlipidemia     Past Surgical History:  Procedure Laterality Date   ABDOMINAL HYSTERECTOMY     CATARACT EXTRACTION, BILATERAL Bilateral 08/2022   second one 09/2022   CHOLECYSTECTOMY     EYE SURGERY  2024   Cataract   FINGER SURGERY Right    index finger x 1, middle finger x 2   GALLBLADDER SURGERY     TONSILLECTOMY     TUBAL LIGATION      Social History   Socioeconomic History   Marital status: Widowed    Spouse name: Not on file   Number of children: 2   Years of education: 14   Highest education level: Associate degree: academic program  Occupational History   Occupation: Retired  Tobacco Use   Smoking status: Never   Smokeless tobacco: Never  Vaping Use   Vaping status: Never Used  Substance and Sexual Activity   Alcohol use: Never   Drug use: Never   Sexual activity: Not  Currently    Partners: Male    Birth control/protection: Post-menopausal, Surgical  Other Topics Concern   Not on file  Social History Narrative   Lives with her dog. She has two sons. She enjoys reading, social gatherings at church and spending time with her grandchildren. Her sons live in [place].   Social Determinants of Health   Financial Resource Strain: Low Risk  (07/11/2023)   Overall Financial Resource Strain (CARDIA)    Difficulty of Paying Living Expenses: Not hard at all  Food Insecurity: No Food Insecurity (07/11/2023)   Hunger Vital Sign    Worried About Running Out of Food in the Last Year: Never true    Ran Out of Food in the Last Year: Never true  Transportation Needs: No Transportation Needs (07/11/2023)   PRAPARE - Administrator, Civil Service (Medical): No    Lack of Transportation (Non-Medical): No  Physical Activity: Insufficiently Active (07/11/2023)   Exercise Vital Sign    Days of Exercise per Week: 4 days    Minutes of Exercise per Session: 20 min  Stress: No Stress Concern Present (07/11/2023)   [employer] of Occupational Health -  Occupational Stress Questionnaire    Feeling of Stress : Not at all  Social Connections: Moderately Integrated (07/11/2023)   Social Connection and Isolation Panel [NHANES]    Frequency of Communication with Friends and Family: More than three times a week    Frequency of Social Gatherings with Friends and Family: More than three times a week    Attends Religious Services: More than 4 times per year    Active Member of [employer] or Organizations: Yes    Attends Banker Meetings: More than 4 times per year    Marital Status: Widowed    Family History  Problem Relation Age of Onset   Breast cancer Mother    Hypertension Mother    Stroke Mother    Colon polyps Mother    Arthritis Mother    Asthma Mother    Cancer Mother    Hearing loss Mother    Miscarriages / [place] Mother     Obesity Mother    Diabetes Father    Heart attack Father    Heart disease Father    Hypertension Father    Hyperlipidemia Father    Arthritis Father    Obesity Father    Varicose Veins Father    Cancer Sister    Colon cancer Sister    Heart failure Maternal Grandmother    Colon cancer Maternal Grandfather    Heart failure Paternal Grandmother    Cancer Sister    Esophageal cancer Neg Hx    Rectal cancer Neg Hx    Stomach cancer Neg Hx     Health Maintenance  Topic Date Due   COVID-19 Vaccine (4 - 2023-24 season) 07/29/2023 (Originally 04/26/2023)   Hepatitis C Screening  05/31/2024 (Originally 07/07/1976)   HIV Screening  05/31/2024 (Originally 07/07/1973)   Pneumonia Vaccine 80+ Years old (1 of 1 - PCV) 07/12/2024 (Originally 07/08/2023)   Medicare Annual Wellness (AWV)  05/31/2024   DTaP/Tdap/Td (3 - Td or Tdap) 01/12/2025   MAMMOGRAM  05/19/2025   DEXA SCAN  04/21/2026   Colonoscopy  05/21/2028   INFLUENZA VACCINE  Completed   Zoster Vaccines- Shingrix  Completed   HPV VACCINES  Aged Out     ----------------------------------------------------------------------------------------------------------------------------------------------------------------------------------------------------------------- Physical Exam BP 113/74 (BP Location: Left Arm, Patient Position: Sitting, Cuff Size: Large)   Pulse 83   Ht 5\' 7"  (1.702 m)   Wt 209 lb 6.4 oz (95 kg)   SpO2 99%   BMI 32.80 kg/m   Physical Exam Constitutional:      Appearance: Normal appearance.  Eyes:     General: No scleral icterus. Cardiovascular:     Rate and Rhythm: Normal rate and regular rhythm.  Pulmonary:     Effort: Pulmonary effort is normal.     Breath sounds: Normal breath sounds.  Musculoskeletal:     Cervical back: Neck supple.  Neurological:     Mental Status: She is alert.  Psychiatric:        Mood and Affect: Mood normal.        Behavior: Behavior normal.     ------------------------------------------------------------------------------------------------------------------------------------------------------------------------------------------------------------------- Assessment and Plan  Acute sinusitis She will complete course of prednisone and Augmentin.  Instructed to contact clinic if symptoms worsen again.    Prediabetes Updating A1c today.  Hypercalcemia Recheck calcium and vitamin D levels.  Depression Given instructions on how to wean from Pristiq.   Meds ordered this encounter  Medications   DISCONTD: desvenlafaxine (PRISTIQ) 50 MG 24 hr tablet    Sig: Take  1 tablet (50 mg total) by mouth daily. Weaning from Pristiq    Dispense:  10 tablet    Refill:  0   desvenlafaxine (PRISTIQ) 50 MG 24 hr tablet    Sig: Take 1 tablet (50 mg total) by mouth daily. Weaning from Pristiq    Dispense:  21 tablet    Refill:  0    Corrected rx.    No follow-ups on file.    This visit occurred during the SARS-CoV-2 public health emergency.  Safety protocols were in place, including screening questions prior to the visit, additional usage of staff PPE, and extensive cleaning of exam room while observing appropriate contact time as indicated for disinfecting solutions.

## 2023-07-15 NOTE — Assessment & Plan Note (Signed)
Recheck calcium and vitamin D levels.

## 2023-07-15 NOTE — Patient Instructions (Addendum)
Reduce pristiq to 1/2 tab daily for the next 7 days. Take 1/2 of 50mg  tab (25mg ) for 7 days then every other day for 14 days.

## 2023-07-15 NOTE — Assessment & Plan Note (Signed)
Updating A1c today. ?

## 2023-07-17 ENCOUNTER — Encounter: Payer: Self-pay | Admitting: Medical-Surgical

## 2023-07-17 ENCOUNTER — Ambulatory Visit (INDEPENDENT_AMBULATORY_CARE_PROVIDER_SITE_OTHER): Payer: Medicare Other | Admitting: Medical-Surgical

## 2023-07-17 ENCOUNTER — Ambulatory Visit: Payer: Medicare Other

## 2023-07-17 VITALS — BP 150/79 | HR 80 | Temp 99.3°F | Resp 20 | Ht 67.0 in | Wt 209.2 lb

## 2023-07-17 DIAGNOSIS — R051 Acute cough: Secondary | ICD-10-CM

## 2023-07-17 DIAGNOSIS — J019 Acute sinusitis, unspecified: Secondary | ICD-10-CM

## 2023-07-17 MED ORDER — METHYLPREDNISOLONE SODIUM SUCC 125 MG IJ SOLR
125.0000 mg | Freq: Once | INTRAMUSCULAR | Status: AC
  Administered 2023-07-17: 125 mg via INTRAMUSCULAR

## 2023-07-17 MED ORDER — ALBUTEROL SULFATE HFA 108 (90 BASE) MCG/ACT IN AERS
2.0000 | INHALATION_SPRAY | Freq: Four times a day (QID) | RESPIRATORY_TRACT | 0 refills | Status: DC | PRN
Start: 2023-07-17 — End: 2023-08-17

## 2023-07-17 MED ORDER — PROMETHAZINE-DM 6.25-15 MG/5ML PO SYRP
5.0000 mL | ORAL_SOLUTION | ORAL | 0 refills | Status: DC | PRN
Start: 2023-07-17 — End: 2023-08-17

## 2023-07-17 MED ORDER — AZITHROMYCIN 250 MG PO TABS
ORAL_TABLET | ORAL | 0 refills | Status: AC
Start: 2023-07-17 — End: 2023-07-22

## 2023-07-19 ENCOUNTER — Encounter: Payer: Self-pay | Admitting: Medical-Surgical

## 2023-07-27 ENCOUNTER — Ambulatory Visit (INDEPENDENT_AMBULATORY_CARE_PROVIDER_SITE_OTHER): Payer: Medicare Other | Admitting: Family Medicine

## 2023-07-27 ENCOUNTER — Encounter: Payer: Self-pay | Admitting: Family Medicine

## 2023-07-27 VITALS — BP 112/74 | HR 89 | Ht 67.0 in | Wt 210.0 lb

## 2023-07-27 DIAGNOSIS — R052 Subacute cough: Secondary | ICD-10-CM

## 2023-07-27 DIAGNOSIS — R059 Cough, unspecified: Secondary | ICD-10-CM | POA: Insufficient documentation

## 2023-07-27 DIAGNOSIS — R350 Frequency of micturition: Secondary | ICD-10-CM

## 2023-07-27 DIAGNOSIS — B370 Candidal stomatitis: Secondary | ICD-10-CM | POA: Diagnosis not present

## 2023-07-27 DIAGNOSIS — B3731 Acute candidiasis of vulva and vagina: Secondary | ICD-10-CM | POA: Diagnosis not present

## 2023-07-27 LAB — POCT URINALYSIS DIP (CLINITEK)
Bilirubin, UA: NEGATIVE
Blood, UA: NEGATIVE
Glucose, UA: NEGATIVE mg/dL
Nitrite, UA: NEGATIVE
POC PROTEIN,UA: NEGATIVE
Spec Grav, UA: 1.02 (ref 1.010–1.025)
Urobilinogen, UA: 0.2 U/dL
pH, UA: 6 (ref 5.0–8.0)

## 2023-07-27 MED ORDER — BENZONATATE 200 MG PO CAPS: 200.0000 mg | ORAL_CAPSULE | Freq: Two times a day (BID) | ORAL | 2 refills | Status: DC | PRN

## 2023-07-27 MED ORDER — NYSTATIN 100000 UNIT/ML MT SUSP: 5.0000 mL | Freq: Four times a day (QID) | OROMUCOSAL | 0 refills | Status: AC

## 2023-07-27 MED ORDER — FLUCONAZOLE 150 MG PO TABS: 150.0000 mg | ORAL_TABLET | Freq: Once | ORAL | 0 refills | Status: AC

## 2023-07-27 NOTE — Assessment & Plan Note (Addendum)
Continue cough.  Pulmonary exam is unremarkable.  I reviewed her chest x-ray which appears unremarkable.  Still awaiting radiology overread.  Adding Tessalon Perles.

## 2023-07-27 NOTE — Assessment & Plan Note (Signed)
Repeat dose of fluconazole.

## 2023-07-27 NOTE — Assessment & Plan Note (Signed)
Nystatin swish and swallow

## 2023-07-27 NOTE — Progress Notes (Signed)
Rhonda Munoz - 67 y.o. female MRN [id number]  Date of birth: 09-03-1955  Subjective Chief Complaint  Patient presents with   Vaginitis   Urinary Tract Infection    HPI Rhonda Munoz is a 67 y.o. female here today for follow up.  She reports overall she is feeling better but continues to have cough.  No dyspnea or wheezing.  She did have chest x-ray recently at visit with Joy.  Read is still pending.  She has not really used a whole lot of the cough syrup because it makes her feel tired.  She is staying well-hydrated.  She thinks she may have thrush as well as yeast infection from antibiotics.  She is having some vaginal itching and irritation.  She also has sore white patches in her mouth and along the cheeks.  She did take Diflucan x 1 a couple days ago but symptoms are not resolved.  ROS:  A comprehensive ROS was completed and negative except as noted per HPI  Allergies  Allergen Reactions   Clenpiq [Sod Picosulfate-Mag Ox-Cit Acd]     Sweating, vomiting and bad headache   Codeine     Nausea, Rash   Effexor [Venlafaxine]     VISUAL HALLUCINATIONS   Hydromorphone     Tachycardia, rash   Procaine     Nausea, Rash    Past Medical History:  Diagnosis Date   Allergy    Cataract    Depression    GERD (gastroesophageal reflux disease)    Hemorrhoids    Hyperlipidemia     Past Surgical History:  Procedure Laterality Date   ABDOMINAL HYSTERECTOMY     CATARACT EXTRACTION, BILATERAL Bilateral 08/2022   second one 09/2022   CHOLECYSTECTOMY     EYE SURGERY  2024   Cataract   FINGER SURGERY Right    index finger x 1, middle finger x 2   GALLBLADDER SURGERY     TONSILLECTOMY     TUBAL LIGATION      Social History   Socioeconomic History   Marital status: Widowed    Spouse name: Not on file   Number of children: 2   Years of education: 14   Highest education level: Associate degree: academic program  Occupational History   Occupation: Retired  Tobacco Use    Smoking status: Never   Smokeless tobacco: Never  Vaping Use   Vaping status: Never Used  Substance and Sexual Activity   Alcohol use: Never   Drug use: Never   Sexual activity: Not Currently    Partners: Male    Birth control/protection: Post-menopausal, Surgical  Other Topics Concern   Not on file  Social History Narrative   Lives with her dog. She has two sons. She enjoys reading, social gatherings at church and spending time with her grandchildren. Her sons live in [place].   Social Determinants of Health   Financial Resource Strain: Low Risk  (07/11/2023)   Overall Financial Resource Strain (CARDIA)    Difficulty of Paying Living Expenses: Not hard at all  Food Insecurity: No Food Insecurity (07/11/2023)   Hunger Vital Sign    Worried About Running Out of Food in the Last Year: Never true    Ran Out of Food in the Last Year: Never true  Transportation Needs: No Transportation Needs (07/11/2023)   PRAPARE - Administrator, Civil Service (Medical): No    Lack of Transportation (Non-Medical): No  Physical Activity: Insufficiently Active (07/11/2023)   Exercise  Vital Sign    Days of Exercise per Week: 4 days    Minutes of Exercise per Session: 20 min  Stress: No Stress Concern Present (07/11/2023)   [employer] of Occupational Health - Occupational Stress Questionnaire    Feeling of Stress : Not at all  Social Connections: Moderately Integrated (07/11/2023)   Social Connection and Isolation Panel [NHANES]    Frequency of Communication with Friends and Family: More than three times a week    Frequency of Social Gatherings with Friends and Family: More than three times a week    Attends Religious Services: More than 4 times per year    Active Member of [employer] or Organizations: Yes    Attends Banker Meetings: More than 4 times per year    Marital Status: Widowed    Family History  Problem Relation Age of Onset   Breast cancer  Mother    Hypertension Mother    Stroke Mother    Colon polyps Mother    Arthritis Mother    Asthma Mother    Cancer Mother    Hearing loss Mother    Miscarriages / Stillbirths Mother    Obesity Mother    Diabetes Father    Heart attack Father    Heart disease Father    Hypertension Father    Hyperlipidemia Father    Arthritis Father    Obesity Father    Varicose Veins Father    Cancer Sister    Colon cancer Sister    Heart failure Maternal Grandmother    Colon cancer Maternal Grandfather    Heart failure Paternal Grandmother    Cancer Sister    Esophageal cancer Neg Hx    Rectal cancer Neg Hx    Stomach cancer Neg Hx     Health Maintenance  Topic Date Due   COVID-19 Vaccine (4 - 2023-24 season) 07/29/2023 (Originally 04/26/2023)   Hepatitis C Screening  05/31/2024 (Originally 07/07/1976)   HIV Screening  05/31/2024 (Originally 07/07/1973)   Pneumonia Vaccine 26+ Years old (1 of 1 - PCV) 07/12/2024 (Originally 07/08/2023)   Medicare Annual Wellness (AWV)  05/31/2024   DTaP/Tdap/Td (3 - Td or Tdap) 01/12/2025   MAMMOGRAM  05/19/2025   DEXA SCAN  04/21/2026   Colonoscopy  05/21/2028   INFLUENZA VACCINE  Completed   Zoster Vaccines- Shingrix  Completed   HPV VACCINES  Aged Out     ----------------------------------------------------------------------------------------------------------------------------------------------------------------------------------------------------------------- Physical Exam BP 112/74 (BP Location: Left Arm, Patient Position: Sitting, Cuff Size: Normal)   Pulse 89   Ht 5\' 7"  (1.702 m)   Wt 210 lb (95.3 kg)   SpO2 98%   BMI 32.89 kg/m   Physical Exam Constitutional:      Appearance: Normal appearance.  HENT:     Mouth/Throat:     Comments: White patches all buccal mucosa bilaterally as well as the tongue. Eyes:     General: No scleral icterus. Cardiovascular:     Rate and Rhythm: Normal rate and regular rhythm.  Pulmonary:      Effort: Pulmonary effort is normal.     Breath sounds: Normal breath sounds.  Musculoskeletal:     Cervical back: Neck supple.  Neurological:     Mental Status: She is alert.  Psychiatric:        Mood and Affect: Mood normal.        Behavior: Behavior normal.     ------------------------------------------------------------------------------------------------------------------------------------------------------------------------------------------------------------------- Assessment and Plan  Vulvovaginal candidiasis Repeat dose of fluconazole.  Thrush Nystatin  swish and swallow.  Cough Continue cough.  Pulmonary exam is unremarkable.  I reviewed her chest x-ray which appears unremarkable.  Still awaiting radiology overread.   Meds ordered this encounter  Medications   fluconazole (DIFLUCAN) 150 MG tablet    Sig: Take 1 tablet (150 mg total) by mouth once for 1 dose.    Dispense:  1 tablet    Refill:  0   nystatin (MYCOSTATIN) 100000 UNIT/ML suspension    Sig: Take 5 mLs (500,000 Units total) by mouth 4 (four) times daily for 7 days.    Dispense:  150 mL    Refill:  0   benzonatate (TESSALON) 200 MG capsule    Sig: Take 1 capsule (200 mg total) by mouth 2 (two) times daily as needed for cough.    Dispense:  30 capsule    Refill:  2    No follow-ups on file.    This visit occurred during the SARS-CoV-2 public health emergency.  Safety protocols were in place, including screening questions prior to the visit, additional usage of staff PPE, and extensive cleaning of exam room while observing appropriate contact time as indicated for disinfecting solutions.

## 2023-07-29 ENCOUNTER — Encounter: Payer: Self-pay | Admitting: Family Medicine

## 2023-07-29 LAB — URINE CULTURE

## 2023-08-03 ENCOUNTER — Ambulatory Visit: Payer: Medicare Other

## 2023-08-11 ENCOUNTER — Encounter: Payer: Self-pay | Admitting: Family Medicine

## 2023-08-12 ENCOUNTER — Other Ambulatory Visit: Payer: Self-pay | Admitting: Family Medicine

## 2023-08-12 ENCOUNTER — Telehealth: Payer: Self-pay

## 2023-08-12 ENCOUNTER — Ambulatory Visit (INDEPENDENT_AMBULATORY_CARE_PROVIDER_SITE_OTHER): Payer: Medicare Other

## 2023-08-12 DIAGNOSIS — Z23 Encounter for immunization: Secondary | ICD-10-CM | POA: Diagnosis not present

## 2023-08-12 MED ORDER — DESVENLAFAXINE SUCCINATE ER 25 MG PO TB24: 25.0000 mg | ORAL_TABLET | Freq: Every day | ORAL | 0 refills | Status: DC

## 2023-08-12 NOTE — Telephone Encounter (Signed)
Rhonda Munoz is coming in today for pneumonia vaccine. Is it ok to give and which pneumonia vaccine?

## 2023-08-12 NOTE — Progress Notes (Signed)
   Established Patient Office Visit  Subjective   Patient ID: Rhonda Munoz, female    DOB: 1956/02/20  Age: 67 y.o. MRN: [id number]  No chief complaint on file.   HPI  Rhonda Munoz is here for pneumonia Prevnar 20 vaccine.   ROS    Objective:     There were no vitals taken for this visit.   Physical Exam   No results found for any visits on 08/12/23.    The 10-year ASCVD risk score (Arnett DK, et al., 2019) is: 3.6%    Assessment & Plan:  Vaccine - Patient tolerated injection well without complications.    Problem List Items Addressed This Visit   None Visit Diagnoses       Immunization due    -  Primary   Relevant Orders   Pneumococcal conjugate vaccine 20-valent (Prevnar 20) (Completed)       No follow-ups on file.    Esmond Harps, CMA

## 2023-08-13 LAB — CBC WITH DIFFERENTIAL/PLATELET
Basophils Absolute: 0.1 10*3/uL (ref 0.0–0.2)
Basos: 1 %
EOS (ABSOLUTE): 0.5 10*3/uL — ABNORMAL HIGH (ref 0.0–0.4)
Eos: 8 %
Hematocrit: 39.6 % (ref 34.0–46.6)
Hemoglobin: 12.3 g/dL (ref 11.1–15.9)
Immature Grans (Abs): 0 10*3/uL (ref 0.0–0.1)
Immature Granulocytes: 0 %
Lymphocytes Absolute: 2.3 10*3/uL (ref 0.7–3.1)
Lymphs: 34 %
MCH: 27.8 pg (ref 26.6–33.0)
MCHC: 31.1 g/dL — ABNORMAL LOW (ref 31.5–35.7)
MCV: 90 fL (ref 79–97)
Monocytes Absolute: 0.4 10*3/uL (ref 0.1–0.9)
Monocytes: 6 %
Neutrophils Absolute: 3.4 10*3/uL (ref 1.4–7.0)
Neutrophils: 51 %
Platelets: 304 10*3/uL (ref 150–450)
RBC: 4.42 x10E6/uL (ref 3.77–5.28)
RDW: 13 % (ref 11.7–15.4)
WBC: 6.6 10*3/uL (ref 3.4–10.8)

## 2023-08-13 LAB — CMP14+EGFR
ALT: 21 [IU]/L (ref 0–32)
AST: 22 [IU]/L (ref 0–40)
Albumin: 4.3 g/dL (ref 3.9–4.9)
Alkaline Phosphatase: 109 [IU]/L (ref 44–121)
BUN/Creatinine Ratio: 12 (ref 12–28)
BUN: 9 mg/dL (ref 8–27)
Bilirubin Total: 0.3 mg/dL (ref 0.0–1.2)
CO2: 23 mmol/L (ref 20–29)
Calcium: 9.6 mg/dL (ref 8.7–10.3)
Chloride: 100 mmol/L (ref 96–106)
Creatinine, Ser: 0.76 mg/dL (ref 0.57–1.00)
Globulin, Total: 2.7 g/dL (ref 1.5–4.5)
Glucose: 141 mg/dL — ABNORMAL HIGH (ref 70–99)
Potassium: 4.5 mmol/L (ref 3.5–5.2)
Sodium: 139 mmol/L (ref 134–144)
Total Protein: 7 g/dL (ref 6.0–8.5)
eGFR: 87 mL/min/{1.73_m2} (ref >59)

## 2023-08-13 LAB — HEMOGLOBIN A1C
Est. average glucose Bld gHb Est-mCnc: 166 mg/dL
Hgb A1c MFr Bld: 7.4 % — ABNORMAL HIGH (ref 4.8–5.6)

## 2023-08-13 LAB — LIPID PANEL WITH LDL/HDL RATIO
Cholesterol, Total: 194 mg/dL (ref 100–199)
HDL: 50 mg/dL (ref >39)
LDL Chol Calc (NIH): 106 mg/dL — ABNORMAL HIGH (ref 0–99)
LDL/HDL Ratio: 2.1 {ratio} (ref 0.0–3.2)
Triglycerides: 220 mg/dL — ABNORMAL HIGH (ref 0–149)
VLDL Cholesterol Cal: 38 mg/dL (ref 5–40)

## 2023-08-13 LAB — TSH: TSH: 2.46 u[IU]/mL (ref 0.450–4.500)

## 2023-08-13 LAB — VITAMIN D 25 HYDROXY (VIT D DEFICIENCY, FRACTURES): Vit D, 25-Hydroxy: 48.4 ng/mL (ref 30.0–100.0)

## 2023-08-17 ENCOUNTER — Ambulatory Visit (INDEPENDENT_AMBULATORY_CARE_PROVIDER_SITE_OTHER): Payer: Medicare Other | Admitting: Family Medicine

## 2023-08-17 ENCOUNTER — Encounter: Payer: Self-pay | Admitting: Family Medicine

## 2023-08-17 VITALS — BP 134/76 | HR 92 | Ht 67.0 in | Wt 212.0 lb

## 2023-08-17 DIAGNOSIS — R591 Generalized enlarged lymph nodes: Secondary | ICD-10-CM

## 2023-08-17 DIAGNOSIS — R7303 Prediabetes: Secondary | ICD-10-CM | POA: Diagnosis not present

## 2023-08-19 DIAGNOSIS — R591 Generalized enlarged lymph nodes: Secondary | ICD-10-CM | POA: Insufficient documentation

## 2023-08-19 NOTE — Progress Notes (Signed)
Rhonda Munoz - 68 y.o. female MRN [id number]  Date of birth: 06-12-1956  Subjective Chief Complaint  Patient presents with   Mass    HPI Rhonda Munoz is a 67 y.o. female here today with complaint of swollen lymph nodes.  Noticed stiffness in the right side of her neck a couple of days ago after waking up.  She felt a couple of nodules on her neck and right scalp area.  She did have an illness a few weeks ago which has resolved at this point.  She did have her Prevnar immunization a few days ago.  She has not had fever or chills.  She did have labs completed recently with A1c in diabetes range at 7.4%.  Previously in the prediabetes range.  She would prefer to work on diet lifestyle before adding medication to help with this.  ROS:  A comprehensive ROS was completed and negative except as noted per HPI  Allergies  Allergen Reactions   Clenpiq [Sod Picosulfate-Mag Ox-Cit Acd]     Sweating, vomiting and bad headache   Codeine     Nausea, Rash   Effexor [Venlafaxine]     VISUAL HALLUCINATIONS   Hydromorphone     Tachycardia, rash   Procaine     Nausea, Rash    Past Medical History:  Diagnosis Date   Allergy    Cataract    Depression    GERD (gastroesophageal reflux disease)    Hemorrhoids    Hyperlipidemia     Past Surgical History:  Procedure Laterality Date   ABDOMINAL HYSTERECTOMY     CATARACT EXTRACTION, BILATERAL Bilateral 08/2022   second one 09/2022   CHOLECYSTECTOMY     EYE SURGERY  2024   Cataract   FINGER SURGERY Right    index finger x 1, middle finger x 2   GALLBLADDER SURGERY     TONSILLECTOMY     TUBAL LIGATION      Social History   Socioeconomic History   Marital status: Widowed    Spouse name: Not on file   Number of children: 2   Years of education: 14   Highest education level: Associate degree: academic program  Occupational History   Occupation: Retired  Tobacco Use   Smoking status: Never   Smokeless tobacco: Never  Vaping Use    Vaping status: Never Used  Substance and Sexual Activity   Alcohol use: Never   Drug use: Never   Sexual activity: Not Currently    Partners: Male    Birth control/protection: Post-menopausal, Surgical  Other Topics Concern   Not on file  Social History Narrative   Lives with her dog. She has two sons. She enjoys reading, social gatherings at church and spending time with her grandchildren. Her sons live in [place].   Social Drivers of Corporate investment banker Strain: Low Risk  (07/11/2023)   Overall Financial Resource Strain (CARDIA)    Difficulty of Paying Living Expenses: Not hard at all  Food Insecurity: No Food Insecurity (07/11/2023)   Hunger Vital Sign    Worried About Running Out of Food in the Last Year: Never true    Ran Out of Food in the Last Year: Never true  Transportation Needs: No Transportation Needs (07/11/2023)   PRAPARE - Administrator, Civil Service (Medical): No    Lack of Transportation (Non-Medical): No  Physical Activity: Insufficiently Active (07/11/2023)   Exercise Vital Sign    Days of Exercise per Week:  4 days    Minutes of Exercise per Session: 20 min  Stress: No Stress Concern Present (07/11/2023)   [employer] of Occupational Health - Occupational Stress Questionnaire    Feeling of Stress : Not at all  Social Connections: Moderately Integrated (07/11/2023)   Social Connection and Isolation Panel [NHANES]    Frequency of Communication with Friends and Family: More than three times a week    Frequency of Social Gatherings with Friends and Family: More than three times a week    Attends Religious Services: More than 4 times per year    Active Member of [employer] or Organizations: Yes    Attends Banker Meetings: More than 4 times per year    Marital Status: Widowed    Family History  Problem Relation Age of Onset   Breast cancer Mother    Hypertension Mother    Stroke Mother    Colon polyps Mother     Arthritis Mother    Asthma Mother    Cancer Mother    Hearing loss Mother    Miscarriages / [place] Mother    Obesity Mother    Diabetes Father    Heart attack Father    Heart disease Father    Hypertension Father    Hyperlipidemia Father    Arthritis Father    Obesity Father    Varicose Veins Father    Cancer Sister    Colon cancer Sister    Heart failure Maternal Grandmother    Colon cancer Maternal Grandfather    Heart failure Paternal Grandmother    Cancer Sister    Esophageal cancer Neg Hx    Rectal cancer Neg Hx    Stomach cancer Neg Hx     Health Maintenance  Topic Date Due   COVID-19 Vaccine (4 - 2024-25 season) 04/26/2023   Hepatitis C Screening  05/31/2024 (Originally 07/07/1976)   HIV Screening  05/31/2024 (Originally 07/07/1973)   Medicare Annual Wellness (AWV)  05/31/2024   DTaP/Tdap/Td (3 - Td or Tdap) 01/12/2025   MAMMOGRAM  05/19/2025   DEXA SCAN  04/21/2026   Colonoscopy  05/21/2028   Pneumonia Vaccine 53+ Years old  Completed   INFLUENZA VACCINE  Completed   Zoster Vaccines- Shingrix  Completed   HPV VACCINES  Aged Out     ----------------------------------------------------------------------------------------------------------------------------------------------------------------------------------------------------------------- Physical Exam BP 134/76 (BP Location: Left Arm, Patient Position: Sitting, Cuff Size: Large)   Pulse 92   Ht 5\' 7"  (1.702 m)   Wt 212 lb (96.2 kg)   SpO2 100%   BMI 33.20 kg/m   Physical Exam Constitutional:      Appearance: Normal appearance.  HENT:     Head: Normocephalic and atraumatic.  Neck:     Comments: Posterior cervical lymphadenopathy.  Just mildly tender. Neurological:     Mental Status: She is alert.      ------------------------------------------------------------------------------------------------------------------------------------------------------------------------------------------------------------------- Assessment and Plan  Prediabetes Recent A1c consistent with diabetes.  We discussed 40-month trial of diet and exercise to see if she can get her blood sugars back under better control.  She is aware that if not making significant improvements at 78-month interval we will have to consider adding medication.  Lymphadenopathy She has to slightly tender lymph nodes along the posterior cervical chain and scalp on the right side.  This is likely reactive from her recent pneumonia vaccine.  We discussed red flags and precautions including signs of illness, fever or chills.   No orders of the defined types were placed in  this encounter.   Return in about 3 months (around 11/15/2023) for Type 2 Diabetes.    This visit occurred during the SARS-CoV-2 public health emergency.  Safety protocols were in place, including screening questions prior to the visit, additional usage of staff PPE, and extensive cleaning of exam room while observing appropriate contact time as indicated for disinfecting solutions.

## 2023-08-19 NOTE — Assessment & Plan Note (Signed)
She has to slightly tender lymph nodes along the posterior cervical chain and scalp on the right side.  This is likely reactive from her recent pneumonia vaccine.  We discussed red flags and precautions including signs of illness, fever or chills.

## 2023-08-19 NOTE — Assessment & Plan Note (Signed)
Recent A1c consistent with diabetes.  We discussed 31-month trial of diet and exercise to see if she can get her blood sugars back under better control.  She is aware that if not making significant improvements at 48-month interval we will have to consider adding medication.

## 2023-09-02 ENCOUNTER — Encounter: Payer: Self-pay | Admitting: Family Medicine

## 2023-09-02 MED ORDER — DESVENLAFAXINE SUCCINATE ER 25 MG PO TB24: 25.0000 mg | ORAL_TABLET | Freq: Every day | ORAL | 1 refills | Status: DC

## 2023-09-10 ENCOUNTER — Encounter: Payer: Self-pay | Admitting: Family Medicine

## 2023-09-10 MED ORDER — DESVENLAFAXINE SUCCINATE ER 25 MG PO TB24: 25.0000 mg | ORAL_TABLET | Freq: Every day | ORAL | 0 refills | Status: DC

## 2023-09-11 ENCOUNTER — Other Ambulatory Visit: Payer: Self-pay | Admitting: Family Medicine

## 2023-10-14 ENCOUNTER — Encounter: Payer: Self-pay | Admitting: Family Medicine

## 2023-10-15 MED ORDER — ACYCLOVIR 400 MG PO TABS: 400.0000 mg | ORAL_TABLET | Freq: Three times a day (TID) | ORAL | 1 refills | Status: AC

## 2023-11-16 ENCOUNTER — Ambulatory Visit (INDEPENDENT_AMBULATORY_CARE_PROVIDER_SITE_OTHER): Payer: Medicare Other | Admitting: Family Medicine

## 2023-11-16 ENCOUNTER — Encounter: Payer: Self-pay | Admitting: Family Medicine

## 2023-11-16 VITALS — BP 120/71 | HR 84 | Ht 67.0 in | Wt 193.5 lb

## 2023-11-16 DIAGNOSIS — R7303 Prediabetes: Secondary | ICD-10-CM

## 2023-11-16 DIAGNOSIS — E7801 Familial hypercholesterolemia: Secondary | ICD-10-CM

## 2023-11-16 DIAGNOSIS — F324 Major depressive disorder, single episode, in partial remission: Secondary | ICD-10-CM

## 2023-11-16 LAB — POCT GLYCOSYLATED HEMOGLOBIN (HGB A1C): HbA1c, POC (prediabetic range): 5.8 % (ref 5.7–6.4)

## 2023-11-16 NOTE — Assessment & Plan Note (Signed)
 She has done great with making dietary and lifestyle changes.  Her A1c has improved to 5.8%.  Encouraged to continue current changes.

## 2023-11-16 NOTE — Assessment & Plan Note (Signed)
 She remains on Pristiq.  Will plan to continue.

## 2023-11-16 NOTE — Assessment & Plan Note (Signed)
Doing well with pravastatin.  Recommend continuation.

## 2023-11-16 NOTE — Progress Notes (Signed)
 Rhonda Munoz - 68 y.o. female MRN [id number]  Date of birth: 04-16-1956  Subjective Chief Complaint  Patient presents with   Medical Management of Chronic Issues    HPI Rhonda Munoz is a 68 y.o. female here today for follow up visit.   She has made significant changes to her diet and activity level since her last visit.  Her A1c is down to 5.8% and her weight has decreased by about 20lbs.  Reports that overall she feels good.    She continues on pristiq.  She denies significant side effects with this at current strength.   ROS:  A comprehensive ROS was completed and negative except as noted per HPI  Allergies  Allergen Reactions   Clenpiq [Sod Picosulfate-Mag Ox-Cit Acd]     Sweating, vomiting and bad headache   Codeine     Nausea, Rash   Effexor [Venlafaxine]     VISUAL HALLUCINATIONS   Hydromorphone     Tachycardia, rash   Procaine     Nausea, Rash    Past Medical History:  Diagnosis Date   Allergy    Cataract    Depression    GERD (gastroesophageal reflux disease)    Hemorrhoids    Hyperlipidemia     Past Surgical History:  Procedure Laterality Date   ABDOMINAL HYSTERECTOMY     CATARACT EXTRACTION, BILATERAL Bilateral 08/2022   second one 09/2022   CHOLECYSTECTOMY     EYE SURGERY  2024   Cataract   FINGER SURGERY Right    index finger x 1, middle finger x 2   GALLBLADDER SURGERY     TONSILLECTOMY     TUBAL LIGATION      Social History   Socioeconomic History   Marital status: Widowed    Spouse name: Not on file   Number of children: 2   Years of education: 14   Highest education level: Associate degree: academic program  Occupational History   Occupation: Retired  Tobacco Use   Smoking status: Never   Smokeless tobacco: Never  Vaping Use   Vaping status: Never Used  Substance and Sexual Activity   Alcohol use: Never   Drug use: Never   Sexual activity: Not Currently    Partners: Male    Birth control/protection: Post-menopausal,  Surgical  Other Topics Concern   Not on file  Social History Narrative   Lives with her dog. She has two sons. She enjoys reading, social gatherings at church and spending time with her grandchildren. Her sons live in [place].   Social Drivers of Corporate investment banker Strain: Low Risk  (11/12/2023)   Overall Financial Resource Strain (CARDIA)    Difficulty of Paying Living Expenses: Not hard at all  Food Insecurity: No Food Insecurity (11/12/2023)   Hunger Vital Sign    Worried About Running Out of Food in the Last Year: Never true    Ran Out of Food in the Last Year: Never true  Transportation Needs: No Transportation Needs (11/12/2023)   PRAPARE - Administrator, Civil Service (Medical): No    Lack of Transportation (Non-Medical): No  Physical Activity: Sufficiently Active (11/12/2023)   Exercise Vital Sign    Days of Exercise per Week: 5 days    Minutes of Exercise per Session: 30 min  Stress: No Stress Concern Present (11/12/2023)   [employer] of Occupational Health - Occupational Stress Questionnaire    Feeling of Stress : Not at all  Social Connections:  Moderately Integrated (11/12/2023)   Social Connection and Isolation Panel [NHANES]    Frequency of Communication with Friends and Family: More than three times a week    Frequency of Social Gatherings with Friends and Family: More than three times a week    Attends Religious Services: More than 4 times per year    Active Member of [employer] or Organizations: Yes    Attends Banker Meetings: More than 4 times per year    Marital Status: Widowed    Family History  Problem Relation Age of Onset   Breast cancer Mother    Hypertension Mother    Stroke Mother    Colon polyps Mother    Arthritis Mother    Asthma Mother    Cancer Mother    Hearing loss Mother    Miscarriages / [place] Mother    Obesity Mother    Diabetes Father    Heart attack Father    Heart disease Father     Hypertension Father    Hyperlipidemia Father    Arthritis Father    Obesity Father    Varicose Veins Father    Cancer Sister    Colon cancer Sister    Heart failure Maternal Grandmother    Colon cancer Maternal Grandfather    Heart failure Paternal Grandmother    Cancer Sister    Esophageal cancer Neg Hx    Rectal cancer Neg Hx    Stomach cancer Neg Hx     Health Maintenance  Topic Date Due   COVID-19 Vaccine (4 - 2024-25 season) 05/03/2024 (Originally 04/26/2023)   Hepatitis C Screening  05/31/2024 (Originally 07/07/1976)   HIV Screening  05/31/2024 (Originally 07/07/1973)   Medicare Annual Wellness (AWV)  05/31/2024   DTaP/Tdap/Td (3 - Td or Tdap) 01/12/2025   MAMMOGRAM  05/19/2025   DEXA SCAN  04/21/2026   Colonoscopy  05/21/2028   Pneumonia Vaccine 65+ Years old  Completed   INFLUENZA VACCINE  Completed   Zoster Vaccines- Shingrix  Completed   HPV VACCINES  Aged Out     ----------------------------------------------------------------------------------------------------------------------------------------------------------------------------------------------------------------- Physical Exam BP 120/71 (BP Location: Left Arm, Patient Position: Sitting, Cuff Size: Normal)   Pulse 84   Ht 5\' 7"  (1.702 m)   Wt 193 lb 8 oz (87.8 kg)   SpO2 99%   BMI 30.31 kg/m   Physical Exam Constitutional:      Appearance: Normal appearance.  HENT:     Head: Normocephalic and atraumatic.  Cardiovascular:     Rate and Rhythm: Normal rate and regular rhythm.  Pulmonary:     Effort: Pulmonary effort is normal.     Breath sounds: Normal breath sounds.  Neurological:     General: No focal deficit present.     Mental Status: She is alert.  Psychiatric:        Mood and Affect: Mood normal.        Behavior: Behavior normal.      ------------------------------------------------------------------------------------------------------------------------------------------------------------------------------------------------------------------- Assessment and Plan  Prediabetes She has done great with making dietary and lifestyle changes.  Her A1c has improved to 5.8%.  Encouraged to continue current changes.    Depression She remains on Pristiq.  Will plan to continue.    Hyperlipemia Doing well with pravastatin.  Recommend continuation.   No orders of the defined types were placed in this encounter.   No follow-ups on file.    This visit occurred during the SARS-CoV-2 public health emergency.  Safety protocols were in place, including screening questions  prior to the visit, additional usage of staff PPE, and extensive cleaning of exam room while observing appropriate contact time as indicated for disinfecting solutions.

## 2024-01-19 ENCOUNTER — Other Ambulatory Visit: Payer: Self-pay | Admitting: Family Medicine

## 2024-02-10 ENCOUNTER — Emergency Department (HOSPITAL_BASED_OUTPATIENT_CLINIC_OR_DEPARTMENT_OTHER)

## 2024-02-10 ENCOUNTER — Emergency Department (HOSPITAL_COMMUNITY): Admitting: Certified Registered Nurse Anesthetist

## 2024-02-10 ENCOUNTER — Ambulatory Visit: Admit: 2024-02-10 | Admitting: Surgery

## 2024-02-10 ENCOUNTER — Encounter (HOSPITAL_BASED_OUTPATIENT_CLINIC_OR_DEPARTMENT_OTHER): Payer: Self-pay

## 2024-02-10 ENCOUNTER — Encounter (HOSPITAL_COMMUNITY): Admission: EM | Disposition: A | Discharge status: Home / Self Care | Payer: Self-pay | Source: Home / Self Care

## 2024-02-10 ENCOUNTER — Other Ambulatory Visit: Payer: Self-pay

## 2024-02-10 ENCOUNTER — Inpatient Hospital Stay (HOSPITAL_BASED_OUTPATIENT_CLINIC_OR_DEPARTMENT_OTHER)
Admission: EM | Admit: 2024-02-10 | Discharge: 2024-02-12 | DRG: 399 | Disposition: A | Discharge status: Home / Self Care | Attending: Surgery | Admitting: Surgery

## 2024-02-10 DIAGNOSIS — K219 Gastro-esophageal reflux disease without esophagitis: Secondary | ICD-10-CM | POA: Diagnosis present

## 2024-02-10 DIAGNOSIS — E785 Hyperlipidemia, unspecified: Secondary | ICD-10-CM | POA: Diagnosis present

## 2024-02-10 DIAGNOSIS — Z884 Allergy status to anesthetic agent status: Secondary | ICD-10-CM

## 2024-02-10 DIAGNOSIS — Z7401 Bed confinement status: Secondary | ICD-10-CM | POA: Diagnosis not present

## 2024-02-10 DIAGNOSIS — F32A Depression, unspecified: Secondary | ICD-10-CM | POA: Diagnosis present

## 2024-02-10 DIAGNOSIS — Z8261 Family history of arthritis: Secondary | ICD-10-CM

## 2024-02-10 DIAGNOSIS — Z8249 Family history of ischemic heart disease and other diseases of the circulatory system: Secondary | ICD-10-CM

## 2024-02-10 DIAGNOSIS — K353 Acute appendicitis with localized peritonitis, without perforation or gangrene: Principal | ICD-10-CM

## 2024-02-10 DIAGNOSIS — Z833 Family history of diabetes mellitus: Secondary | ICD-10-CM

## 2024-02-10 DIAGNOSIS — Z9889 Other specified postprocedural states: Secondary | ICD-10-CM

## 2024-02-10 DIAGNOSIS — Z809 Family history of malignant neoplasm, unspecified: Secondary | ICD-10-CM

## 2024-02-10 DIAGNOSIS — Z83438 Family history of other disorder of lipoprotein metabolism and other lipidemia: Secondary | ICD-10-CM

## 2024-02-10 DIAGNOSIS — K3580 Unspecified acute appendicitis: Secondary | ICD-10-CM | POA: Diagnosis not present

## 2024-02-10 DIAGNOSIS — Z803 Family history of malignant neoplasm of breast: Secondary | ICD-10-CM

## 2024-02-10 DIAGNOSIS — Z83719 Family history of colon polyps, unspecified: Secondary | ICD-10-CM

## 2024-02-10 DIAGNOSIS — Z825 Family history of asthma and other chronic lower respiratory diseases: Secondary | ICD-10-CM

## 2024-02-10 DIAGNOSIS — Z9842 Cataract extraction status, left eye: Secondary | ICD-10-CM

## 2024-02-10 DIAGNOSIS — Z888 Allergy status to other drugs, medicaments and biological substances status: Secondary | ICD-10-CM

## 2024-02-10 DIAGNOSIS — K3532 Acute appendicitis with perforation and localized peritonitis, without abscess: Principal | ICD-10-CM | POA: Diagnosis present

## 2024-02-10 DIAGNOSIS — Z9049 Acquired absence of other specified parts of digestive tract: Secondary | ICD-10-CM

## 2024-02-10 DIAGNOSIS — Z9089 Acquired absence of other organs: Secondary | ICD-10-CM

## 2024-02-10 DIAGNOSIS — Z90710 Acquired absence of both cervix and uterus: Secondary | ICD-10-CM

## 2024-02-10 DIAGNOSIS — Z885 Allergy status to narcotic agent status: Secondary | ICD-10-CM

## 2024-02-10 DIAGNOSIS — R1084 Generalized abdominal pain: Secondary | ICD-10-CM | POA: Diagnosis not present

## 2024-02-10 DIAGNOSIS — Z822 Family history of deafness and hearing loss: Secondary | ICD-10-CM

## 2024-02-10 DIAGNOSIS — Z823 Family history of stroke: Secondary | ICD-10-CM

## 2024-02-10 DIAGNOSIS — Z800 Family history of malignant neoplasm of digestive organs: Secondary | ICD-10-CM

## 2024-02-10 DIAGNOSIS — K381 Appendicular concretions: Secondary | ICD-10-CM | POA: Diagnosis present

## 2024-02-10 DIAGNOSIS — Z79899 Other long term (current) drug therapy: Secondary | ICD-10-CM

## 2024-02-10 DIAGNOSIS — Z9841 Cataract extraction status, right eye: Secondary | ICD-10-CM

## 2024-02-10 DIAGNOSIS — Z9851 Tubal ligation status: Secondary | ICD-10-CM

## 2024-02-10 DIAGNOSIS — Z7982 Long term (current) use of aspirin: Secondary | ICD-10-CM

## 2024-02-10 HISTORY — PX: LAPAROSCOPIC APPENDECTOMY: SHX408

## 2024-02-10 LAB — URINALYSIS, ROUTINE W REFLEX MICROSCOPIC
Bilirubin Urine: NEGATIVE
Glucose, UA: NEGATIVE mg/dL
Ketones, ur: NEGATIVE mg/dL
Leukocytes,Ua: NEGATIVE
Nitrite: NEGATIVE
Protein, ur: NEGATIVE mg/dL
Specific Gravity, Urine: 1.02 (ref 1.005–1.030)
pH: 6 (ref 5.0–8.0)

## 2024-02-10 LAB — URINALYSIS, MICROSCOPIC (REFLEX)

## 2024-02-10 LAB — CBC
HCT: 42.6 % (ref 36.0–46.0)
HCT: 40.7 % (ref 36.0–46.0)
Hemoglobin: 13.9 g/dL (ref 12.0–15.0)
Hemoglobin: 12.5 g/dL (ref 12.0–15.0)
MCH: 28.4 pg (ref 26.0–34.0)
MCH: 27.5 pg (ref 26.0–34.0)
MCHC: 32.6 g/dL (ref 30.0–36.0)
MCHC: 30.7 g/dL (ref 30.0–36.0)
MCV: 87.1 fL (ref 80.0–100.0)
MCV: 89.5 fL (ref 80.0–100.0)
Platelets: 287 10*3/uL (ref 150–400)
Platelets: 212 10*3/uL (ref 150–400)
RBC: 4.89 MIL/uL (ref 3.87–5.11)
RBC: 4.55 MIL/uL (ref 3.87–5.11)
RDW: 14.2 % (ref 11.5–15.5)
RDW: 14.3 % (ref 11.5–15.5)
WBC: 18.7 10*3/uL — ABNORMAL HIGH (ref 4.0–10.5)
WBC: 13.5 10*3/uL — ABNORMAL HIGH (ref 4.0–10.5)
nRBC: 0 % (ref 0.0–0.2)
nRBC: 0 % (ref 0.0–0.2)

## 2024-02-10 LAB — COMPREHENSIVE METABOLIC PANEL WITH GFR
ALT: 15 U/L (ref 0–44)
AST: 21 U/L (ref 15–41)
Albumin: 4.8 g/dL (ref 3.5–5.0)
Alkaline Phosphatase: 110 U/L (ref 38–126)
Anion gap: 15 (ref 5–15)
BUN: 10 mg/dL (ref 8–23)
CO2: 25 mmol/L (ref 22–32)
Calcium: 9.8 mg/dL (ref 8.9–10.3)
Chloride: 98 mmol/L (ref 98–111)
Creatinine, Ser: 0.76 mg/dL (ref 0.44–1.00)
GFR, Estimated: >60 mL/min (ref >60)
Glucose, Bld: 156 mg/dL — ABNORMAL HIGH (ref 70–99)
Potassium: 3.9 mmol/L (ref 3.5–5.1)
Sodium: 138 mmol/L (ref 135–145)
Total Bilirubin: 0.4 mg/dL (ref 0.0–1.2)
Total Protein: 8.3 g/dL — ABNORMAL HIGH (ref 6.5–8.1)

## 2024-02-10 LAB — LIPASE, BLOOD: Lipase: 57 U/L — ABNORMAL HIGH (ref 11–51)

## 2024-02-10 LAB — CREATININE, SERUM
Creatinine, Ser: 0.69 mg/dL (ref 0.44–1.00)
GFR, Estimated: >60 mL/min (ref >60)

## 2024-02-10 SURGERY — APPENDECTOMY, LAPAROSCOPIC
Anesthesia: General

## 2024-02-10 MED ORDER — ORAL CARE MOUTH RINSE: 15.0000 mL | Freq: Once | OROMUCOSAL | Status: AC

## 2024-02-10 MED ORDER — BUPIVACAINE-EPINEPHRINE 0.25% -1:200000 IJ SOLN
INTRAMUSCULAR | Status: DC | PRN
  Administered 2024-02-10: 30 mL

## 2024-02-10 MED ORDER — FENTANYL CITRATE PF 50 MCG/ML IJ SOSY
50.0000 ug | PREFILLED_SYRINGE | Freq: Once | INTRAMUSCULAR | Status: AC
  Administered 2024-02-10: 50 ug via INTRAVENOUS
  Filled 2024-02-10: qty 1

## 2024-02-10 MED ORDER — MIDAZOLAM HCL 5 MG/5ML IJ SOLN
INTRAMUSCULAR | Status: DC | PRN
Start: 2024-02-10 — End: 2024-02-10
  Administered 2024-02-10: 2 mg via INTRAVENOUS

## 2024-02-10 MED ORDER — 0.9 % SODIUM CHLORIDE (POUR BTL) OPTIME
TOPICAL | Status: DC | PRN
  Administered 2024-02-10: 1000 mL

## 2024-02-10 MED ORDER — SUCCINYLCHOLINE CHLORIDE 200 MG/10ML IV SOSY
PREFILLED_SYRINGE | INTRAVENOUS | Status: DC | PRN
Start: 2024-02-10 — End: 2024-02-10
  Administered 2024-02-10: 100 mg via INTRAVENOUS

## 2024-02-10 MED ORDER — SUGAMMADEX SODIUM 200 MG/2ML IV SOLN
INTRAVENOUS | Status: DC | PRN
  Administered 2024-02-10: 200 mg via INTRAVENOUS

## 2024-02-10 MED ORDER — FENTANYL CITRATE PF 50 MCG/ML IJ SOSY
PREFILLED_SYRINGE | INTRAMUSCULAR | Status: AC
  Filled 2024-02-10: qty 2

## 2024-02-10 MED ORDER — ONDANSETRON HCL 4 MG/2ML IJ SOLN
4.0000 mg | Freq: Once | INTRAMUSCULAR | Status: AC
  Administered 2024-02-10: 4 mg via INTRAVENOUS
  Filled 2024-02-10: qty 2

## 2024-02-10 MED ORDER — ROCURONIUM BROMIDE 10 MG/ML (PF) SYRINGE
PREFILLED_SYRINGE | INTRAVENOUS | Status: DC | PRN
Start: 2024-02-10 — End: 2024-02-10
  Administered 2024-02-10: 30 mg via INTRAVENOUS

## 2024-02-10 MED ORDER — FENTANYL CITRATE PF 50 MCG/ML IJ SOSY
25.0000 ug | PREFILLED_SYRINGE | INTRAMUSCULAR | Status: DC | PRN
  Administered 2024-02-10: 25 ug via INTRAVENOUS
  Filled 2024-02-10: qty 1

## 2024-02-10 MED ORDER — FENTANYL CITRATE (PF) 100 MCG/2ML IJ SOLN
INTRAMUSCULAR | Status: DC | PRN
  Administered 2024-02-10 (×2): 50 ug via INTRAVENOUS

## 2024-02-10 MED ORDER — PHENYLEPHRINE 80 MCG/ML (10ML) SYRINGE FOR IV PUSH (FOR BLOOD PRESSURE SUPPORT)
PREFILLED_SYRINGE | INTRAVENOUS | Status: AC
  Filled 2024-02-10: qty 10

## 2024-02-10 MED ORDER — PRAVASTATIN SODIUM 20 MG PO TABS
20.0000 mg | ORAL_TABLET | Freq: Every evening | ORAL | Status: DC
  Administered 2024-02-10 – 2024-02-11 (×2): 20 mg via ORAL
  Filled 2024-02-10 (×2): qty 1

## 2024-02-10 MED ORDER — ONDANSETRON HCL 4 MG/2ML IJ SOLN
INTRAMUSCULAR | Status: DC | PRN
Start: 2024-02-10 — End: 2024-02-10
  Administered 2024-02-10: 4 mg via INTRAVENOUS

## 2024-02-10 MED ORDER — PANTOPRAZOLE SODIUM 40 MG PO TBEC
40.0000 mg | DELAYED_RELEASE_TABLET | Freq: Every day | ORAL | Status: DC
  Administered 2024-02-10 – 2024-02-11 (×2): 40 mg via ORAL
  Filled 2024-02-10 (×2): qty 1

## 2024-02-10 MED ORDER — ONDANSETRON HCL 4 MG/2ML IJ SOLN
INTRAMUSCULAR | Status: AC
  Administered 2024-02-10: 4 mg via INTRAVENOUS
  Filled 2024-02-10: qty 2

## 2024-02-10 MED ORDER — METRONIDAZOLE 500 MG/100ML IV SOLN
500.0000 mg | Freq: Two times a day (BID) | INTRAVENOUS | Status: DC
  Administered 2024-02-10 – 2024-02-11 (×3): 500 mg via INTRAVENOUS
  Filled 2024-02-10 (×3): qty 100

## 2024-02-10 MED ORDER — SIMETHICONE 80 MG PO CHEW
40.0000 mg | CHEWABLE_TABLET | Freq: Four times a day (QID) | ORAL | Status: DC | PRN
Start: 2024-02-10 — End: 2024-02-12

## 2024-02-10 MED ORDER — MORPHINE SULFATE (PF) 4 MG/ML IV SOLN
4.0000 mg | Freq: Once | INTRAVENOUS | Status: AC
  Administered 2024-02-10: 4 mg via INTRAVENOUS
  Filled 2024-02-10: qty 1

## 2024-02-10 MED ORDER — SODIUM CHLORIDE 0.9 % IV SOLN
12.5000 mg | Freq: Once | INTRAVENOUS | Status: AC
  Administered 2024-02-10: 12.5 mg via INTRAVENOUS
  Filled 2024-02-10: qty 0.5

## 2024-02-10 MED ORDER — PROMETHAZINE HCL 25 MG/ML IJ SOLN
INTRAMUSCULAR | Status: AC
  Filled 2024-02-10: qty 1

## 2024-02-10 MED ORDER — ACETAMINOPHEN 500 MG PO TABS
1000.0000 mg | ORAL_TABLET | Freq: Four times a day (QID) | ORAL | Status: DC
  Administered 2024-02-10 – 2024-02-12 (×7): 1000 mg via ORAL
  Filled 2024-02-10 (×7): qty 2

## 2024-02-10 MED ORDER — METOPROLOL TARTRATE 5 MG/5ML IV SOLN: 5.0000 mg | Freq: Four times a day (QID) | INTRAVENOUS | Status: DC | PRN

## 2024-02-10 MED ORDER — CEFTRIAXONE SODIUM 2 G IJ SOLR
2.0000 g | Freq: Once | INTRAMUSCULAR | Status: AC
  Administered 2024-02-10: 2 g via INTRAVENOUS
  Filled 2024-02-10: qty 20

## 2024-02-10 MED ORDER — MIDAZOLAM HCL 2 MG/2ML IJ SOLN
INTRAMUSCULAR | Status: AC
Start: 2024-02-10 — End: 2024-02-10
  Filled 2024-02-10: qty 2

## 2024-02-10 MED ORDER — PROPOFOL 10 MG/ML IV BOLUS
INTRAVENOUS | Status: AC
  Filled 2024-02-10: qty 20

## 2024-02-10 MED ORDER — LIDOCAINE HCL (PF) 2 % IJ SOLN
INTRAMUSCULAR | Status: DC | PRN
  Administered 2024-02-10: 100 mg via INTRADERMAL

## 2024-02-10 MED ORDER — DIPHENHYDRAMINE HCL 50 MG/ML IJ SOLN: 12.5000 mg | Freq: Four times a day (QID) | INTRAMUSCULAR | Status: DC | PRN

## 2024-02-10 MED ORDER — METRONIDAZOLE 500 MG/100ML IV SOLN
500.0000 mg | Freq: Once | INTRAVENOUS | Status: AC
  Administered 2024-02-10: 500 mg via INTRAVENOUS
  Filled 2024-02-10: qty 100

## 2024-02-10 MED ORDER — PHENYLEPHRINE 80 MCG/ML (10ML) SYRINGE FOR IV PUSH (FOR BLOOD PRESSURE SUPPORT)
PREFILLED_SYRINGE | INTRAVENOUS | Status: DC | PRN
  Administered 2024-02-10: 160 ug via INTRAVENOUS
  Administered 2024-02-10: 240 ug via INTRAVENOUS

## 2024-02-10 MED ORDER — ONDANSETRON HCL 4 MG/2ML IJ SOLN
4.0000 mg | Freq: Four times a day (QID) | INTRAMUSCULAR | Status: DC | PRN
Start: 2024-02-10 — End: 2024-02-12

## 2024-02-10 MED ORDER — AMISULPRIDE (ANTIEMETIC) 5 MG/2ML IV SOLN: 10.0000 mg | Freq: Once | INTRAVENOUS | Status: DC | PRN

## 2024-02-10 MED ORDER — BUPIVACAINE-EPINEPHRINE (PF) 0.25% -1:200000 IJ SOLN
INTRAMUSCULAR | Status: AC
  Filled 2024-02-10: qty 30

## 2024-02-10 MED ORDER — PROPOFOL 10 MG/ML IV BOLUS
INTRAVENOUS | Status: DC | PRN
  Administered 2024-02-10: 130 mg via INTRAVENOUS

## 2024-02-10 MED ORDER — DEXAMETHASONE SODIUM PHOSPHATE 10 MG/ML IJ SOLN
INTRAMUSCULAR | Status: DC | PRN
  Administered 2024-02-10: 10 mg via INTRAVENOUS

## 2024-02-10 MED ORDER — FENTANYL CITRATE PF 50 MCG/ML IJ SOSY
25.0000 ug | PREFILLED_SYRINGE | INTRAMUSCULAR | Status: DC | PRN
  Administered 2024-02-10: 50 ug via INTRAVENOUS

## 2024-02-10 MED ORDER — OXYCODONE HCL 5 MG/5ML PO SOLN: 5.0000 mg | Freq: Once | ORAL | Status: DC | PRN

## 2024-02-10 MED ORDER — SODIUM CHLORIDE 0.9 % IV SOLN: Freq: Once | INTRAVENOUS | Status: AC

## 2024-02-10 MED ORDER — IOHEXOL 300 MG/ML  SOLN
100.0000 mL | Freq: Once | INTRAMUSCULAR | Status: AC | PRN
  Administered 2024-02-10: 100 mL via INTRAVENOUS

## 2024-02-10 MED ORDER — LACTATED RINGERS IV SOLN: INTRAVENOUS | Status: DC

## 2024-02-10 MED ORDER — FENTANYL CITRATE (PF) 100 MCG/2ML IJ SOLN
INTRAMUSCULAR | Status: AC
  Filled 2024-02-10: qty 2

## 2024-02-10 MED ORDER — ONDANSETRON HCL 4 MG/2ML IJ SOLN: 4.0000 mg | Freq: Once | INTRAMUSCULAR | Status: AC

## 2024-02-10 MED ORDER — OXYCODONE HCL 5 MG PO TABS: 5.0000 mg | ORAL_TABLET | Freq: Once | ORAL | Status: DC | PRN

## 2024-02-10 MED ORDER — CHLORHEXIDINE GLUCONATE 0.12 % MT SOLN
15.0000 mL | Freq: Once | OROMUCOSAL | Status: AC
  Administered 2024-02-10: 15 mL via OROMUCOSAL

## 2024-02-10 MED ORDER — ONDANSETRON 4 MG PO TBDP
4.0000 mg | ORAL_TABLET | Freq: Four times a day (QID) | ORAL | Status: DC | PRN
Start: 2024-02-10 — End: 2024-02-12
  Administered 2024-02-11: 4 mg via ORAL
  Filled 2024-02-10: qty 1

## 2024-02-10 MED ORDER — STERILE WATER FOR IRRIGATION IR SOLN
Status: DC | PRN
  Administered 2024-02-10: 1000 mL

## 2024-02-10 MED ORDER — ASPIRIN 81 MG PO TBEC
81.0000 mg | DELAYED_RELEASE_TABLET | Freq: Every day | ORAL | Status: DC
  Administered 2024-02-10 – 2024-02-12 (×3): 81 mg via ORAL
  Filled 2024-02-10 (×3): qty 1

## 2024-02-10 MED ORDER — SODIUM CHLORIDE 0.9 % IV BOLUS
1000.0000 mL | Freq: Once | INTRAVENOUS | Status: AC
  Administered 2024-02-10: 1000 mL via INTRAVENOUS

## 2024-02-10 MED ORDER — MORPHINE SULFATE (PF) 4 MG/ML IV SOLN
4.0000 mg | INTRAVENOUS | Status: DC | PRN
  Administered 2024-02-10: 4 mg via INTRAVENOUS
  Filled 2024-02-10: qty 1

## 2024-02-10 MED ORDER — OXYCODONE HCL 5 MG PO TABS
5.0000 mg | ORAL_TABLET | ORAL | Status: DC | PRN
  Administered 2024-02-10: 5 mg via ORAL
  Administered 2024-02-10 – 2024-02-11 (×2): 10 mg via ORAL
  Filled 2024-02-10 (×4): qty 2

## 2024-02-10 MED ORDER — DIPHENHYDRAMINE HCL 12.5 MG/5ML PO ELIX: 12.5000 mg | ORAL_SOLUTION | Freq: Four times a day (QID) | ORAL | Status: DC | PRN

## 2024-02-10 MED ORDER — KCL IN DEXTROSE-NACL 20-5-0.45 MEQ/L-%-% IV SOLN
INTRAVENOUS | Status: AC
  Filled 2024-02-10 (×3): qty 1000

## 2024-02-10 MED ORDER — SODIUM CHLORIDE 0.9 % IV SOLN
2.0000 g | INTRAVENOUS | Status: DC
  Administered 2024-02-11: 2 g via INTRAVENOUS
  Filled 2024-02-10: qty 20

## 2024-02-10 MED ORDER — ENOXAPARIN SODIUM 40 MG/0.4ML IJ SOSY
40.0000 mg | PREFILLED_SYRINGE | INTRAMUSCULAR | Status: DC
  Administered 2024-02-11: 40 mg via SUBCUTANEOUS
  Filled 2024-02-10: qty 0.4

## 2024-02-10 MED ORDER — POLYETHYLENE GLYCOL 3350 17 G PO PACK: 17.0000 g | PACK | Freq: Every day | ORAL | Status: DC | PRN

## 2024-02-10 SURGICAL SUPPLY — 35 items
BAG COUNTER SPONGE SURGICOUNT (BAG) IMPLANT
CHLORAPREP W/TINT 26 (MISCELLANEOUS) ×1 IMPLANT
COVER SURGICAL LIGHT HANDLE (MISCELLANEOUS) ×1 IMPLANT
CUTTER FLEX LINEAR 45M (STAPLE) IMPLANT
DERMABOND ADVANCED .7 DNX12 (GAUZE/BANDAGES/DRESSINGS) ×1 IMPLANT
DRAIN CHANNEL 19F RND (DRAIN) IMPLANT
ELECT REM PT RETURN 15FT ADLT (MISCELLANEOUS) ×1 IMPLANT
ENDOLOOP SUT PDS II 0 18 (SUTURE) IMPLANT
EVACUATOR SILICONE 100CC (DRAIN) IMPLANT
GLOVE BIO SURGEON STRL SZ 6 (GLOVE) ×1 IMPLANT
GLOVE INDICATOR 6.5 STRL GRN (GLOVE) ×1 IMPLANT
GOWN STRL REUS W/ TWL LRG LVL3 (GOWN DISPOSABLE) ×1 IMPLANT
GOWN STRL REUS W/ TWL XL LVL3 (GOWN DISPOSABLE) IMPLANT
GRASPER SUT TROCAR 14GX15 (MISCELLANEOUS) IMPLANT
IRRIGATION SUCT STRKRFLW 2 WTP (MISCELLANEOUS) ×1 IMPLANT
KIT BASIN OR (CUSTOM PROCEDURE TRAY) ×1 IMPLANT
KIT TURNOVER KIT A (KITS) ×2 IMPLANT
NDL INSUFFLATION 14GA 120MM (NEEDLE) ×1 IMPLANT
NEEDLE INSUFFLATION 14GA 120MM (NEEDLE) ×1 IMPLANT
RELOAD 45 VASCULAR/THIN (ENDOMECHANICALS) IMPLANT
RELOAD STAPLE 45 2.5 WHT GRN (ENDOMECHANICALS) IMPLANT
RELOAD STAPLE 45 3.5 BLU ETS (ENDOMECHANICALS) IMPLANT
RELOAD STAPLE TA45 3.5 REG BLU (ENDOMECHANICALS) ×1 IMPLANT
SET TUBE SMOKE EVAC HIGH FLOW (TUBING) ×1 IMPLANT
SHEARS HARMONIC 36 ACE (MISCELLANEOUS) ×1 IMPLANT
SLEEVE Z-THREAD 5X100MM (TROCAR) ×1 IMPLANT
SPIKE FLUID TRANSFER (MISCELLANEOUS) ×1 IMPLANT
SUT ETHILON 2 0 PS N (SUTURE) IMPLANT
SUT MNCRL AB 4-0 PS2 18 (SUTURE) ×1 IMPLANT
SYSTEM BAG RETRIEVAL 10MM (BASKET) ×1 IMPLANT
TOWEL OR 17X26 10 PK STRL BLUE (TOWEL DISPOSABLE) ×1 IMPLANT
TRAY FOLEY MTR SLVR 14FR STAT (SET/KITS/TRAYS/PACK) IMPLANT
TRAY LAPAROSCOPIC (CUSTOM PROCEDURE TRAY) ×1 IMPLANT
TROCAR ADV FIXATION 12X100MM (TROCAR) ×1 IMPLANT
TROCAR Z-THREAD OPTICAL 5X100M (TROCAR) ×1 IMPLANT

## 2024-02-10 NOTE — ED Notes (Signed)
 Patient transported to CT

## 2024-02-10 NOTE — ED Notes (Signed)
 ED Provider at bedside.

## 2024-02-10 NOTE — Anesthesia Preprocedure Evaluation (Signed)
 Anesthesia Evaluation  Patient identified by MRN, date of birth, ID band Patient awake    Reviewed: Allergy & Precautions, NPO status , Patient's Chart, lab work & pertinent test results  Airway Mallampati: II  TM Distance: >3 FB Neck ROM: Full    Dental  (+) Dental Advisory Given   Pulmonary neg pulmonary ROS   breath sounds clear to auscultation       Cardiovascular negative cardio ROS  Rhythm:Regular Rate:Normal     Neuro/Psych negative neurological ROS     GI/Hepatic Neg liver ROS,GERD  Medicated,,Acute appendicitis    Endo/Other  negative endocrine ROS    Renal/GU negative Renal ROS     Musculoskeletal   Abdominal   Peds  Hematology negative hematology ROS (+)   Anesthesia Other Findings   Reproductive/Obstetrics                             Anesthesia Physical Anesthesia Plan  ASA: 2 and emergent  Anesthesia Plan: General   Post-op Pain Management: Ofirmev IV (intra-op)* and Toradol IV (intra-op)*   Induction: Intravenous  PONV Risk Score and Plan: 3 and Dexamethasone , Ondansetron , Midazolam and Treatment may vary due to age or medical condition  Airway Management Planned: Oral ETT  Additional Equipment:   Intra-op Plan:   Post-operative Plan: Extubation in OR  Informed Consent: I have reviewed the patients History and Physical, chart, labs and discussed the procedure including the risks, benefits and alternatives for the proposed anesthesia with the patient or authorized representative who has indicated his/her understanding and acceptance.     Dental advisory given  Plan Discussed with: CRNA  Anesthesia Plan Comments:        Anesthesia Quick Evaluation

## 2024-02-10 NOTE — Transfer of Care (Signed)
 Immediate Anesthesia Transfer of Care Note  Patient: Rhonda Munoz  Procedure(s) Performed: APPENDECTOMY, LAPAROSCOPIC  Patient Location: PACU  Anesthesia Type:General  Level of Consciousness: awake, alert , and oriented  Airway & Oxygen Therapy: Patient Spontanous Breathing and Patient connected to face mask oxygen  Post-op Assessment: Report given to RN and Post -op Vital signs reviewed and stable  Post vital signs: Reviewed and stable  Last Vitals:  Vitals Value Taken Time  BP 146/78 02/10/24 16:00  Temp    Pulse 103 02/10/24 16:04  Resp 18 02/10/24 16:04  SpO2 96 % 02/10/24 16:04  Vitals shown include unfiled device data.  Last Pain:  Vitals:   02/10/24 1400  TempSrc: Oral  PainSc:       Patients Stated Pain Goal: 4 (02/10/24 1400)  Complications: No notable events documented.

## 2024-02-10 NOTE — ED Provider Notes (Signed)
 Emergency Department Provider Note   I have reviewed the triage vital signs and the nursing notes.   HISTORY  Chief Complaint Abdominal Pain   HPI Rhonda Munoz is a 68 y.o. female presents the emergency department for evaluation of abdominal pain with nausea vomiting.  Patient states she had generalized abdominal discomfort last night with some associated nausea.  She tried eating but symptoms became somewhat worse.  She was up through the night with soreness and intermittent sharp pains.  She had multiple episodes of emesis.  This morning she has noticed that pain has migrated now to the right lower quadrant and is feeling significant pain with moving, walking, riding in the car.  Past history of hysterectomy and cholecystectomy.  Past Medical History:  Diagnosis Date   Allergy    Cataract    Depression    GERD (gastroesophageal reflux disease)    Hemorrhoids    Hyperlipidemia     Review of Systems  Constitutional: No fever/chills Cardiovascular: Denies chest pain. Respiratory: Denies shortness of breath. Gastrointestinal: RLQ abdominal pain. Positive nausea and vomiting.  No diarrhea.  No constipation. Neurological: Negative for headaches.  ____________________________________________   PHYSICAL EXAM:  VITAL SIGNS: ED Triage Vitals  Encounter Vitals Group     BP 02/10/24 0841 138/84     Pulse Rate 02/10/24 0841 96     Resp 02/10/24 0841 20     Temp 02/10/24 0841 98.4 F (36.9 C)     Temp Source 02/10/24 0841 Oral     SpO2 02/10/24 0841 100 %     Weight 02/10/24 0839 184 lb (83.5 kg)     Height 02/10/24 0839 5' 7 (1.702 m)   Constitutional: Alert and oriented. Well appearing and in no acute distress. Eyes: Conjunctivae are normal. Head: Atraumatic. Nose: No congestion/rhinnorhea. Mouth/Throat: Mucous membranes are moist.   Neck: No stridor.  Cardiovascular: Normal rate, regular rhythm. Good peripheral circulation. Grossly normal heart sounds.    Respiratory: Normal respiratory effort.  No retractions. Lungs CTAB. Gastrointestinal: Tenderness focally in the right lower quadrant with positive Rovsing's sign. no distention.  Musculoskeletal: No lower extremity tenderness nor edema. No gross deformities of extremities. Neurologic:  Normal speech and language.  Skin:  Skin is warm, dry and intact. No rash noted.   ____________________________________________   LABS (all labs ordered are listed, but only abnormal results are displayed)  Labs Reviewed  LIPASE, BLOOD - Abnormal; Notable for the following components:      Result Value   Lipase 57 (*)    All other components within normal limits  COMPREHENSIVE METABOLIC PANEL WITH GFR - Abnormal; Notable for the following components:   Glucose, Bld 156 (*)    Total Protein 8.3 (*)    All other components within normal limits  CBC - Abnormal; Notable for the following components:   WBC 18.7 (*)    All other components within normal limits  URINALYSIS, ROUTINE W REFLEX MICROSCOPIC - Abnormal; Notable for the following components:   Hgb urine dipstick TRACE (*)    All other components within normal limits  URINALYSIS, MICROSCOPIC (REFLEX) - Abnormal; Notable for the following components:   Bacteria, UA RARE (*)    All other components within normal limits  CBC - Abnormal; Notable for the following components:   WBC 13.5 (*)    All other components within normal limits  CBC - Abnormal; Notable for the following components:   WBC 12.3 (*)    Hemoglobin 11.1 (*)  All other components within normal limits  BASIC METABOLIC PANEL WITH GFR - Abnormal; Notable for the following components:   Glucose, Bld 124 (*)    BUN 7 (*)    All other components within normal limits  CBC - Abnormal; Notable for the following components:   RBC 3.71 (*)    Hemoglobin 10.4 (*)    HCT 35.0 (*)    MCHC 29.7 (*)    All other components within normal limits  BASIC METABOLIC PANEL WITH GFR -  Abnormal; Notable for the following components:   Glucose, Bld 110 (*)    BUN 5 (*)    Creatinine, Ser <0.30 (*)    All other components within normal limits  CREATININE, SERUM  MAGNESIUM  SURGICAL PATHOLOGY   ____________________________________________   PROCEDURES  Procedure(s) performed:   Procedures  CRITICAL CARE Performed by: Fonda KANDICE Law Total critical care time: 35 minutes Critical care time was exclusive of separately billable procedures and treating other patients. Critical care was necessary to treat or prevent imminent or life-threatening deterioration. Critical care was time spent personally by me on the following activities: development of treatment plan with patient and/or surrogate as well as nursing, discussions with consultants, evaluation of patient's response to treatment, examination of patient, obtaining history from patient or surrogate, ordering and performing treatments and interventions, ordering and review of laboratory studies, ordering and review of radiographic studies, pulse oximetry and re-evaluation of patient's condition.  Fonda Law, MD Emergency Medicine  ____________________________________________   INITIAL IMPRESSION / ASSESSMENT AND PLAN / ED COURSE  Pertinent labs & imaging results that were available during my care of the patient were reviewed by me and considered in my medical decision making (see chart for details).   This patient is Presenting for Evaluation of abdominal pain, which does require a range of treatment options, and is a complaint that involves a high risk of morbidity and mortality.  The Differential Diagnoses includes but is not exclusive to acute appendicitis, urinary tract infection, endometriosis, bowel obstruction, hernia, colitis, renal colic, gastroenteritis, volvulus etc.   Critical Interventions-    Medications  dextrose  5 % and 0.45 % NaCl with KCl 20 mEq/L infusion ( Intravenous New Bag/Given 02/11/24  0750)  ondansetron  (ZOFRAN ) injection 4 mg (4 mg Intravenous Given 02/10/24 0857)  sodium chloride  0.9 % bolus 1,000 mL (0 mLs Intravenous Stopped 02/10/24 1113)  fentaNYL  (SUBLIMAZE ) injection 50 mcg (50 mcg Intravenous Given 02/10/24 0950)  ondansetron  (ZOFRAN ) injection 4 mg (4 mg Intravenous Given 02/10/24 0950)  iohexol  (OMNIPAQUE ) 300 MG/ML solution 100 mL (100 mLs Intravenous Contrast Given 02/10/24 1012)  fentaNYL  (SUBLIMAZE ) injection 50 mcg (50 mcg Intravenous Given 02/10/24 1101)  cefTRIAXone  (ROCEPHIN ) 2 g in sodium chloride  0.9 % 100 mL IVPB (0 g Intravenous Stopped 02/10/24 1228)    And  metroNIDAZOLE  (FLAGYL ) IVPB 500 mg ( Intravenous Restarted 02/10/24 1311)  0.9 %  sodium chloride  infusion ( Intravenous Stopped 02/10/24 1252)  promethazine  (PHENERGAN ) 12.5 mg in sodium chloride  0.9 % 50 mL IVPB (0 mg Intravenous Stopped 02/10/24 1310)  morphine  (PF) 4 MG/ML injection 4 mg (4 mg Intravenous Given 02/10/24 1311)  promethazine  (PHENERGAN ) 25 MG/ML injection (  Return to [facility] 02/10/24 1249)  promethazine  (PHENERGAN ) 25 MG/ML injection (  Return to [facility] 02/10/24 1250)  chlorhexidine  (PERIDEX ) 0.12 % solution 15 mL (15 mLs Mouth/Throat Given 02/10/24 1401)    Or  Oral care mouth rinse ( Mouth Rinse See Alternative 02/10/24 1401)    Reassessment after  intervention:  pain improved.   Clinical Laboratory Tests Ordered, included CBC with leukocytosis. No AKI.   Radiologic Tests Ordered, included CT abdomen/pelvis. I independently interpreted the images and agree with radiology interpretation.   Cardiac Monitor Tracing which shows NSR.    Consult complete with General Surgery Georgia Louder). Discussed case. Patient to go to Pre-op at [facility] for surgery. [facility] to transport.   Medical Decision Making: Summary:  Patient presents emergency department abdominal pain.  Presentation concerning for acute appendicitis.  Plan for CT abdomen pelvis.  CBC shows leukocytosis to 18.  Reevaluation  with update and discussion with patient.  Discussed CT findings concerning for acute appendicitis.  Patient to be transferred to the preop area at [facility] for surgery.   Patient's presentation is most consistent with acute presentation with potential threat to life or bodily function.   Disposition: transfer for surgery  ____________________________________________  FINAL CLINICAL IMPRESSION(S) / ED DIAGNOSES  Final diagnoses:  Acute appendicitis with localized peritonitis, without perforation, abscess, or gangrene     NEW OUTPATIENT MEDICATIONS STARTED DURING THIS VISIT:  Discharge Medication List as of 02/12/2024  9:39 AM     START taking these medications   Details  ciprofloxacin  (CIPRO ) 500 MG tablet Take 1 tablet (500 mg total) by mouth 2 (two) times daily., Starting Fri 02/12/2024, Normal    metroNIDAZOLE  (FLAGYL ) 500 MG tablet Take 1 tablet (500 mg total) by mouth 2 (two) times daily for 5 days., Starting Fri 02/12/2024, Until Wed 02/17/2024, Normal    acetaminophen  (TYLENOL ) 500 MG tablet Take 2 tablets (1,000 mg total) by mouth every 6 (six) hours as needed for mild pain (pain score 1-3), fever or headache., Starting Fri 02/12/2024, OTC    traMADol  (ULTRAM ) 50 MG tablet Take 1 tablet (50 mg total) by mouth every 6 (six) hours as needed for moderate pain (pain score 4-6)., Starting Fri 02/12/2024, Normal        Note:  This document was prepared using Dragon voice recognition software and may include unintentional dictation errors.  Fonda Law, MD, [facility] Emergency Medicine    Jesusa Stenerson, Fonda MATSU, MD 02/19/24 [phone]

## 2024-02-10 NOTE — Op Note (Signed)
 Operative Report  Rhonda Munoz 68 y.o. female  [id number]  [id number]  02/10/2024  Surgeon: Aldon Hung MD FACS    Procedure performed: Laparoscopic Appendectomy   Preop diagnosis: Acute appendicitis   Post-op diagnosis/intraop findings: Acute perforated appendicitis with localized peritonitis   Specimens: appendix   EBL: minimal   Complications: none   Description of procedure: After obtaining informed consent the patient was brought to the operating room. Antibiotics were administered. SCD's were applied. General endotracheal anesthesia was initiated and a formal time-out was performed. Foley catheter was inserted which is removed at the end of the case. The abdomen was prepped and draped in the usual sterile fashion and the abdomen was entered using an optical entry in the left upper quadrant and insufflated to 15 mmHg. The abdomen was inspected and there is no evidence of injury from our entry. A low midline 5 mm trocar and a left lower quadrant 12 mm trocar were introduced under direct visualization following infiltration with local. The patient was then placed in Trendelenburg and rotated to the left and the omentum and small bowel was reflected cephalad.  She had almost a cecal bascule, with the cecum rotated towards the midline, ileocecal valve and terminal ileum extending to the right where much of the small bowel was found lateral to the cecum, and the appendix diving into the retroperitoneum medial and posterior along the cecum.  The appendix is noted to be acutely inflamed with gangrenous changes and early perforation along the mid body with localized peritonitis.  A careful dissection using the harmonic scalpel and blunt dissection ensued to mobilize the appendix out of the retroperitoneum, ensuring no injury to the surrounding retroperitoneal structures, cecum or terminal ileum.  We were able to create a window in the mesoappendix at its base and then a 45 mm blue load Endo GIA  stapler was used to transect the appendix from the cecum.   hemostasis was ensured.  The harmonic scalpel was then used to divide the mesoappendix, ensuring hemostasis as we went.  The appendix was placed in an Endo Catch bag and removed through our 12 mm trocar site. The staple line was reinspected and confirmed to be intact, hemostatic, and viable.  The cecum and ascending colon were placed in normal anatomic position.  The small bowel was run several feet from the ilececal valve proximally with no other abnormalities identified. The omentum was brought down over the bowel.  The 12mm trocar site in the left lower quadrant was closed with a 0 vicryl in the fascia under direct visualization using a PMI device. The abdomen was desufflated and all trocars removed. The skin incisions were closed with subcuticular 4-0 monocryl and Dermabond. The patient was awakened, extubated and transported to the recovery room in stable condition.    All counts were correct at the completion of the case.

## 2024-02-10 NOTE — Discharge Instructions (Signed)

## 2024-02-10 NOTE — ED Triage Notes (Signed)
 C/o lower abdominal pain, N/V since 0400. Abdominal pain started hurting in mid abdomen yesterday. Last BM during the night, not diarrhea.   Hx of cholecystectomy.

## 2024-02-10 NOTE — ED Notes (Signed)
 Unable to void at this time.

## 2024-02-10 NOTE — Anesthesia Procedure Notes (Signed)
 Procedure Name: Intubation Date/Time: 02/10/2024 2:49 PM  Performed by: Micky Albee, CRNAPre-anesthesia Checklist: Patient identified, Emergency Drugs available, Suction available, Patient being monitored and Timeout performed Patient Re-evaluated:Patient Re-evaluated prior to induction Oxygen Delivery Method: Circle system utilized Preoxygenation: Pre-oxygenation with 100% oxygen Induction Type: IV induction, Rapid sequence and Cricoid Pressure applied Laryngoscope Size: Mac and 3 Grade View: Grade II Tube type: Oral Tube size: 7.0 mm Number of attempts: 1 Airway Equipment and Method: Stylet Placement Confirmation: ETT inserted through vocal cords under direct vision, positive ETCO2 and breath sounds checked- equal and bilateral Secured at: 22 cm Tube secured with: Tape Dental Injury: Teeth and Oropharynx as per pre-operative assessment

## 2024-02-10 NOTE — H&P (Signed)
 H&P Note  Rhonda Munoz December 07, 1955  [id number].    Requesting MD: Abby Hocking, MD Chief Complaint/Reason for Consult: Acute appendicitis HPI:  Patient is a 68 year old female who presented to [facility] today with abdominal pain that started yesterday. Pain was initially in mid abdomen and associated with nausea and vomiting. Unable to sleep secondary to intermittent pain and vomiting. Noted around 0400 this AM that pain had localized more to the RLQ and seems worse with walking, moving, or riding in the car. PMH significant for HLD, GERD, depression. Prior abdominal surgery includes tubal ligation, vaginal hysterectomy and cholecystectomy. She is not on any blood thinners. Last PO intake was yesterday evening. Allergies/intolerances as listed in chart.   ROS: Negative other than HPI  Family History  Problem Relation Age of Onset   Breast cancer Mother    Hypertension Mother    Stroke Mother    Colon polyps Mother    Arthritis Mother    Asthma Mother    Cancer Mother    Hearing loss Mother    Miscarriages / [place] Mother    Obesity Mother    Diabetes Father    Heart attack Father    Heart disease Father    Hypertension Father    Hyperlipidemia Father    Arthritis Father    Obesity Father    Varicose Veins Father    Cancer Sister    Colon cancer Sister    Heart failure Maternal Grandmother    Colon cancer Maternal Grandfather    Heart failure Paternal Grandmother    Cancer Sister    Esophageal cancer Neg Hx    Rectal cancer Neg Hx    Stomach cancer Neg Hx     Past Medical History:  Diagnosis Date   Allergy    Cataract    Depression    GERD (gastroesophageal reflux disease)    Hemorrhoids    Hyperlipidemia     Past Surgical History:  Procedure Laterality Date   ABDOMINAL HYSTERECTOMY     CATARACT EXTRACTION, BILATERAL Bilateral 08/2022   second one 09/2022   CHOLECYSTECTOMY     EYE SURGERY  2024   Cataract   FINGER SURGERY Right    index finger  x 1, middle finger x 2   GALLBLADDER SURGERY     TONSILLECTOMY     TUBAL LIGATION      Social History:  reports that she has never smoked. She has never used smokeless tobacco. She reports current alcohol use. She reports that she does not use drugs.  Allergies:  Allergies  Allergen Reactions   Clenpiq  [Sod Picosulfate-Mag Ox-Cit Acd]     Sweating, vomiting and bad headache   Codeine     Nausea, Rash   Effexor [Venlafaxine]     VISUAL HALLUCINATIONS   Hydromorphone     Tachycardia, rash   Procaine     Nausea, Rash    (Not in a hospital admission)   Blood pressure 133/74, pulse 88, temperature 98.4 F (36.9 C), temperature source Oral, resp. rate 17, height 5' 7 (1.702 m), weight 83.5 kg, SpO2 95%. Physical Exam:  General: pleasant, WD, overweight female who is laying in bed in NAD HEENT: head is normocephalic, atraumatic.  Sclera are noninjected.  Pupils equal and round.  Ears and nose without any masses or lesions.  Mouth is pink and moist Heart: regular, rate, and rhythm. Palpable radial and pedal pulses bilaterally Lungs: No wheezes, rhonchi, or rales noted.  Respiratory  effort nonlabored Abd: soft, TTP in RLQ with peritonitis, ND, no masses, hernias, or organomegaly MS: all 4 extremities are symmetrical with no cyanosis, clubbing, or edema. Skin: warm and dry with no masses, lesions, or rashes Neuro: Cranial nerves 2-12 grossly intact, sensation is normal throughout Psych: A&Ox3 with an appropriate affect.   Results for orders placed or performed during the hospital encounter of 02/10/24 (from the past 48 hours)  Urinalysis, Routine w reflex microscopic -Urine, Clean Catch     Status: Abnormal   Collection Time: 02/10/24  8:43 AM  Result Value Ref Range   Color, Urine YELLOW YELLOW   APPearance CLEAR CLEAR   Specific Gravity, Urine 1.020 1.005 - 1.030   pH 6.0 5.0 - 8.0   Glucose, UA NEGATIVE NEGATIVE mg/dL   Hgb urine dipstick TRACE (A) NEGATIVE   Bilirubin  Urine NEGATIVE NEGATIVE   Ketones, ur NEGATIVE NEGATIVE mg/dL   Protein, ur NEGATIVE NEGATIVE mg/dL   Nitrite NEGATIVE NEGATIVE   Leukocytes,Ua NEGATIVE NEGATIVE    Comment: Performed at [facility], [address] [place], [place] [address]  Urinalysis, Microscopic (reflex)     Status: Abnormal   Collection Time: 02/10/24  8:43 AM  Result Value Ref Range   RBC / HPF 0-5 0 - 5 RBC/hpf   WBC, UA 0-5 0 - 5 WBC/hpf   Bacteria, UA RARE (A) NONE SEEN   Squamous Epithelial / HPF 0-5 0 - 5 /HPF   Mucus PRESENT     Comment: Performed at [facility], [address] [place], [place] [address]  Lipase, blood     Status: Abnormal   Collection Time: 02/10/24  8:47 AM  Result Value Ref Range   Lipase 57 (H) 11 - 51 U/L    Comment: Performed at [facility], [address] [place], [place] [address]  Comprehensive metabolic panel     Status: Abnormal   Collection Time: 02/10/24  8:47 AM  Result Value Ref Range   Sodium 138 135 - 145 mmol/L   Potassium 3.9 3.5 - 5.1 mmol/L    Comment: HEMOLYSIS AT THIS LEVEL MAY AFFECT RESULT   Chloride 98 98 - 111 mmol/L   CO2 25 22 - 32 mmol/L   Glucose, Bld 156 (H) 70 - 99 mg/dL    Comment: Glucose reference range applies only to samples taken after fasting for at least 8 hours.   BUN 10 8 - 23 mg/dL   Creatinine, Ser 7.42 0.44 - 1.00 mg/dL   Calcium 9.8 8.9 - 59.5 mg/dL   Total Protein 8.3 (H) 6.5 - 8.1 g/dL   Albumin 4.8 3.5 - 5.0 g/dL   AST 21 15 - 41 U/L   ALT 15 0 - 44 U/L   Alkaline Phosphatase 110 38 - 126 U/L   Total Bilirubin 0.4 0.0 - 1.2 mg/dL   GFR, Estimated >63 >87 mL/min    Comment: (NOTE) Calculated using the CKD-EPI Creatinine Equation (2021)    Anion gap 15 5 - 15    Comment: Performed at [facility], [address] [place], [place] [address]  CBC     Status: Abnormal   Collection Time: 02/10/24  8:47 AM  Result Value Ref Range   WBC 18.7 (H) 4.0 - 10.5 K/uL   RBC 4.89 3.87 -  5.11 MIL/uL   Hemoglobin 13.9 12.0 - 15.0 g/dL   HCT 29.5 18.8 - 41.6 %   MCV  87.1 80.0 - 100.0 fL   MCH 28.4 26.0 - 34.0 pg   MCHC 32.6 30.0 - 36.0 g/dL   RDW 16.1 09.6 - 04.5 %   Platelets 287 150 - 400 K/uL   nRBC 0.0 0.0 - 0.2 %    Comment: Performed at [facility], [address] [place], [place] [address]   CT ABDOMEN PELVIS W CONTRAST Result Date: 02/10/2024 CLINICAL DATA:  Right lower quadrant abdominal pain nausea and vomiting starting at 4 a.m. EXAM: CT ABDOMEN AND PELVIS WITH CONTRAST TECHNIQUE: Multidetector CT imaging of the abdomen and pelvis was performed using the standard protocol following bolus administration of intravenous contrast. RADIATION DOSE REDUCTION: This exam was performed according to the departmental dose-optimization program which includes automated exposure control, adjustment of the mA and/or kV according to patient size and/or use of iterative reconstruction technique. CONTRAST:  100mL OMNIPAQUE IOHEXOL 300 MG/ML  SOLN COMPARISON:  None Available. FINDINGS: Lower chest: Unremarkable Hepatobiliary: 5 mm hypodense lesion in segment 3 of the liver on image 9 series 304 is technically too small to characterize although statistically likely to be a small benign lesions such as cyst. No further imaging workup of this lesion is indicated. Cholecystectomy. Pancreas: Unremarkable Spleen: Unremarkable Adrenals/Urinary Tract: No significant findings. Stomach/Bowel: Sigmoid colon diverticulosis. Mobile cecum the central abdomen observed with an inflamed blind-ending structure compatible with appendix in the right paracentral position at the level of the iliac crests measuring up to 2.3 cm in diameter, with a 0.9 cm hyperdensity favoring appendicolith lodged at its ostium, and periappendiceal fluid/stranding as shown on image 85 series 601. Appearance compatible with acute appendicitis. No extraluminal gas or drainable abscess. Fat deposition in the wall of the terminal  ileum and cecum, usually incidental although this can have a weak association with inflammatory bowel disease. Vascular/Lymphatic: Mild abdominal aortic atheromatous vascular calcification. Reproductive: Uterus absent. Other: No supplemental non-categorized findings. Musculoskeletal: Transitional S1 vertebra. Left greater than right degenerative facet arthropathy at L5-S1. Hemangioma eccentric to the right in the L4 vertebral body. IMPRESSION: 1. Acute appendicitis, appendiceal diameter up to 2.3 cm, with a 0.9 cm appendicolith lodged at the appendiceal ostium. 2. Mobile cecum extends towards the midline. 3. Fat deposition in the wall of the terminal ileum and cecum, usually incidental although this can have a weak association with inflammatory bowel disease. 4. Sigmoid colon diverticulosis. 5. Left greater than right degenerative facet arthropathy at L5-S1. 6. Transitional S1 vertebra. 7.  Aortic Atherosclerosis (ICD10-I70.0). Electronically Signed   By: Freida Jes M.D.   On: 02/10/2024 11:47      Assessment/Plan Acute appendicitis - CT with dilated appendix and appendicolith lodged at appendiceal ostium, mobile cecum that extends towards the midline, fat deposition in wall of TI and cecum - WBC 18K, afebrile and HD stable  - of note recent colonoscopy in 2024 without concern for IBD - History, exam and imaging consistent with acute appendicitis. No evidence of perforation or abscess on CT. Discussed operative vs non-operative intervention.  I have explained the procedure, risks, and aftercare of Laparoscopic Appendectomy.  Risks include but are not limited to anesthesia (MI, CVA, death, aspiration, prolonged intubation), bleeding, infection, wound problems, hernia, injury to surrounding structures (viscus, nerves, blood vessels, ureter), need for conversion to open procedure or ileocecectomy, post operative ileus or abscess, stump leak, stump appendicitis and increased risk of DVT/PE.  She seems  to understand and agrees to proceed with surgery. Keep NPO. Start IV abx. Possible d/c from PACU.  If requires admission postoperatively, will plan admission to observation.      I reviewed ED provider notes, last 24 h vitals and pain scores, last 48 h intake and output, last 24 h labs and trends, and last 24 h imaging results.  This care required high  level of medical decision making.   Annetta Killian, [facility] Surgery 02/10/2024, 12:18 PM Please see Amion for pager number during day hours 7:00am-4:30pm

## 2024-02-10 NOTE — Plan of Care (Signed)

## 2024-02-11 ENCOUNTER — Other Ambulatory Visit: Payer: Self-pay | Admitting: Family Medicine

## 2024-02-11 ENCOUNTER — Encounter (HOSPITAL_COMMUNITY): Payer: Self-pay | Admitting: Surgery

## 2024-02-11 LAB — BASIC METABOLIC PANEL WITH GFR
Anion gap: 11 (ref 5–15)
BUN: 7 mg/dL — ABNORMAL LOW (ref 8–23)
CO2: 24 mmol/L (ref 22–32)
Calcium: 8.9 mg/dL (ref 8.9–10.3)
Chloride: 103 mmol/L (ref 98–111)
Creatinine, Ser: 0.56 mg/dL (ref 0.44–1.00)
GFR, Estimated: >60 mL/min (ref >60)
Glucose, Bld: 124 mg/dL — ABNORMAL HIGH (ref 70–99)
Potassium: 3.9 mmol/L (ref 3.5–5.1)
Sodium: 138 mmol/L (ref 135–145)

## 2024-02-11 LAB — CBC
HCT: 36.7 % (ref 36.0–46.0)
Hemoglobin: 11.1 g/dL — ABNORMAL LOW (ref 12.0–15.0)
MCH: 28.5 pg (ref 26.0–34.0)
MCHC: 30.2 g/dL (ref 30.0–36.0)
MCV: 94.3 fL (ref 80.0–100.0)
Platelets: 182 10*3/uL (ref 150–400)
RBC: 3.89 MIL/uL (ref 3.87–5.11)
RDW: 14.5 % (ref 11.5–15.5)
WBC: 12.3 10*3/uL — ABNORMAL HIGH (ref 4.0–10.5)
nRBC: 0 % (ref 0.0–0.2)

## 2024-02-11 MED ORDER — OXYCODONE HCL 5 MG PO TABS
5.0000 mg | ORAL_TABLET | ORAL | Status: DC | PRN
  Administered 2024-02-11 (×2): 5 mg via ORAL
  Filled 2024-02-11 (×2): qty 1

## 2024-02-11 MED ORDER — TRAMADOL HCL 50 MG PO TABS
50.0000 mg | ORAL_TABLET | Freq: Four times a day (QID) | ORAL | Status: DC | PRN
  Administered 2024-02-11 – 2024-02-12 (×2): 50 mg via ORAL
  Filled 2024-02-11 (×2): qty 1

## 2024-02-11 MED ORDER — CARMEX CLASSIC LIP BALM EX OINT
TOPICAL_OINTMENT | CUTANEOUS | Status: DC | PRN
  Filled 2024-02-11: qty 10

## 2024-02-11 MED ORDER — PROCHLORPERAZINE EDISYLATE 10 MG/2ML IJ SOLN
10.0000 mg | Freq: Four times a day (QID) | INTRAMUSCULAR | Status: DC | PRN
  Administered 2024-02-11: 10 mg via INTRAVENOUS
  Filled 2024-02-11: qty 2

## 2024-02-11 MED ORDER — METHOCARBAMOL 500 MG PO TABS
500.0000 mg | ORAL_TABLET | Freq: Three times a day (TID) | ORAL | Status: DC
  Administered 2024-02-11 – 2024-02-12 (×4): 500 mg via ORAL
  Filled 2024-02-11 (×4): qty 1

## 2024-02-11 NOTE — TOC Initial Note (Addendum)
 Transition of Care [facility]) - Initial/Assessment Note    Patient Details  Name: Rhonda Munoz MRN: [id number] Date of Birth: [birth year]  Transition [facility]) CM/SW Contact:    Bari Leys, RN Phone Number: 02/11/2024, 12:52 PM  Clinical Narrative:  Met with patient and family at bedside to introduce role of TOC/NCM and review for dc planning, patient has PCP on file, lives alone in private residence with family support. MOON completed. TOC will continue to follow.                 Expected Discharge Plan: Home/Self Care Barriers to Discharge: Continued Medical Work up   Patient Goals and CMS Choice Patient states their goals for this hospitalization and ongoing recovery are:: return home          Expected Discharge Plan and Services       Living arrangements for the past 2 months: Single Family Home                                      Prior Living Arrangements/Services Living arrangements for the past 2 months: Single Family Home Lives with:: Self Patient language and need for interpreter reviewed:: Yes Do you feel safe going back to the place where you live?: Yes      Need for Family Participation in Patient Care: Yes (Comment) Care giver support system in place?: Yes (comment)   Criminal Activity/Legal Involvement Pertinent to Current Situation/Hospitalization: No - Comment as needed  Activities of Daily Living   ADL Screening (condition at time of admission) Independently performs ADLs?: Yes (appropriate for developmental age) Is the patient deaf or have difficulty hearing?: No Does the patient have difficulty seeing, even when wearing glasses/contacts?: No Does the patient have difficulty concentrating, remembering, or making decisions?: No  Permission Sought/Granted                  Emotional Assessment Appearance:: Appears stated age Attitude/Demeanor/Rapport: Gracious Affect (typically observed): Accepting Orientation: : Oriented  to Self, Oriented to Place, Oriented to  Time, Oriented to Situation Alcohol / Substance Use: Not Applicable Psych Involvement: No (comment)  Admission diagnosis:  Acute appendicitis with localized peritonitis, without perforation, abscess, or gangrene [K35.30] Acute appendicitis [K35.80] Patient Active Problem List   Diagnosis Date Noted   Acute appendicitis 02/10/2024   Lymphadenopathy 08/19/2023   Vulvovaginal candidiasis 07/27/2023   Thrush 07/27/2023   Cough 07/27/2023   Acute sinusitis 07/15/2023   Benign paroxysmal positional vertigo 01/04/2023   Yeast dermatitis 07/02/2022   Depression 06/05/2021   Osteopenia 04/26/2020   Hypercalcemia 01/02/2020   Hyperlipemia 01/02/2020   Prediabetes 01/02/2020   Postmenopausal 12/13/2019   Knee pain, left 09/28/2017   Gastroesophageal reflux disease with esophagitis 05/18/2017   History of colon polyps 05/18/2017   PCP:  Adela Holter, DO Pharmacy:   [id number] - [place], [place] - [address] [place] [address] Phone: [phone] Fax: [phone]  [facility] - [place], [place] - [address] [place] [address] Phone: [phone] Fax: [phone]     Social Drivers of Health (SDOH) Social History: SDOH Screenings   Food Insecurity: No Food Insecurity (02/10/2024)  Housing: Low Risk  (02/10/2024)  Transportation Needs: No Transportation Needs (02/10/2024)  Utilities: Not At Risk (02/10/2024)  Alcohol Screen: Low Risk  (06/01/2023)  Depression (PHQ2-9): Low Risk  (11/16/2023)  Financial Resource Strain: Low Risk  (11/12/2023)  Physical Activity: Sufficiently Active (11/12/2023)  Social Connections: Moderately Integrated (02/10/2024)  Stress: No Stress Concern Present (11/12/2023)  Tobacco Use: Low Risk  (02/10/2024)  Health Literacy: Adequate Health Literacy (06/01/2023)   SDOH Interventions:      Readmission Risk Interventions     No data to display

## 2024-02-11 NOTE — Care Management Obs Status (Signed)
 MEDICARE OBSERVATION STATUS NOTIFICATION   Patient Details  Name: Rhonda Munoz MRN: [id number] Date of Birth: 05/07/1956   Medicare Observation Status Notification Given:       Bari Leys, RN 02/11/2024, 12:45 PM

## 2024-02-11 NOTE — Anesthesia Postprocedure Evaluation (Signed)
 Anesthesia Post Note  Patient: Rhonda Munoz  Procedure(s) Performed: APPENDECTOMY, LAPAROSCOPIC     Patient location during evaluation: PACU Anesthesia Type: General Level of consciousness: awake and alert Pain management: pain level controlled Vital Signs Assessment: post-procedure vital signs reviewed and stable Respiratory status: spontaneous breathing, nonlabored ventilation, respiratory function stable and patient connected to nasal cannula oxygen Cardiovascular status: blood pressure returned to baseline and stable Postop Assessment: no apparent nausea or vomiting Anesthetic complications: no   No notable events documented.  Last Vitals:  Vitals:   02/11/24 0940 02/11/24 1404  BP: 115/74 (!) 100/56  Pulse: 65 70  Resp: 16 15  Temp: 36.6 C 36.7 C  SpO2: 100% 97%    Last Pain:  Vitals:   02/11/24 1726  TempSrc:   PainSc: 0-No pain                 Melvenia Stabs

## 2024-02-11 NOTE — Progress Notes (Signed)
 Progress Note  1 Day Post-Op  Subjective: Pt reports she was feeling really well earlier this AM but now having more pain and nausea after walking 2 laps in the hall. Nausea came on after getting back in bed and after oxycodone. Passed some flatus last night but not much this AM. Her son and daughter in law are visiting with her this AM.   Objective: Vital signs in last 24 hours: Temp:  [97.6 F (36.4 C)-99 F (37.2 C)] 97.6 F (36.4 C) (06/19 0550) Pulse Rate:  [69-100] 69 (06/19 0550) Resp:  [13-20] 17 (06/19 0550) BP: (100-156)/(56-83) 103/60 (06/19 0550) SpO2:  [94 %-100 %] 99 % (06/19 0550)    Intake/Output from previous day: 06/18 0701 - 06/19 0700 In: 3807.4 [P.O.:600; I.V.:2007.4; IV Piggyback:1200] Out: 1105 [Urine:1100; Blood:5] Intake/Output this shift: No intake/output data recorded.  PE: General: pleasant, WD, WN female who is laying in bed and appears uncomfortable Heart: regular, rate, and rhythm.  Lungs:  Respiratory effort nonlabored Abd: soft, appropriately TTP, mild distention, incisions C/D/I Psych: A&Ox3 with an appropriate affect.    Lab Results:  Recent Labs    02/10/24 1734 02/11/24 0450  WBC 13.5* 12.3*  HGB 12.5 11.1*  HCT 40.7 36.7  PLT 212 182   BMET Recent Labs    02/10/24 0847 02/10/24 1734 02/11/24 0450  NA 138  --  138  K 3.9  --  3.9  CL 98  --  103  CO2 25  --  24  GLUCOSE 156*  --  124*  BUN 10  --  7*  CREATININE 0.76 0.69 0.56  CALCIUM 9.8  --  8.9   PT/INR No results for input(s): LABPROT, INR in the last 72 hours. CMP     Component Value Date/Time   NA 138 02/11/2024 0450   NA 139 08/12/2023 0840   K 3.9 02/11/2024 0450   CL 103 02/11/2024 0450   CO2 24 02/11/2024 0450   GLUCOSE 124 (H) 02/11/2024 0450   BUN 7 (L) 02/11/2024 0450   BUN 9 08/12/2023 0840   CREATININE 0.56 02/11/2024 0450   CREATININE 0.73 02/26/2022 0838   CALCIUM 8.9 02/11/2024 0450   PROT 8.3 (H) 02/10/2024 0847   PROT 7.0  08/12/2023 0840   ALBUMIN 4.8 02/10/2024 0847   ALBUMIN 4.3 08/12/2023 0840   AST 21 02/10/2024 0847   ALT 15 02/10/2024 0847   ALKPHOS 110 02/10/2024 0847   BILITOT 0.4 02/10/2024 0847   BILITOT 0.3 08/12/2023 0840   GFRNONAA >60 02/11/2024 0450   Lipase     Component Value Date/Time   LIPASE 57 (H) 02/10/2024 0847       Studies/Results: CT ABDOMEN PELVIS W CONTRAST Result Date: 02/10/2024 CLINICAL DATA:  Right lower quadrant abdominal pain nausea and vomiting starting at 4 a.m. EXAM: CT ABDOMEN AND PELVIS WITH CONTRAST TECHNIQUE: Multidetector CT imaging of the abdomen and pelvis was performed using the standard protocol following bolus administration of intravenous contrast. RADIATION DOSE REDUCTION: This exam was performed according to the departmental dose-optimization program which includes automated exposure control, adjustment of the mA and/or kV according to patient size and/or use of iterative reconstruction technique. CONTRAST:  100mL OMNIPAQUE IOHEXOL 300 MG/ML  SOLN COMPARISON:  None Available. FINDINGS: Lower chest: Unremarkable Hepatobiliary: 5 mm hypodense lesion in segment 3 of the liver on image 9 series 304 is technically too small to characterize although statistically likely to be a small benign lesions such as cyst. No further imaging  workup of this lesion is indicated. Cholecystectomy. Pancreas: Unremarkable Spleen: Unremarkable Adrenals/Urinary Tract: No significant findings. Stomach/Bowel: Sigmoid colon diverticulosis. Mobile cecum the central abdomen observed with an inflamed blind-ending structure compatible with appendix in the right paracentral position at the level of the iliac crests measuring up to 2.3 cm in diameter, with a 0.9 cm hyperdensity favoring appendicolith lodged at its ostium, and periappendiceal fluid/stranding as shown on image 85 series 601. Appearance compatible with acute appendicitis. No extraluminal gas or drainable abscess. Fat deposition in  the wall of the terminal ileum and cecum, usually incidental although this can have a weak association with inflammatory bowel disease. Vascular/Lymphatic: Mild abdominal aortic atheromatous vascular calcification. Reproductive: Uterus absent. Other: No supplemental non-categorized findings. Musculoskeletal: Transitional S1 vertebra. Left greater than right degenerative facet arthropathy at L5-S1. Hemangioma eccentric to the right in the L4 vertebral body. IMPRESSION: 1. Acute appendicitis, appendiceal diameter up to 2.3 cm, with a 0.9 cm appendicolith lodged at the appendiceal ostium. 2. Mobile cecum extends towards the midline. 3. Fat deposition in the wall of the terminal ileum and cecum, usually incidental although this can have a weak association with inflammatory bowel disease. 4. Sigmoid colon diverticulosis. 5. Left greater than right degenerative facet arthropathy at L5-S1. 6. Transitional S1 vertebra. 7.  Aortic Atherosclerosis (ICD10-I70.0). Electronically Signed   By: Freida Jes M.D.   On: 02/10/2024 11:47    Anti-infectives: Anti-infectives (From admission, onward)    Start     Dose/Rate Route Frequency Ordered Stop   02/11/24 1200  cefTRIAXone (ROCEPHIN) 2 g in sodium chloride  0.9 % 100 mL IVPB        2 g 200 mL/hr over 30 Minutes Intravenous Every 24 hours 02/10/24 1650     02/10/24 2200  metroNIDAZOLE (FLAGYL) IVPB 500 mg        500 mg 100 mL/hr over 60 Minutes Intravenous Every 12 hours 02/10/24 1650     02/10/24 1130  cefTRIAXone (ROCEPHIN) 2 g in sodium chloride  0.9 % 100 mL IVPB       Placed in And Linked Group   2 g 200 mL/hr over 30 Minutes Intravenous  Once 02/10/24 1117 02/10/24 1228   02/10/24 1130  metroNIDAZOLE (FLAGYL) IVPB 500 mg       Placed in And Linked Group   500 mg 100 mL/hr over 60 Minutes Intravenous  Once 02/10/24 1117 02/10/24 1329        Assessment/Plan  Perforated acute appendicitis  POD1 S/P laparoscopic appendectomy  - pt with a lot  of soreness this AM and some nausea - passed flatus yesterday but not much this AM - diet as tolerated - encouraged mobilization  - continue IV abx here but will likely send home on some PO abx - possible DC this afternoon vs tomorrow pending progress   FEN: FLD, IVF @75  cc/h VTE: LMWH ID: rocephin/flagyl 6/18>>   LOS: 0 days     Annetta Killian, [facility] 02/11/2024, 9:14 AM Please see Amion for pager number during day hours 7:00am-4:30pm

## 2024-02-12 ENCOUNTER — Other Ambulatory Visit (HOSPITAL_COMMUNITY): Payer: Self-pay

## 2024-02-12 DIAGNOSIS — E785 Hyperlipidemia, unspecified: Secondary | ICD-10-CM | POA: Diagnosis present

## 2024-02-12 DIAGNOSIS — Z9049 Acquired absence of other specified parts of digestive tract: Secondary | ICD-10-CM | POA: Diagnosis not present

## 2024-02-12 DIAGNOSIS — Z83438 Family history of other disorder of lipoprotein metabolism and other lipidemia: Secondary | ICD-10-CM | POA: Diagnosis not present

## 2024-02-12 DIAGNOSIS — Z8261 Family history of arthritis: Secondary | ICD-10-CM | POA: Diagnosis not present

## 2024-02-12 DIAGNOSIS — Z833 Family history of diabetes mellitus: Secondary | ICD-10-CM | POA: Diagnosis not present

## 2024-02-12 DIAGNOSIS — Z809 Family history of malignant neoplasm, unspecified: Secondary | ICD-10-CM | POA: Diagnosis not present

## 2024-02-12 DIAGNOSIS — Z9841 Cataract extraction status, right eye: Secondary | ICD-10-CM | POA: Diagnosis not present

## 2024-02-12 DIAGNOSIS — K219 Gastro-esophageal reflux disease without esophagitis: Secondary | ICD-10-CM | POA: Diagnosis present

## 2024-02-12 DIAGNOSIS — Z800 Family history of malignant neoplasm of digestive organs: Secondary | ICD-10-CM | POA: Diagnosis not present

## 2024-02-12 DIAGNOSIS — Z823 Family history of stroke: Secondary | ICD-10-CM | POA: Diagnosis not present

## 2024-02-12 DIAGNOSIS — Z822 Family history of deafness and hearing loss: Secondary | ICD-10-CM | POA: Diagnosis not present

## 2024-02-12 DIAGNOSIS — Z90710 Acquired absence of both cervix and uterus: Secondary | ICD-10-CM | POA: Diagnosis not present

## 2024-02-12 DIAGNOSIS — K353 Acute appendicitis with localized peritonitis, without perforation or gangrene: Secondary | ICD-10-CM | POA: Diagnosis present

## 2024-02-12 DIAGNOSIS — Z803 Family history of malignant neoplasm of breast: Secondary | ICD-10-CM | POA: Diagnosis not present

## 2024-02-12 DIAGNOSIS — Z884 Allergy status to anesthetic agent status: Secondary | ICD-10-CM | POA: Diagnosis not present

## 2024-02-12 DIAGNOSIS — K381 Appendicular concretions: Secondary | ICD-10-CM | POA: Diagnosis present

## 2024-02-12 DIAGNOSIS — Z83719 Family history of colon polyps, unspecified: Secondary | ICD-10-CM | POA: Diagnosis not present

## 2024-02-12 DIAGNOSIS — Z9842 Cataract extraction status, left eye: Secondary | ICD-10-CM | POA: Diagnosis not present

## 2024-02-12 DIAGNOSIS — K3532 Acute appendicitis with perforation and localized peritonitis, without abscess: Secondary | ICD-10-CM | POA: Diagnosis present

## 2024-02-12 DIAGNOSIS — Z825 Family history of asthma and other chronic lower respiratory diseases: Secondary | ICD-10-CM | POA: Diagnosis not present

## 2024-02-12 DIAGNOSIS — Z9089 Acquired absence of other organs: Secondary | ICD-10-CM | POA: Diagnosis not present

## 2024-02-12 DIAGNOSIS — Z9889 Other specified postprocedural states: Secondary | ICD-10-CM | POA: Diagnosis not present

## 2024-02-12 DIAGNOSIS — F32A Depression, unspecified: Secondary | ICD-10-CM | POA: Diagnosis present

## 2024-02-12 DIAGNOSIS — Z9851 Tubal ligation status: Secondary | ICD-10-CM | POA: Diagnosis not present

## 2024-02-12 DIAGNOSIS — Z8249 Family history of ischemic heart disease and other diseases of the circulatory system: Secondary | ICD-10-CM | POA: Diagnosis not present

## 2024-02-12 LAB — MAGNESIUM: Magnesium: 2.2 mg/dL (ref 1.7–2.4)

## 2024-02-12 LAB — BASIC METABOLIC PANEL WITH GFR
Anion gap: 10 (ref 5–15)
BUN: 5 mg/dL — ABNORMAL LOW (ref 8–23)
CO2: 25 mmol/L (ref 22–32)
Calcium: 8.9 mg/dL (ref 8.9–10.3)
Chloride: 106 mmol/L (ref 98–111)
Creatinine, Ser: <0.3 mg/dL — ABNORMAL LOW (ref 0.44–1.00)
Glucose, Bld: 110 mg/dL — ABNORMAL HIGH (ref 70–99)
Potassium: 4.2 mmol/L (ref 3.5–5.1)
Sodium: 141 mmol/L (ref 135–145)

## 2024-02-12 LAB — CBC
HCT: 35 % — ABNORMAL LOW (ref 36.0–46.0)
Hemoglobin: 10.4 g/dL — ABNORMAL LOW (ref 12.0–15.0)
MCH: 28 pg (ref 26.0–34.0)
MCHC: 29.7 g/dL — ABNORMAL LOW (ref 30.0–36.0)
MCV: 94.3 fL (ref 80.0–100.0)
Platelets: 183 10*3/uL (ref 150–400)
RBC: 3.71 MIL/uL — ABNORMAL LOW (ref 3.87–5.11)
RDW: 14.5 % (ref 11.5–15.5)
WBC: 8.4 10*3/uL (ref 4.0–10.5)
nRBC: 0 % (ref 0.0–0.2)

## 2024-02-12 LAB — SURGICAL PATHOLOGY

## 2024-02-12 MED ORDER — METRONIDAZOLE 375 MG PO CAPS
375.0000 mg | ORAL_CAPSULE | Freq: Two times a day (BID) | ORAL | 0 refills | Status: DC
  Filled 2024-02-12: qty 10, 5d supply, fill #0

## 2024-02-12 MED ORDER — TRAMADOL HCL 50 MG PO TABS
50.0000 mg | ORAL_TABLET | Freq: Four times a day (QID) | ORAL | 0 refills | Status: DC | PRN
  Filled 2024-02-12: qty 5, 2d supply, fill #0

## 2024-02-12 MED ORDER — METRONIDAZOLE 500 MG PO TABS
500.0000 mg | ORAL_TABLET | Freq: Two times a day (BID) | ORAL | 0 refills | Status: AC
  Filled 2024-02-12: qty 10, 5d supply, fill #0

## 2024-02-12 MED ORDER — CIPROFLOXACIN HCL 500 MG PO TABS
500.0000 mg | ORAL_TABLET | Freq: Two times a day (BID) | ORAL | 0 refills | Status: DC
  Filled 2024-02-12: qty 10, 5d supply, fill #0

## 2024-02-12 MED ORDER — ACETAMINOPHEN 500 MG PO TABS: 1000.0000 mg | ORAL_TABLET | Freq: Four times a day (QID) | ORAL | Status: AC | PRN

## 2024-02-12 NOTE — Plan of Care (Signed)
  Problem: Activity: Goal: Risk for activity intolerance will decrease Outcome: Progressing   Problem: Pain Managment: Goal: General experience of comfort will improve and/or be controlled Outcome: Progressing   Problem: Skin Integrity: Goal: Risk for impaired skin integrity will decrease Outcome: Progressing

## 2024-02-12 NOTE — TOC Transition Note (Signed)
 Transition of Care [facility]) - Discharge Note   Patient Details  Name: Rhonda Munoz MRN: [id number] Date of Birth: 04-02-1956  [facility]) CM/SW Contact:  Bari Leys, RN Phone Number: 02/12/2024, 10:15 AM   Clinical Narrative:   DC home with family. No TOC needs.     Final next level of care: Home/Self Care Barriers to Discharge: Continued Medical Work up   Patient Goals and CMS Choice Patient states their goals for this hospitalization and ongoing recovery are:: return home          Discharge Placement                       Discharge Plan and Services Additional resources added to the After Visit Summary for                                       Social Drivers of Health (SDOH) Interventions SDOH Screenings   Food Insecurity: No Food Insecurity (02/10/2024)  Housing: Low Risk  (02/10/2024)  Transportation Needs: No Transportation Needs (02/10/2024)  Utilities: Not At Risk (02/10/2024)  Alcohol Screen: Low Risk  (06/01/2023)  Depression (PHQ2-9): Low Risk  (11/16/2023)  Financial Resource Strain: Low Risk  (11/12/2023)  Physical Activity: Sufficiently Active (11/12/2023)  Social Connections: Moderately Integrated (02/10/2024)  Stress: No Stress Concern Present (11/12/2023)  Tobacco Use: Low Risk  (02/10/2024)  Health Literacy: Adequate Health Literacy (06/01/2023)     Readmission Risk Interventions    02/12/2024   10:15 AM  Readmission Risk Prevention Plan  Post Dischage Appt Complete  Medication Screening Complete  Transportation Screening Complete

## 2024-02-12 NOTE — Progress Notes (Signed)
 Discharge instructions given to patient and family, discharge medications delivered to patient at bedside D Rosevelt Constable RN

## 2024-02-12 NOTE — Discharge Summary (Signed)
 [facility] Discharge Summary   Patient ID: Rhonda Munoz MRN: [id number] DOB/AGE: [birth year] 68 y.o.  Admit date: 02/10/2024 Discharge date: 02/12/2024  Admitting Diagnosis: Acute appendicitis   Discharge Diagnosis Perforated appendicitis  Consultants None   Imaging: CT ABDOMEN PELVIS W CONTRAST Result Date: 02/10/2024 CLINICAL DATA:  Right lower quadrant abdominal pain nausea and vomiting starting at 4 a.m. EXAM: CT ABDOMEN AND PELVIS WITH CONTRAST TECHNIQUE: Multidetector CT imaging of the abdomen and pelvis was performed using the standard protocol following bolus administration of intravenous contrast. RADIATION DOSE REDUCTION: This exam was performed according to the departmental dose-optimization program which includes automated exposure control, adjustment of the mA and/or kV according to patient size and/or use of iterative reconstruction technique. CONTRAST:  100mL OMNIPAQUE IOHEXOL 300 MG/ML  SOLN COMPARISON:  None Available. FINDINGS: Lower chest: Unremarkable Hepatobiliary: 5 mm hypodense lesion in segment 3 of the liver on image 9 series 304 is technically too small to characterize although statistically likely to be a small benign lesions such as cyst. No further imaging workup of this lesion is indicated. Cholecystectomy. Pancreas: Unremarkable Spleen: Unremarkable Adrenals/Urinary Tract: No significant findings. Stomach/Bowel: Sigmoid colon diverticulosis. Mobile cecum the central abdomen observed with an inflamed blind-ending structure compatible with appendix in the right paracentral position at the level of the iliac crests measuring up to 2.3 cm in diameter, with a 0.9 cm hyperdensity favoring appendicolith lodged at its ostium, and periappendiceal fluid/stranding as shown on image 85 series 601. Appearance compatible with acute appendicitis. No extraluminal gas or drainable abscess. Fat deposition in the wall of the terminal ileum and cecum, usually incidental  although this can have a weak association with inflammatory bowel disease. Vascular/Lymphatic: Mild abdominal aortic atheromatous vascular calcification. Reproductive: Uterus absent. Other: No supplemental non-categorized findings. Musculoskeletal: Transitional S1 vertebra. Left greater than right degenerative facet arthropathy at L5-S1. Hemangioma eccentric to the right in the L4 vertebral body. IMPRESSION: 1. Acute appendicitis, appendiceal diameter up to 2.3 cm, with a 0.9 cm appendicolith lodged at the appendiceal ostium. 2. Mobile cecum extends towards the midline. 3. Fat deposition in the wall of the terminal ileum and cecum, usually incidental although this can have a weak association with inflammatory bowel disease. 4. Sigmoid colon diverticulosis. 5. Left greater than right degenerative facet arthropathy at L5-S1. 6. Transitional S1 vertebra. 7.  Aortic Atherosclerosis (ICD10-I70.0). Electronically Signed   By: Freida Jes M.D.   On: 02/10/2024 11:47    Procedures Dr. Aldon Hung (02/10/24) - Laparoscopic Appendectomy  Hospital Course:  Patient is a 68 year old female who presented to [facility] with abdominal pain.  Workup showed acute appendicitis.  Patient was admitted and underwent procedure listed above.  Tolerated procedure well and was transferred to the floor.  Diet was advanced as tolerated.  On POD2, the patient was voiding well, tolerating diet, ambulating well, pain well controlled, vital signs stable, incisions c/d/i and felt stable for discharge home.  Patient will follow up in our office in 3-4 weeks and knows to call with questions or concerns.    Physical Exam: General:  Alert, NAD, pleasant, comfortable Abd:  Soft, ND, mild tenderness, incisions C/D/I, mild ecchymosis around incisions  I or a member of my team have reviewed this patient in the Controlled Substance Database.   Allergies as of 02/12/2024       Reactions   Clenpiq  [sod Picosulfate-mag Ox-cit Acd] Other  (See Comments)   Sweating, vomiting and bad headache   Effexor [venlafaxine] Other (See Comments)  VISUAL HALLUCINATIONS   Codeine Nausea Only, Rash   Hydromorphone Rash   Tachycardia   Procaine Nausea Only, Rash        Medication List     TAKE these medications    acetaminophen 500 MG tablet Commonly known as: TYLENOL Take 2 tablets (1,000 mg total) by mouth every 6 (six) hours as needed for mild pain (pain score 1-3), fever or headache.   aspirin EC 81 MG tablet Take 81 mg by mouth daily.   CALCIUM 600 +D HIGH POTENCY PO Take 600 mg by mouth daily.   ciprofloxacin 500 MG tablet Commonly known as: Cipro Take 1 tablet (500 mg total) by mouth 2 (two) times daily.   DAILY MULTIVITAMIN PO Take 2 tablets by mouth daily.   desvenlafaxine  25 MG 24 hr tablet Commonly known as: PRISTIQ  TAKE 1 TABLET DAILY   Magnesium Glycinate 100 MG Caps Take 100 mg by mouth at bedtime.   metronidazole 375 MG capsule Commonly known as: Flagyl Take 1 capsule (375 mg total) by mouth 2 (two) times daily for 5 days.   pantoprazole  40 MG tablet Commonly known as: PROTONIX  TAKE 1 TABLET DAILY What changed: when to take this   pravastatin  20 MG tablet Commonly known as: PRAVACHOL  TAKE 1 TABLET EVERY EVENING   traMADol 50 MG tablet Commonly known as: ULTRAM Take 1 tablet (50 mg total) by mouth every 6 (six) hours as needed for moderate pain (pain score 4-6).          Follow-up Information     Maczis, Puja Gosai, PA-C. Go on 03/04/2024.   Specialty: General Surgery Why: 9:00 AM. Please arrive 30 min prior to appointment time to check in. Contact information: [address] [place] [address] [facility] [place] [address] [phone]                 Signed: Annetta Killian , [facility] 02/12/2024, 8:48 AM Please see Amion for pager number during day hours 7:00am-4:30pm

## 2024-02-14 ENCOUNTER — Encounter: Payer: Self-pay | Admitting: Family Medicine

## 2024-02-15 MED ORDER — FLUCONAZOLE 150 MG PO TABS: 150.0000 mg | ORAL_TABLET | Freq: Once | ORAL | 0 refills | Status: AC

## 2024-02-16 ENCOUNTER — Ambulatory Visit: Admitting: Family Medicine

## 2024-03-09 ENCOUNTER — Other Ambulatory Visit (HOSPITAL_COMMUNITY): Payer: Self-pay

## 2024-03-22 ENCOUNTER — Encounter: Payer: Self-pay | Admitting: Family Medicine

## 2024-03-22 ENCOUNTER — Ambulatory Visit (INDEPENDENT_AMBULATORY_CARE_PROVIDER_SITE_OTHER): Admitting: Family Medicine

## 2024-03-22 VITALS — BP 111/73 | HR 80 | Ht 67.0 in | Wt 188.0 lb

## 2024-03-22 DIAGNOSIS — R7303 Prediabetes: Secondary | ICD-10-CM | POA: Diagnosis not present

## 2024-03-22 DIAGNOSIS — R42 Dizziness and giddiness: Secondary | ICD-10-CM

## 2024-03-22 DIAGNOSIS — H8110 Benign paroxysmal vertigo, unspecified ear: Secondary | ICD-10-CM

## 2024-03-22 DIAGNOSIS — F324 Major depressive disorder, single episode, in partial remission: Secondary | ICD-10-CM | POA: Diagnosis not present

## 2024-03-22 LAB — POCT GLYCOSYLATED HEMOGLOBIN (HGB A1C): HbA1c, POC (controlled diabetic range): 5.8 % — AB (ref 0.0–7.0)

## 2024-03-22 NOTE — Assessment & Plan Note (Signed)
 She has done great with making dietary and lifestyle changes.  Her A1c has improved to 5.8%.  Encouraged to continue current changes.

## 2024-03-22 NOTE — Assessment & Plan Note (Signed)
No middle ear effusion.  No significant neurological deficits noted.  Referral placed to physical therapy for vestibular rehab.  We discussed that she may use over-the-counter meclizine if needed for symptom control.

## 2024-03-22 NOTE — Assessment & Plan Note (Signed)
 She remains on Pristiq.  Will plan to continue.

## 2024-03-22 NOTE — Progress Notes (Signed)
 Rhonda Munoz - 68 y.o. female MRN [id number]  Date of birth: 06/18/1956  Subjective Chief Complaint  Patient presents with   Diabetes    HPI Rhonda Munoz is a 68 y.o. female here today for follow up visit.   Since her last visit with me she had appendectomy.   She is doing well since having this completed.   She remains on pristiq .  She is doing well with this.  She has not had side effects from this at current strength.    A1c is stable at 5.8%.  She continues to follow a healthy diet with walking for exercise.   She has had a flare of vertigo again.  Started when she had her hair done recently and noted when leaning her head back and then sitting up.  Vestibular therapy worked well for her.    ROS:  A comprehensive ROS was completed and negative except as noted per HPI    Allergies  Allergen Reactions   Clenpiq  [Sod Picosulfate-Mag Ox-Cit Acd] Other (See Comments)    Sweating, vomiting and bad headache   Effexor [Venlafaxine] Other (See Comments)    VISUAL HALLUCINATIONS   Codeine Nausea Only and Rash   Hydromorphone Rash    Tachycardia   Procaine Nausea Only and Rash    Past Medical History:  Diagnosis Date   Allergy    Cataract    Depression    GERD (gastroesophageal reflux disease)    Hemorrhoids    Hyperlipidemia     Past Surgical History:  Procedure Laterality Date   ABDOMINAL HYSTERECTOMY     CATARACT EXTRACTION, BILATERAL Bilateral 08/2022   second one 09/2022   CHOLECYSTECTOMY     EYE SURGERY  2024   Cataract   FINGER SURGERY Right    index finger x 1, middle finger x 2   GALLBLADDER SURGERY     LAPAROSCOPIC APPENDECTOMY N/A 02/10/2024   Procedure: APPENDECTOMY, LAPAROSCOPIC;  Surgeon: Signe Mitzie LABOR, MD;  Location: [facility];  Service: General;  Laterality: N/A;   TONSILLECTOMY     TUBAL LIGATION      Social History   Socioeconomic History   Marital status: Widowed    Spouse name: Not on file   Number of children: 2    Years of education: 14   Highest education level: Associate degree: academic program  Occupational History   Occupation: Retired  Tobacco Use   Smoking status: Never   Smokeless tobacco: Never  Vaping Use   Vaping status: Never Used  Substance and Sexual Activity   Alcohol use: Yes    Comment: rarely   Drug use: Never   Sexual activity: Not Currently    Partners: Male    Birth control/protection: Post-menopausal, Surgical  Other Topics Concern   Not on file  Social History Narrative   Lives with her dog. She has two sons. She enjoys reading, social gatherings at church and spending time with her grandchildren. Her sons live in [place].   Social Drivers of Corporate investment banker Strain: Low Risk  (03/18/2024)   Overall Financial Resource Strain (CARDIA)    Difficulty of Paying Living Expenses: Not hard at all  Food Insecurity: No Food Insecurity (03/18/2024)   Hunger Vital Sign    Worried About Running Out of Food in the Last Year: Never true    Ran Out of Food in the Last Year: Never true  Transportation Needs: No Transportation Needs (03/18/2024)   PRAPARE - Transportation  Lack of Transportation (Medical): No    Lack of Transportation (Non-Medical): No  Physical Activity: Insufficiently Active (03/18/2024)   Exercise Vital Sign    Days of Exercise per Week: 4 days    Minutes of Exercise per Session: 30 min  Stress: No Stress Concern Present (03/18/2024)   [employer] of Occupational Health - Occupational Stress Questionnaire    Feeling of Stress: Not at all  Social Connections: Moderately Integrated (03/18/2024)   Social Connection and Isolation Panel    Frequency of Communication with Friends and Family: More than three times a week    Frequency of Social Gatherings with Friends and Family: More than three times a week    Attends Religious Services: More than 4 times per year    Active Member of [employer] or Organizations: Yes    Attends Tax inspector Meetings: More than 4 times per year    Marital Status: Widowed    Family History  Problem Relation Age of Onset   Breast cancer Mother    Hypertension Mother    Stroke Mother    Colon polyps Mother    Arthritis Mother    Asthma Mother    Cancer Mother    Hearing loss Mother    Miscarriages / Stillbirths Mother    Obesity Mother    Diabetes Father    Heart attack Father    Heart disease Father    Hypertension Father    Hyperlipidemia Father    Arthritis Father    Obesity Father    Varicose Veins Father    Cancer Sister    Colon cancer Sister    Heart failure Maternal Grandmother    Colon cancer Maternal Grandfather    Heart failure Paternal Grandmother    Cancer Sister    Obesity Sister    Esophageal cancer Neg Hx    Rectal cancer Neg Hx    Stomach cancer Neg Hx     Health Maintenance  Topic Date Due   COVID-19 Vaccine (4 - 2024-25 season) 05/03/2024 (Originally 04/26/2023)   Hepatitis C Screening  05/31/2024 (Originally 07/07/1976)   HIV Screening  05/31/2024 (Originally 07/07/1973)   INFLUENZA VACCINE  03/25/2024   Medicare Annual Wellness (AWV)  05/31/2024   DTaP/Tdap/Td (3 - Td or Tdap) 01/12/2025   MAMMOGRAM  05/19/2025   DEXA SCAN  04/21/2026   Colonoscopy  05/21/2028   Pneumococcal Vaccine: 50+ Years  Completed   Zoster Vaccines- Shingrix  Completed   Hepatitis B Vaccines  Aged Out   HPV VACCINES  Aged Out   Meningococcal B Vaccine  Aged Out     ----------------------------------------------------------------------------------------------------------------------------------------------------------------------------------------------------------------- Physical Exam BP 111/73 (BP Location: Left Arm, Patient Position: Sitting, Cuff Size: Normal)   Pulse 80   Ht 5' 7 (1.702 m)   Wt 188 lb (85.3 kg)   SpO2 100%   BMI 29.44 kg/m   Physical Exam Constitutional:      Appearance: Normal appearance.  HENT:     Head: Normocephalic and  atraumatic.  Eyes:     General: No scleral icterus. Cardiovascular:     Rate and Rhythm: Normal rate and regular rhythm.  Pulmonary:     Effort: Pulmonary effort is normal.     Breath sounds: Normal breath sounds.  Neurological:     Mental Status: She is alert.  Psychiatric:        Mood and Affect: Mood normal.        Behavior: Behavior normal.     ------------------------------------------------------------------------------------------------------------------------------------------------------------------------------------------------------------------- Assessment  and Plan  Prediabetes She has done great with making dietary and lifestyle changes.  Her A1c has improved to 5.8%.  Encouraged to continue current changes.    Depression She remains on Pristiq .  Will plan to continue.    Benign paroxysmal positional vertigo No middle ear effusion.  No significant neurological deficits noted.  Referral placed to physical therapy for vestibular rehab.  We discussed that she may use over-the-counter meclizine if needed for symptom control.   No orders of the defined types were placed in this encounter.   No follow-ups on file.

## 2024-03-23 ENCOUNTER — Ambulatory Visit: Admitting: Physical Therapy

## 2024-03-25 ENCOUNTER — Encounter: Payer: Self-pay | Admitting: Rehabilitative and Restorative Service Providers"

## 2024-03-25 ENCOUNTER — Ambulatory Visit: Attending: Family Medicine | Admitting: Rehabilitative and Restorative Service Providers"

## 2024-03-25 ENCOUNTER — Other Ambulatory Visit: Payer: Self-pay

## 2024-03-25 DIAGNOSIS — H8113 Benign paroxysmal vertigo, bilateral: Secondary | ICD-10-CM | POA: Insufficient documentation

## 2024-03-25 DIAGNOSIS — H8112 Benign paroxysmal vertigo, left ear: Secondary | ICD-10-CM

## 2024-03-25 DIAGNOSIS — R42 Dizziness and giddiness: Secondary | ICD-10-CM

## 2024-03-25 DIAGNOSIS — H8111 Benign paroxysmal vertigo, right ear: Secondary | ICD-10-CM

## 2024-03-25 NOTE — Therapy (Signed)
 OUTPATIENT PHYSICAL THERAPY VESTIBULAR EVALUATION   Patient Name: Rhonda Munoz MRN: [id number] DOB:June 17, 1956, 68 y.o., female Today's Date: 03/25/2024  END OF SESSION:  PT End of Session - 03/25/24 1020     Visit Number 1    Number of Visits 8    Date for PT Re-Evaluation 04/24/24    Authorization Type medicare and tricare    PT Start Time 1018    PT Stop Time 1056    PT Time Calculation (min) 38 min    Activity Tolerance Patient tolerated treatment well    Behavior During Therapy WFL for tasks assessed/performed          Past Medical History:  Diagnosis Date   Allergy    Cataract    Depression    GERD (gastroesophageal reflux disease)    Hemorrhoids    Hyperlipidemia    Past Surgical History:  Procedure Laterality Date   ABDOMINAL HYSTERECTOMY     CATARACT EXTRACTION, BILATERAL Bilateral 08/2022   second one 09/2022   CHOLECYSTECTOMY     EYE SURGERY  2024   Cataract   FINGER SURGERY Right    index finger x 1, middle finger x 2   GALLBLADDER SURGERY     LAPAROSCOPIC APPENDECTOMY N/A 02/10/2024   Procedure: APPENDECTOMY, LAPAROSCOPIC;  Surgeon: Signe Mitzie LABOR, MD;  Location: [facility];  Service: General;  Laterality: N/A;   TONSILLECTOMY     TUBAL LIGATION     Patient Active Problem List   Diagnosis Date Noted   Perforated appendicitis 02/12/2024   Acute appendicitis 02/10/2024   Lymphadenopathy 08/19/2023   Vulvovaginal candidiasis 07/27/2023   Thrush 07/27/2023   Cough 07/27/2023   Acute sinusitis 07/15/2023   Benign paroxysmal positional vertigo 01/04/2023   Yeast dermatitis 07/02/2022   Depression 06/05/2021   Osteopenia 04/26/2020   Hypercalcemia 01/02/2020   Hyperlipemia 01/02/2020   Prediabetes 01/02/2020   Postmenopausal 12/13/2019   Knee pain, left 09/28/2017   Gastroesophageal reflux disease with esophagitis 05/18/2017   History of colon polyps 05/18/2017    PCP: Velma Ku, DO REFERRING PROVIDER: Velma Ku,  DO REFERRING DIAG: R42 (ICD-10-CM) - Vertigo   THERAPY DIAG:  Dizziness and giddiness  BPPV (benign paroxysmal positional vertigo), left  BPPV (benign paroxysmal positional vertigo), right  ONSET DATE: 03/22/24  Rationale for Evaluation and Treatment: Rehabilitation  SUBJECTIVE:   SUBJECTIVE STATEMENT: The patient reports vertigo returned 2 weeks ago while at her hair appointment when leaning back to have hair washed. She gets symptoms when rolling R and L. She feels dizziness when getting clothes out of the dryer, rolling in bed, and doing quick turns.  Pt accompanied by: self  PERTINENT HISTORY: recent appendectomy, hyperlipidemia  PAIN:  Are you having pain? No  PRECAUTIONS: None  WEIGHT BEARING RESTRICTIONS: No  FALLS: Has patient fallen in last 6 months? No  LIVING ENVIRONMENT: Lives with: lives alone Lives in: House/apartment  PLOF: Independent  PATIENT GOALS: reduce dizziness  OBJECTIVE:  Note: Objective measures were completed at Evaluation unless otherwise noted.  COGNITION: Overall cognitive status: Within functional limits for tasks assessed   SENSATION: WFL  POSTURE:  No Significant postural limitations  Cervical ROM:  WFLs  GAIT: Gait pattern: WFL Distance walked: 75 feet  PATIENT SURVEYS:  DHI: THE DIZZINESS HANDICAP INVENTORY (DHI)  P1. Does looking up increase your problem? 2 = Sometimes  E2. Because of your problem, do you feel frustrated? 4 = Yes  F3. Because of your problem, do you restrict your travel  for business or recreation?  0 = No  P4. Does walking down the aisle of a supermarket increase your problems?  0 = No  F5. Because of your problem, do you have difficulty getting into or out of bed?  4 = Yes  F6. Does your problem significantly restrict your participation in social activities, such as going out to dinner, going to the movies, dancing, or going to parties? 0 = No  F7. Because of your problem, do you have difficulty  reading?  4 = Yes  P8. Does performing more ambitious activities such as sports, dancing, household chores (sweeping or putting dishes away) increase your problems?  2 = Sometimes  E9. Because of your problem, are you afraid to leave your home without having without having someone accompany you?  0 = No  E10. Because of your problem have you been embarrassed in front of others?  0 = No  P11. Do quick movements of your head increase your problem?  4 = Yes  F12. Because of your problem, do you avoid heights?  4 = Yes  P13. Does turning over in bed increase your problem?  4 = Yes  F14. Because of your problem, is it difficult for you to do strenuous homework or yard work? 2 = Sometimes  E15. Because of your problem, are you afraid people may think you are intoxicated? 0 = No  F16. Because of your problem, is it difficult for you to go for a walk by yourself?  0 = No  P17. Does walking down a sidewalk increase your problem?  0 = No  E18.Because of your problem, is it difficult for you to concentrate 0 = No  F19. Because of your problem, is it difficult for you to walk around your house in the dark? 2 = Sometimes  E20. Because of your problem, are you afraid to stay home alone?  0 = No  E21. Because of your problem, do you feel handicapped? 0 = No  E22. Has the problem placed stress on your relationships with members of your family or friends? 0 = No  E23. Because of your problem, are you depressed?  0 = No  F24. Does your problem interfere with your job or household responsibilities?  2 = Sometimes  P25. Does bending over increase your problem?  4 = Yes  TOTAL 40%    DHI Scoring Instructions  The patient is asked to answer each question as it pertains to dizziness or unsteadiness problems, specifically  considering their condition during the last month. Questions are designed to incorporate functional (F), physical  (P), and emotional (E) impacts on disability.   Scores greater than 10 points  should be referred to balance specialists for further evaluation.   16-34 Points (mild handicap)  36-52 Points (moderate handicap)  54+ Points (severe handicap)  Minimally Detectable Change: 17 points ([address] [place], 1990)  [place], G. SHAUNNA. and [place], C. W. (1990). The development of the Dizziness Handicap Inventory. Archives of Otolaryngology - Head and Neck Surgery 116(4): [id number].   VESTIBULAR ASSESSMENT:  GENERAL OBSERVATION: The patient walks into clinic independently.   SYMPTOM BEHAVIOR:  Subjective history: h/o recurring vertigo, h/o multi-canal   Non-Vestibular symptoms: h/o visual reliance-- sensitive to ceiling fans or busy environments  Type of dizziness: Spinning/Vertigo and Unsteady with head/body turns  Frequency: daily  Duration: seconds to minutes  Aggravating factors: rolling R and L, bending forward, looking up  Relieving factors: head stationary  Progression of  symptoms: better  OCULOMOTOR EXAM:  Ocular Alignment: normal  Ocular ROM: No Limitations  Spontaneous Nystagmus: absent  Gaze-Induced Nystagmus: absent  Smooth Pursuits: intact and sensation of unbalanced in head  Saccades: intact and no symptoms of imbalance with quick eye movements   VESTIBULAR - OCULAR REFLEX:   Slow VOR: Normal and Comment: sensation of eyes/head needing to catch up after stopping *did not test head impulse due to guarding (and therefore, we are prescribing VOR as part of HEP regardless of result of head impulse testing    POSITIONAL TESTING: Right Dix-Hallpike: no nystagmus, however immediate sensation of spinning that lasts x 30 seconds Left Dix-Hallpike: upbeating, left nystagmus after a 12 second latency, then lasted for 45-60 seconds (so may be cupulolithiasis versus canalithiasis) Right Roll Test: no nystagmus Left Roll Test: apogeotropic nystagmus viewed greater using Alexander's law (in room light) TESTING INDICATES L posterior canalithiasis (time of nystagmus may  be more indicative of cupulolithiasis) and R horizontal cupulolithiasis  FUNCTIONAL GAIT: TBA as indicated    [facility] Adult PT Treatment:                                                DATE: 03/25/24  Canalith Repositioning:  Comment: Kim maneuver for R cupulolithiasis x 1 rep. Provided vibration to mastoid during recommended positions Self: Discussed nature of condition  PATIENT EDUCATION: Education details: nature of symptoms Person educated: Patient Education method: Explanation Education comprehension: verbalized understanding  HOME EXERCISE PROGRAM:  GOALS: Goals reviewed with patient? Yes  SHORT TERM GOALS: Target date: 04/24/24  The patient will be indep with initial HEP Baseline: initiated at eval Goal status: INITIAL  2..   The patient will report no dizziness with bed mobility.  Baseline: Dizziness with rolling Goal status: INITIAL  LONG TERM GOALS: Target date: 05/24/24  The patient will be indep with progression of HEP. Baseline:  initiated at eval Goal status: INITIAL  2.  The patient will improve DHI by 15% to demonstrate improved functional abilities. Baseline: 40% Goal status: INITIAL  3.  The patient will have negative positional testing indicating resolution of BPPV. Baseline:  see above Goal status: INITIAL  4.  The patient will report self mgmt of BPPV due to chronic h/o vertigo. Baseline:   has h/o episodic vertigo Goal status: INITIAL  ASSESSMENT:  CLINICAL IMPRESSION: Patient is a 68 y.o. female who was seen today for physical therapy evaluation and treatment for vertigo. She presents today with imapairments of decreased VOR (per symptoms with slow VOR), and multi-canal BPPV. PT initiated treatment today for R cupulolithiasis. She experienced severe spinning and nausea with return to sitting. Symptoms settled after resting x 5 minutes. PT to address deficits to return to baseline level of function.  OBJECTIVE IMPAIRMENTS: decreased activity  tolerance, decreased balance, dizziness, and impaired vision/preception.   ACTIVITY LIMITATIONS: bending, bed mobility, and locomotion level  PARTICIPATION LIMITATIONS: cleaning, laundry, and community activity  PERSONAL FACTORS: 1-2 comorbidities: recent appendectomy, h/o vertigo are also affecting patient's functional outcome.   REHAB POTENTIAL: Good  CLINICAL DECISION MAKING: Evolving/moderate complexity  EVALUATION COMPLEXITY: Moderate  PLAN:  PT FREQUENCY: 1-2x/week  PT DURATION: 8 weeks  PLANNED INTERVENTIONS: [id number]- PT Re-evaluation, 97750- Physical Performance Testing, 97110-Therapeutic exercises, 97530- Therapeutic activity, [id number]- Neuromuscular re-education, 97535- Self Care, [id number]- Manual therapy, [id number]- Gait training, [phone]- Canalith repositioning, Patient/Family education, Balance training,  and Visual/preceptual remediation/compensation  PLAN FOR NEXT SESSION: Plan to recheck positional maneuvers, needs gaze adaptation x 1 viewing. Discuss home management and habituation if needed.    Wateen Varon, PT 03/25/2024, 11:31 AM

## 2024-03-28 ENCOUNTER — Ambulatory Visit: Admitting: Rehabilitative and Restorative Service Providers"

## 2024-03-28 ENCOUNTER — Encounter: Payer: Self-pay | Admitting: Rehabilitative and Restorative Service Providers"

## 2024-03-28 DIAGNOSIS — H8112 Benign paroxysmal vertigo, left ear: Secondary | ICD-10-CM

## 2024-03-28 DIAGNOSIS — H8113 Benign paroxysmal vertigo, bilateral: Secondary | ICD-10-CM | POA: Diagnosis not present

## 2024-03-28 DIAGNOSIS — H8111 Benign paroxysmal vertigo, right ear: Secondary | ICD-10-CM

## 2024-03-28 DIAGNOSIS — R42 Dizziness and giddiness: Secondary | ICD-10-CM

## 2024-03-28 NOTE — Therapy (Signed)
 OUTPATIENT PHYSICAL THERAPY VESTIBULAR TREATMENT   Patient Name: Rhonda Munoz MRN: [id number] DOB:[birth year], 68 y.o., female Today's Date: 03/28/2024  END OF SESSION:  PT End of Session - 03/28/24 1447     Visit Number 2    Number of Visits 8    Date for PT Re-Evaluation 04/24/24    Authorization Type medicare and tricare    PT Start Time 1447    PT Stop Time 1530    PT Time Calculation (min) 43 min    Activity Tolerance Patient tolerated treatment well    Behavior During Therapy WFL for tasks assessed/performed           Past Medical History:  Diagnosis Date   Allergy    Cataract    Depression    GERD (gastroesophageal reflux disease)    Hemorrhoids    Hyperlipidemia    Past Surgical History:  Procedure Laterality Date   ABDOMINAL HYSTERECTOMY     CATARACT EXTRACTION, BILATERAL Bilateral 08/2022   second one 09/2022   CHOLECYSTECTOMY     EYE SURGERY  2024   Cataract   FINGER SURGERY Right    index finger x 1, middle finger x 2   GALLBLADDER SURGERY     LAPAROSCOPIC APPENDECTOMY N/A 02/10/2024   Procedure: APPENDECTOMY, LAPAROSCOPIC;  Surgeon: Signe Mitzie LABOR, MD;  Location: [facility];  Service: General;  Laterality: N/A;   TONSILLECTOMY     TUBAL LIGATION     Patient Active Problem List   Diagnosis Date Noted   Perforated appendicitis 02/12/2024   Acute appendicitis 02/10/2024   Lymphadenopathy 08/19/2023   Vulvovaginal candidiasis 07/27/2023   Thrush 07/27/2023   Cough 07/27/2023   Acute sinusitis 07/15/2023   Benign paroxysmal positional vertigo 01/04/2023   Yeast dermatitis 07/02/2022   Depression 06/05/2021   Osteopenia 04/26/2020   Hypercalcemia 01/02/2020   Hyperlipemia 01/02/2020   Prediabetes 01/02/2020   Postmenopausal 12/13/2019   Knee pain, left 09/28/2017   Gastroesophageal reflux disease with esophagitis 05/18/2017   History of colon polyps 05/18/2017    PCP: Velma Ku, DO REFERRING PROVIDER: Velma Ku,  DO REFERRING DIAG: R42 (ICD-10-CM) - Vertigo   THERAPY DIAG:  Dizziness and giddiness  BPPV (benign paroxysmal positional vertigo), left  BPPV (benign paroxysmal positional vertigo), right  ONSET DATE: 03/22/24  Rationale for Evaluation and Treatment: Rehabilitation  SUBJECTIVE:   SUBJECTIVE STATEMENT: Symptoms feel worse today than they were last week. She is noticing difficulty reaching into low cabinets. She feels like bending down, looking up increase symptoms.   EVAL: The patient reports vertigo returned 2 weeks ago while at her hair appointment when leaning back to have hair washed. She gets symptoms when rolling R and L. She feels dizziness when getting clothes out of the dryer, rolling in bed, and doing quick turns.  Pt accompanied by: self  PERTINENT HISTORY: recent appendectomy, hyperlipidemia  PAIN:  Are you having pain? No  PRECAUTIONS: None  WEIGHT BEARING RESTRICTIONS: No  FALLS: Has patient fallen in last 6 months? No  PATIENT GOALS: reduce dizziness  OBJECTIVE:  Note: Objective measures were completed at Evaluation unless otherwise noted. PATIENT SURVEYS:  DHI: THE DIZZINESS HANDICAP INVENTORY (DHI) P1. Does looking up increase your problem? 2 = Sometimes  E2. Because of your problem, do you feel frustrated? 4 = Yes  F3. Because of your problem, do you restrict your travel for business or recreation?  0 = No  P4. Does walking down the aisle of a supermarket increase your problems?  0 = No  F5. Because of your problem, do you have difficulty getting into or out of bed?  4 = Yes  F6. Does your problem significantly restrict your participation in social activities, such as going out to dinner, going to the movies, dancing, or going to parties? 0 = No  F7. Because of your problem, do you have difficulty reading?  4 = Yes  P8. Does performing more ambitious activities such as sports, dancing, household chores (sweeping or putting dishes away) increase your  problems?  2 = Sometimes  E9. Because of your problem, are you afraid to leave your home without having without having someone accompany you?  0 = No  E10. Because of your problem have you been embarrassed in front of others?  0 = No  P11. Do quick movements of your head increase your problem?  4 = Yes  F12. Because of your problem, do you avoid heights?  4 = Yes  P13. Does turning over in bed increase your problem?  4 = Yes  F14. Because of your problem, is it difficult for you to do strenuous homework or yard work? 2 = Sometimes  E15. Because of your problem, are you afraid people may think you are intoxicated? 0 = No  F16. Because of your problem, is it difficult for you to go for a walk by yourself?  0 = No  P17. Does walking down a sidewalk increase your problem?  0 = No  E18.Because of your problem, is it difficult for you to concentrate 0 = No  F19. Because of your problem, is it difficult for you to walk around your house in the dark? 2 = Sometimes  E20. Because of your problem, are you afraid to stay home alone?  0 = No  E21. Because of your problem, do you feel handicapped? 0 = No  E22. Has the problem placed stress on your relationships with members of your family or friends? 0 = No  E23. Because of your problem, are you depressed?  0 = No  F24. Does your problem interfere with your job or household responsibilities?  2 = Sometimes  P25. Does bending over increase your problem?  4 = Yes  TOTAL 40%   VESTIBULAR ASSESSMENT: GENERAL OBSERVATION: The patient walks into clinic independently.   SYMPTOM BEHAVIOR:  Subjective history: h/o recurring vertigo, h/o multi-canal   Non-Vestibular symptoms: h/o visual reliance-- sensitive to ceiling fans or busy environments  Type of dizziness: Spinning/Vertigo and Unsteady with head/body turns  Frequency: daily  Duration: seconds to minutes  Aggravating factors: rolling R and L, bending forward, looking up  Relieving factors: head  stationary  Progression of symptoms: better  OCULOMOTOR EXAM:  Ocular Alignment: normal  Ocular ROM: No Limitations  Spontaneous Nystagmus: absent  Gaze-Induced Nystagmus: absent  Smooth Pursuits: intact and sensation of unbalanced in head  Saccades: intact and no symptoms of imbalance with quick eye movements   VESTIBULAR - OCULAR REFLEX:   Slow VOR: Normal and Comment: sensation of eyes/head needing to catch up after stopping *did not test head impulse due to guarding (and therefore, we are prescribing VOR as part of HEP regardless of result of head impulse testing    POSITIONAL TESTING: Right Dix-Hallpike: no nystagmus, however immediate sensation of spinning that lasts x 30 seconds Left Dix-Hallpike: upbeating, left nystagmus after a 12 second latency, then lasted for 45-60 seconds (so may be cupulolithiasis versus canalithiasis) Right Roll Test: no nystagmus Left Roll Test:  apogeotropic nystagmus viewed greater using Alexander's law (in room light) TESTING INDICATES L posterior canalithiasis (time of nystagmus may be more indicative of cupulolithiasis) and R horizontal cupulolithiasis  FUNCTIONAL GAIT: TBA as indicated  [facility] Adult PT Treatment:                                                DATE: 03/28/24 Neuromuscular re-ed: Positional testing:   R dix hallpike: no dizziness nystagmus L dix hallpike: trace nystagmus (cleared quickly-- not able to differentiate) R horizontal roll: trace sensation of dizziness, no nystagmus viewed in room light L horizontal roll: trace sensation of dizziness, no nystagmus  Habituation Horizontal rolling R and L sides x 5 reps Seated bend forward and return to upright x 3 reps-- produces nausea on 3rd repetition Head nods provided for HEP    [facility] Adult PT Treatment:                                                DATE: 03/25/24  Canalith Repositioning:  Comment: Kim maneuver for R cupulolithiasis x 1 rep. Provided vibration to mastoid during  recommended positions Self: Discussed nature of condition  PATIENT EDUCATION: Education details: nature of symptoms Person educated: Patient Education method: Explanation Education comprehension: verbalized understanding  HOME EXERCISE PROGRAM: Access Code: [email or web address] URL: [email or web address] Date: 03/28/2024 Prepared by: Tawni Ferrier  Exercises - Rolling rightleft sides for vestibular habituation  - 2 x daily - 7 x weekly - 1 sets - 5 reps - Seated Head Nod Vestibular Habituation  - 2 x daily - 7 x weekly - 1 sets - 5 reps  GOALS: Goals reviewed with patient? Yes  SHORT TERM GOALS: Target date: 04/24/24  The patient will be indep with initial HEP Baseline: initiated at eval Goal status: INITIAL  2..   The patient will report no dizziness with bed mobility.  Baseline: Dizziness with rolling Goal status: INITIAL  LONG TERM GOALS: Target date: 05/24/24  The patient will be indep with progression of HEP. Baseline:  initiated at eval Goal status: INITIAL  2.  The patient will improve DHI by 15% to demonstrate improved functional abilities. Baseline: 40% Goal status: INITIAL  3.  The patient will have negative positional testing indicating resolution of BPPV. Baseline:  see above Goal status: INITIAL  4.  The patient will report self mgmt of BPPV due to chronic h/o vertigo. Baseline:   has h/o episodic vertigo Goal status: INITIAL  ASSESSMENT:  CLINICAL IMPRESSION: Positional testing was inconclusive today with no nystagmus viewed in room light, but some dizziness in multiple positions. PT initiated habituation HEP and patient had reduction of symptoms with repetition. Patient requires frequent rests to reduce nausea.  EVAL:Patient is a 68 y.o. female who was seen today for physical therapy evaluation and treatment for vertigo. She presents today with imapairments of decreased VOR (per symptoms with slow VOR), and multi-canal BPPV. PT initiated  treatment today for R cupulolithiasis. She experienced severe spinning and nausea with return to sitting. Symptoms settled after resting x 5 minutes. PT to address deficits to return to baseline level of function.  OBJECTIVE IMPAIRMENTS: decreased activity tolerance, decreased balance, dizziness, and impaired vision/preception.   PLAN:  PT FREQUENCY: 1-2x/week  PT DURATION: 8 weeks  PLANNED INTERVENTIONS: 97164- PT Re-evaluation, 97750- Physical Performance Testing, 97110-Therapeutic exercises, 97530- Therapeutic activity, [id number]- Neuromuscular re-education, 97535- Self Care, [id number]- Manual therapy, [phone]- Gait training, [phone]- Canalith repositioning, Patient/Family education, Balance training, and Visual/preceptual remediation/compensation  PLAN FOR NEXT SESSION: Plan to recheck positional maneuvers, needs gaze adaptation x 1 viewing. Discuss home management and habituation if needed.     Claudean Leavelle, PT 03/28/2024, 2:48 PM

## 2024-03-30 ENCOUNTER — Encounter: Payer: Self-pay | Admitting: Rehabilitative and Restorative Service Providers"

## 2024-03-30 ENCOUNTER — Ambulatory Visit: Admitting: Rehabilitative and Restorative Service Providers"

## 2024-03-30 DIAGNOSIS — H8113 Benign paroxysmal vertigo, bilateral: Secondary | ICD-10-CM | POA: Diagnosis not present

## 2024-03-30 DIAGNOSIS — H8111 Benign paroxysmal vertigo, right ear: Secondary | ICD-10-CM

## 2024-03-30 DIAGNOSIS — H8112 Benign paroxysmal vertigo, left ear: Secondary | ICD-10-CM

## 2024-03-30 DIAGNOSIS — R42 Dizziness and giddiness: Secondary | ICD-10-CM

## 2024-03-30 NOTE — Therapy (Signed)
 OUTPATIENT PHYSICAL THERAPY VESTIBULAR TREATMENT   Patient Name: Rhonda Munoz MRN: [id number] DOB:March 20, 1956, 68 y.o., female Today's Date: 03/30/2024  END OF SESSION:  PT End of Session - 03/30/24 1405     Visit Number 3    Number of Visits 8    Date for PT Re-Evaluation 04/24/24    Authorization Type medicare and tricare    PT Start Time 1405    PT Stop Time 1445    PT Time Calculation (min) 40 min    Activity Tolerance Patient tolerated treatment well    Behavior During Therapy WFL for tasks assessed/performed         Past Medical History:  Diagnosis Date   Allergy    Cataract    Depression    GERD (gastroesophageal reflux disease)    Hemorrhoids    Hyperlipidemia    Past Surgical History:  Procedure Laterality Date   ABDOMINAL HYSTERECTOMY     CATARACT EXTRACTION, BILATERAL Bilateral 08/2022   second one 09/2022   CHOLECYSTECTOMY     EYE SURGERY  2024   Cataract   FINGER SURGERY Right    index finger x 1, middle finger x 2   GALLBLADDER SURGERY     LAPAROSCOPIC APPENDECTOMY N/A 02/10/2024   Procedure: APPENDECTOMY, LAPAROSCOPIC;  Surgeon: Signe Mitzie LABOR, MD;  Location: [facility];  Service: General;  Laterality: N/A;   TONSILLECTOMY     TUBAL LIGATION     Patient Active Problem List   Diagnosis Date Noted   Perforated appendicitis 02/12/2024   Acute appendicitis 02/10/2024   Lymphadenopathy 08/19/2023   Vulvovaginal candidiasis 07/27/2023   Thrush 07/27/2023   Cough 07/27/2023   Acute sinusitis 07/15/2023   Benign paroxysmal positional vertigo 01/04/2023   Yeast dermatitis 07/02/2022   Depression 06/05/2021   Osteopenia 04/26/2020   Hypercalcemia 01/02/2020   Hyperlipemia 01/02/2020   Prediabetes 01/02/2020   Postmenopausal 12/13/2019   Knee pain, left 09/28/2017   Gastroesophageal reflux disease with esophagitis 05/18/2017   History of colon polyps 05/18/2017    PCP: Velma Ku, DO REFERRING PROVIDER: Velma Ku, DO REFERRING  DIAG: R42 (ICD-10-CM) - Vertigo   THERAPY DIAG:  Dizziness and giddiness  BPPV (benign paroxysmal positional vertigo), left  BPPV (benign paroxysmal positional vertigo), right  ONSET DATE: 03/22/24  Rationale for Evaluation and Treatment: Rehabilitation  SUBJECTIVE:   SUBJECTIVE STATEMENT: The patient reports she was feeling better Monday and yesterday, but she has dizziness today. She has episodic symptoms when bending forward described as a waviness/ unsteadiness.  EVAL: The patient reports vertigo returned 2 weeks ago while at her hair appointment when leaning back to have hair washed. She gets symptoms when rolling R and L. She feels dizziness when getting clothes out of the dryer, rolling in bed, and doing quick turns.  Pt accompanied by: self  PERTINENT HISTORY: recent appendectomy, hyperlipidemia  PAIN:  Are you having pain? No  PRECAUTIONS: None  WEIGHT BEARING RESTRICTIONS: No  FALLS: Has patient fallen in last 6 months? No  PATIENT GOALS: reduce dizziness  OBJECTIVE:  Note: Objective measures were completed at Evaluation unless otherwise noted. PATIENT SURVEYS:  DHI: THE DIZZINESS HANDICAP INVENTORY (DHI) P1. Does looking up increase your problem? 2 = Sometimes  E2. Because of your problem, do you feel frustrated? 4 = Yes  F3. Because of your problem, do you restrict your travel for business or recreation?  0 = No  P4. Does walking down the aisle of a supermarket increase your problems?  0 =  No  F5. Because of your problem, do you have difficulty getting into or out of bed?  4 = Yes  F6. Does your problem significantly restrict your participation in social activities, such as going out to dinner, going to the movies, dancing, or going to parties? 0 = No  F7. Because of your problem, do you have difficulty reading?  4 = Yes  P8. Does performing more ambitious activities such as sports, dancing, household chores (sweeping or putting dishes away) increase your  problems?  2 = Sometimes  E9. Because of your problem, are you afraid to leave your home without having without having someone accompany you?  0 = No  E10. Because of your problem have you been embarrassed in front of others?  0 = No  P11. Do quick movements of your head increase your problem?  4 = Yes  F12. Because of your problem, do you avoid heights?  4 = Yes  P13. Does turning over in bed increase your problem?  4 = Yes  F14. Because of your problem, is it difficult for you to do strenuous homework or yard work? 2 = Sometimes  E15. Because of your problem, are you afraid people may think you are intoxicated? 0 = No  F16. Because of your problem, is it difficult for you to go for a walk by yourself?  0 = No  P17. Does walking down a sidewalk increase your problem?  0 = No  E18.Because of your problem, is it difficult for you to concentrate 0 = No  F19. Because of your problem, is it difficult for you to walk around your house in the dark? 2 = Sometimes  E20. Because of your problem, are you afraid to stay home alone?  0 = No  E21. Because of your problem, do you feel handicapped? 0 = No  E22. Has the problem placed stress on your relationships with members of your family or friends? 0 = No  E23. Because of your problem, are you depressed?  0 = No  F24. Does your problem interfere with your job or household responsibilities?  2 = Sometimes  P25. Does bending over increase your problem?  4 = Yes  TOTAL 40%   VESTIBULAR ASSESSMENT: GENERAL OBSERVATION: The patient walks into clinic independently.   SYMPTOM BEHAVIOR:  Subjective history: h/o recurring vertigo, h/o multi-canal   Non-Vestibular symptoms: h/o visual reliance-- sensitive to ceiling fans or busy environments  Type of dizziness: Spinning/Vertigo and Unsteady with head/body turns  Frequency: daily  Duration: seconds to minutes  Aggravating factors: rolling R and L, bending forward, looking up  Relieving factors: head  stationary  Progression of symptoms: better  OCULOMOTOR EXAM:  Ocular Alignment: normal  Ocular ROM: No Limitations  Spontaneous Nystagmus: absent  Gaze-Induced Nystagmus: absent  Smooth Pursuits: intact and sensation of unbalanced in head  Saccades: intact and no symptoms of imbalance with quick eye movements   VESTIBULAR - OCULAR REFLEX:   Slow VOR: Normal and Comment: sensation of eyes/head needing to catch up after stopping *did not test head impulse due to guarding (and therefore, we are prescribing VOR as part of HEP regardless of result of head impulse testing    POSITIONAL TESTING: Right Dix-Hallpike: no nystagmus, however immediate sensation of spinning that lasts x 30 seconds Left Dix-Hallpike: upbeating, left nystagmus after a 12 second latency, then lasted for 45-60 seconds (so may be cupulolithiasis versus canalithiasis) Right Roll Test: no nystagmus Left Roll Test: apogeotropic nystagmus  viewed greater using Alexander's law (in room light) TESTING INDICATES L posterior canalithiasis (time of nystagmus may be more indicative of cupulolithiasis) and R horizontal cupulolithiasis  FUNCTIONAL GAIT: TBA as indicated   [facility] Adult PT Treatment:                                                DATE: 03/30/24 Neuromuscular re-ed: Positional testing R dix hallpike trace x 1-2 seconds-- could not differentiate direction L dix hallpike none R horizontal roll none L horizontal roll none Gaze adaptation X 1 viewing horizontal seated X 1 viewing vertical seated Could tolerate 10 reps of each x 2 sets  Habituation Brandt daroff x 3 repetitions with rest in between   [facility] Adult PT Treatment:                                                DATE: 03/28/24 Neuromuscular re-ed: Positional testing:   R dix hallpike: no dizziness nystagmus L dix hallpike: trace nystagmus (cleared quickly-- not able to differentiate) R horizontal roll: trace sensation of dizziness, no nystagmus viewed in  room light L horizontal roll: trace sensation of dizziness, no nystagmus  Habituation Horizontal rolling R and L sides x 5 reps Seated bend forward and return to upright x 3 reps-- produces nausea on 3rd repetition Head nods provided for HEP    [facility] Adult PT Treatment:                                                DATE: 03/25/24  Canalith Repositioning:  Comment: Kim maneuver for R cupulolithiasis x 1 rep. Provided vibration to mastoid during recommended positions Self: Discussed nature of condition  PATIENT EDUCATION: Education details: nature of symptoms Person educated: Patient Education method: Explanation Education comprehension: verbalized understanding  HOME EXERCISE PROGRAM: Access Code: [email or web address] URL: [email or web address] Date: 03/28/2024 Prepared by: Tawni Ferrier  Exercises - Rolling rightleft sides for vestibular habituation  - 2 x daily - 7 x weekly - 1 sets - 5 reps - Seated Head Nod Vestibular Habituation  - 2 x daily - 7 x weekly - 1 sets - 5 reps  GOALS: Goals reviewed with patient? Yes  SHORT TERM GOALS: Target date: 04/24/24  The patient will be indep with initial HEP Baseline: initiated at eval Goal status: INITIAL  2..   The patient will report no dizziness with bed mobility.  Baseline: Dizziness with rolling Goal status: INITIAL  LONG TERM GOALS: Target date: 05/24/24  The patient will be indep with progression of HEP. Baseline:  initiated at eval Goal status: INITIAL  2.  The patient will improve DHI by 15% to demonstrate improved functional abilities. Baseline: 40% Goal status: INITIAL  3.  The patient will have negative positional testing indicating resolution of BPPV. Baseline:  see above Goal status: INITIAL  4.  The patient will report self mgmt of BPPV due to chronic h/o vertigo. Baseline:   has h/o episodic vertigo Goal status: INITIAL  ASSESSMENT:  CLINICAL IMPRESSION: Positional testing provokes trace  nystagmus with symptoms worse with return to sitting. Plan to  continue to progress to STGs/LTGs -- we added gaze and brandt daroff habituation exercises to HEP.   EVAL:Patient is a 68 y.o. female who was seen today for physical therapy evaluation and treatment for vertigo. She presents today with imapairments of decreased VOR (per symptoms with slow VOR), and multi-canal BPPV. PT initiated treatment today for R cupulolithiasis. She experienced severe spinning and nausea with return to sitting. Symptoms settled after resting x 5 minutes. PT to address deficits to return to baseline level of function.  OBJECTIVE IMPAIRMENTS: decreased activity tolerance, decreased balance, dizziness, and impaired vision/preception.   PLAN:  PT FREQUENCY: 1-2x/week  PT DURATION: 8 weeks  PLANNED INTERVENTIONS: 97164- PT Re-evaluation, 97750- Physical Performance Testing, 97110-Therapeutic exercises, 97530- Therapeutic activity, [id number]- Neuromuscular re-education, 97535- Self Care, 02859- Manual therapy, [phone]- Gait training, [phone]- Canalith repositioning, Patient/Family education, Balance training, and Visual/preceptual remediation/compensation  PLAN FOR NEXT SESSION: Plan to recheck positional maneuvers, needs gaze adaptation x 1 viewing. Discuss home management and habituation if needed.     Ayaat Jansma, PT 03/30/2024, 2:05 PM

## 2024-04-04 ENCOUNTER — Encounter: Payer: Self-pay | Admitting: Rehabilitative and Restorative Service Providers"

## 2024-04-04 ENCOUNTER — Ambulatory Visit: Admitting: Rehabilitative and Restorative Service Providers"

## 2024-04-04 DIAGNOSIS — R42 Dizziness and giddiness: Secondary | ICD-10-CM

## 2024-04-04 DIAGNOSIS — H8113 Benign paroxysmal vertigo, bilateral: Secondary | ICD-10-CM | POA: Diagnosis not present

## 2024-04-04 DIAGNOSIS — H8112 Benign paroxysmal vertigo, left ear: Secondary | ICD-10-CM

## 2024-04-04 DIAGNOSIS — H8111 Benign paroxysmal vertigo, right ear: Secondary | ICD-10-CM

## 2024-04-04 NOTE — Therapy (Signed)
 OUTPATIENT PHYSICAL THERAPY VESTIBULAR TREATMENT   Patient Name: Rhonda Munoz MRN: [id number] DOB:09/05/1955, 68 y.o., female Today's Date: 04/04/2024  END OF SESSION:  PT End of Session - 04/04/24 1449     Visit Number 4    Number of Visits 8    Date for PT Re-Evaluation 04/24/24    Authorization Type medicare and tricare    PT Start Time 1447    PT Stop Time 1525    PT Time Calculation (min) 38 min    Activity Tolerance Patient tolerated treatment well    Behavior During Therapy WFL for tasks assessed/performed          Past Medical History:  Diagnosis Date   Allergy    Cataract    Depression    GERD (gastroesophageal reflux disease)    Hemorrhoids    Hyperlipidemia    Past Surgical History:  Procedure Laterality Date   ABDOMINAL HYSTERECTOMY     CATARACT EXTRACTION, BILATERAL Bilateral 08/2022   second one 09/2022   CHOLECYSTECTOMY     EYE SURGERY  2024   Cataract   FINGER SURGERY Right    index finger x 1, middle finger x 2   GALLBLADDER SURGERY     LAPAROSCOPIC APPENDECTOMY N/A 02/10/2024   Procedure: APPENDECTOMY, LAPAROSCOPIC;  Surgeon: Signe Mitzie LABOR, MD;  Location: [facility];  Service: General;  Laterality: N/A;   TONSILLECTOMY     TUBAL LIGATION     Patient Active Problem List   Diagnosis Date Noted   Perforated appendicitis 02/12/2024   Acute appendicitis 02/10/2024   Lymphadenopathy 08/19/2023   Vulvovaginal candidiasis 07/27/2023   Thrush 07/27/2023   Cough 07/27/2023   Acute sinusitis 07/15/2023   Benign paroxysmal positional vertigo 01/04/2023   Yeast dermatitis 07/02/2022   Depression 06/05/2021   Osteopenia 04/26/2020   Hypercalcemia 01/02/2020   Hyperlipemia 01/02/2020   Prediabetes 01/02/2020   Postmenopausal 12/13/2019   Knee pain, left 09/28/2017   Gastroesophageal reflux disease with esophagitis 05/18/2017   History of colon polyps 05/18/2017    PCP: Velma Ku, DO REFERRING PROVIDER: Velma Ku,  DO REFERRING DIAG: R42 (ICD-10-CM) - Vertigo   THERAPY DIAG:  Dizziness and giddiness  BPPV (benign paroxysmal positional vertigo), left  BPPV (benign paroxysmal positional vertigo), right  ONSET DATE: 03/22/24  Rationale for Evaluation and Treatment: Rehabilitation  SUBJECTIVE:   SUBJECTIVE STATEMENT: The patient has not had vertigo with bed mobility since last session. She noticed sensation it could come on with head nods when with family.  EVAL: The patient reports vertigo returned 2 weeks ago while at her hair appointment when leaning back to have hair washed. She gets symptoms when rolling R and L. She feels dizziness when getting clothes out of the dryer, rolling in bed, and doing quick turns.  Pt accompanied by: self  PERTINENT HISTORY: recent appendectomy, hyperlipidemia  PAIN:  Are you having pain? No  PRECAUTIONS: None  WEIGHT BEARING RESTRICTIONS: No  FALLS: Has patient fallen in last 6 months? No  PATIENT GOALS: reduce dizziness  OBJECTIVE:  Note: Objective measures were completed at Evaluation unless otherwise noted. PATIENT SURVEYS:  DHI: THE DIZZINESS HANDICAP INVENTORY (DHI)=40% at eval;  P1. Does looking up increase your problem? 2 = Sometimes  E2. Because of your problem, do you feel frustrated? 4 = Yes  F3. Because of your problem, do you restrict your travel for business or recreation?  0 = No  P4. Does walking down the aisle of a supermarket increase your problems?  0 = No  F5. Because of your problem, do you have difficulty getting into or out of bed?  4 = Yes  F6. Does your problem significantly restrict your participation in social activities, such as going out to dinner, going to the movies, dancing, or going to parties? 0 = No  F7. Because of your problem, do you have difficulty reading?  4 = Yes  P8. Does performing more ambitious activities such as sports, dancing, household chores (sweeping or putting dishes away) increase your problems?  2 =  Sometimes  E9. Because of your problem, are you afraid to leave your home without having without having someone accompany you?  0 = No  E10. Because of your problem have you been embarrassed in front of others?  0 = No  P11. Do quick movements of your head increase your problem?  4 = Yes  F12. Because of your problem, do you avoid heights?  4 = Yes  P13. Does turning over in bed increase your problem?  4 = Yes  F14. Because of your problem, is it difficult for you to do strenuous homework or yard work? 2 = Sometimes  E15. Because of your problem, are you afraid people may think you are intoxicated? 0 = No  F16. Because of your problem, is it difficult for you to go for a walk by yourself?  0 = No  P17. Does walking down a sidewalk increase your problem?  0 = No  E18.Because of your problem, is it difficult for you to concentrate 0 = No  F19. Because of your problem, is it difficult for you to walk around your house in the dark? 2 = Sometimes  E20. Because of your problem, are you afraid to stay home alone?  0 = No  E21. Because of your problem, do you feel handicapped? 0 = No  E22. Has the problem placed stress on your relationships with members of your family or friends? 0 = No  E23. Because of your problem, are you depressed?  0 = No  F24. Does your problem interfere with your job or household responsibilities?  2 = Sometimes  P25. Does bending over increase your problem?  4 = Yes  TOTAL 40%   VESTIBULAR ASSESSMENT: GENERAL OBSERVATION: The patient walks into clinic independently.   SYMPTOM BEHAVIOR:  Subjective history: h/o recurring vertigo, h/o multi-canal   Non-Vestibular symptoms: h/o visual reliance-- sensitive to ceiling fans or busy environments  Type of dizziness: Spinning/Vertigo and Unsteady with head/body turns  Frequency: daily  Duration: seconds to minutes  Aggravating factors: rolling R and L, bending forward, looking up  Relieving factors: head stationary  Progression  of symptoms: better  OCULOMOTOR EXAM:  Ocular Alignment: normal  Ocular ROM: No Limitations  Spontaneous Nystagmus: absent  Gaze-Induced Nystagmus: absent  Smooth Pursuits: intact and sensation of unbalanced in head  Saccades: intact and no symptoms of imbalance with quick eye movements   VESTIBULAR - OCULAR REFLEX:   Slow VOR: Normal and Comment: sensation of eyes/head needing to catch up after stopping *did not test head impulse due to guarding (and therefore, we are prescribing VOR as part of HEP regardless of result of head impulse testing    POSITIONAL TESTING: Right Dix-Hallpike: no nystagmus, however immediate sensation of spinning that lasts x 30 seconds Left Dix-Hallpike: upbeating, left nystagmus after a 12 second latency, then lasted for 45-60 seconds (so may be cupulolithiasis versus canalithiasis) Right Roll Test: no nystagmus Left Roll Test:  apogeotropic nystagmus viewed greater using Alexander's law (in room light) TESTING INDICATES L posterior canalithiasis (time of nystagmus may be more indicative of cupulolithiasis) and R horizontal cupulolithiasis  FUNCTIONAL GAIT: TBA as indicated   [facility] Adult PT Treatment:                                                DATE: 04/04/24 Neuromuscular re-ed: DHI: 14%  Habituation sit<>sidelying and rolling Positional Testing Bilateral sidelying Wnls without dizziness or nystagmus Sidelying test= R rolling gets a short duration response with patient grabbing mat due to intense sensation of spinning (only on first rep) Gaze adaptation X 1 viewing vertical x 30 seconds X 1 viewing horizontal  x 30 seconds Self Care: Discussed optokinetic stimuli and how she modified her movement    [facility] Adult PT Treatment:                                                DATE: 03/30/24 Neuromuscular re-ed: Positional testing R dix hallpike trace x 1-2 seconds-- could not differentiate direction L dix hallpike none R horizontal roll none L  horizontal roll none Gaze adaptation X 1 viewing horizontal seated X 1 viewing vertical seated Could tolerate 10 reps of each x 2 sets  Habituation Brandt daroff x 3 repetitions with rest in between   [facility] Adult PT Treatment:                                                DATE: 03/28/24 Neuromuscular re-ed: Positional testing:   R dix hallpike: no dizziness nystagmus L dix hallpike: trace nystagmus (cleared quickly-- not able to differentiate) R horizontal roll: trace sensation of dizziness, no nystagmus viewed in room light L horizontal roll: trace sensation of dizziness, no nystagmus  Habituation Horizontal rolling R and L sides x 5 reps Seated bend forward and return to upright x 3 reps-- produces nausea on 3rd repetition Head nods provided for HEP    [facility] Adult PT Treatment:                                                DATE: 03/25/24 Canalith Repositioning:  Comment: Kim maneuver for R cupulolithiasis x 1 rep. Provided vibration to mastoid during recommended positions Self: Discussed nature of condition  PATIENT EDUCATION: Education details: nature of symptoms Person educated: Patient Education method: Explanation Education comprehension: verbalized understanding  HOME EXERCISE PROGRAM: Access Code: [email or web address] URL: [email or web address] Date: 04/04/2024 Prepared by: Tawni Ferrier  Exercises - Rolling rightleft sides for vestibular habituation  - 2 x daily - 7 x weekly - 1 sets - 5 reps - Brandt-Daroff Vestibular Exercise  - 2 x daily - 7 x weekly - 1 sets - 5 reps - Seated Head Nod Vestibular Habituation  - 2 x daily - 7 x weekly - 1 sets - 5 reps - Seated Gaze Stabilization with Head Rotation  - 1 x daily - 7 x weekly - 1  sets - 10 reps - Seated Gaze Stabilization with Head Nod  - 1 x daily - 7 x weekly - 1 sets - 10 reps  GOALS: Goals reviewed with patient? Yes  SHORT TERM GOALS: Target date: 04/24/24  The patient will be indep with initial  HEP Baseline: initiated at eval Goal status: MET  2..   The patient will report no dizziness with bed mobility.  Baseline: Dizziness with rolling Goal status: MET  LONG TERM GOALS: Target date: 05/24/24  The patient will be indep with progression of HEP. Baseline:  initiated at eval Goal status: INITIAL  2.  The patient will improve DHI by 15% to demonstrate improved functional abilities. Baseline: 40% Goal status: MET  3.  The patient will have negative positional testing indicating resolution of BPPV. Baseline:  see above Goal status: INITIAL  4.  The patient will report self mgmt of BPPV due to chronic h/o vertigo. Baseline:   has h/o episodic vertigo Goal status: INITIAL  ASSESSMENT:  CLINICAL IMPRESSION: The patient arrived feeling like vertigo was improved, however she had one episode of spinning that resolved quickly when rolling onto the right side. PT reviewed habituation HEP and recommended continuation of gaze adaptation.  EVAL:Patient is a 68 y.o. female who was seen today for physical therapy evaluation and treatment for vertigo. She presents today with imapairments of decreased VOR (per symptoms with slow VOR), and multi-canal BPPV. PT initiated treatment today for R cupulolithiasis. She experienced severe spinning and nausea with return to sitting. Symptoms settled after resting x 5 minutes. PT to address deficits to return to baseline level of function.  OBJECTIVE IMPAIRMENTS: decreased activity tolerance, decreased balance, dizziness, and impaired vision/preception.   PLAN:  PT FREQUENCY: 1-2x/week  PT DURATION: 8 weeks  PLANNED INTERVENTIONS: 97164- PT Re-evaluation, 97750- Physical Performance Testing, 97110-Therapeutic exercises, 97530- Therapeutic activity, [id number]- Neuromuscular re-education, 97535- Self Care, [id number]- Manual therapy, [phone]- Gait training, [phone]- Canalith repositioning, Patient/Family education, Balance training, and Visual/preceptual  remediation/compensation  PLAN FOR NEXT SESSION: Plan to recheck positional maneuvers, needs gaze adaptation x 1 viewing. Discuss home management and habituation if needed.   Aesha Agrawal, PT 04/04/2024, 2:50 PM

## 2024-04-05 ENCOUNTER — Encounter (HOSPITAL_BASED_OUTPATIENT_CLINIC_OR_DEPARTMENT_OTHER): Payer: Self-pay | Admitting: Family Medicine

## 2024-04-05 ENCOUNTER — Other Ambulatory Visit (HOSPITAL_BASED_OUTPATIENT_CLINIC_OR_DEPARTMENT_OTHER): Payer: Self-pay | Admitting: Family Medicine

## 2024-04-05 DIAGNOSIS — Z1231 Encounter for screening mammogram for malignant neoplasm of breast: Secondary | ICD-10-CM

## 2024-04-11 ENCOUNTER — Other Ambulatory Visit: Payer: Self-pay | Admitting: Family Medicine

## 2024-04-13 ENCOUNTER — Ambulatory Visit: Admitting: Rehabilitative and Restorative Service Providers"

## 2024-04-13 ENCOUNTER — Encounter: Payer: Self-pay | Admitting: Rehabilitative and Restorative Service Providers"

## 2024-04-13 DIAGNOSIS — R42 Dizziness and giddiness: Secondary | ICD-10-CM

## 2024-04-13 DIAGNOSIS — H8113 Benign paroxysmal vertigo, bilateral: Secondary | ICD-10-CM | POA: Diagnosis not present

## 2024-04-13 NOTE — Therapy (Signed)
 OUTPATIENT PHYSICAL THERAPY VESTIBULAR TREATMENT   Patient Name: Rhonda Munoz MRN: [id number] DOB:02-02-1956, 68 y.o., female Today's Date: 04/13/2024  END OF SESSION:  PT End of Session - 04/13/24 1320     Visit Number 5    Number of Visits 8    Date for PT Re-Evaluation 04/24/24    Authorization Type medicare and tricare    PT Start Time 1320    PT Stop Time 1405    PT Time Calculation (min) 45 min    Activity Tolerance Patient tolerated treatment well    Behavior During Therapy WFL for tasks assessed/performed           Past Medical History:  Diagnosis Date   Allergy    Cataract    Depression    GERD (gastroesophageal reflux disease)    Hemorrhoids    Hyperlipidemia    Past Surgical History:  Procedure Laterality Date   ABDOMINAL HYSTERECTOMY     CATARACT EXTRACTION, BILATERAL Bilateral 08/2022   second one 09/2022   CHOLECYSTECTOMY     EYE SURGERY  2024   Cataract   FINGER SURGERY Right    index finger x 1, middle finger x 2   GALLBLADDER SURGERY     LAPAROSCOPIC APPENDECTOMY N/A 02/10/2024   Procedure: APPENDECTOMY, LAPAROSCOPIC;  Surgeon: Signe Mitzie LABOR, MD;  Location: [facility];  Service: General;  Laterality: N/A;   TONSILLECTOMY     TUBAL LIGATION     Patient Active Problem List   Diagnosis Date Noted   Perforated appendicitis 02/12/2024   Acute appendicitis 02/10/2024   Lymphadenopathy 08/19/2023   Vulvovaginal candidiasis 07/27/2023   Thrush 07/27/2023   Cough 07/27/2023   Acute sinusitis 07/15/2023   Benign paroxysmal positional vertigo 01/04/2023   Yeast dermatitis 07/02/2022   Depression 06/05/2021   Osteopenia 04/26/2020   Hypercalcemia 01/02/2020   Hyperlipemia 01/02/2020   Prediabetes 01/02/2020   Postmenopausal 12/13/2019   Knee pain, left 09/28/2017   Gastroesophageal reflux disease with esophagitis 05/18/2017   History of colon polyps 05/18/2017    PCP: Velma Ku, DO REFERRING PROVIDER: Velma Ku,  DO REFERRING DIAG: R42 (ICD-10-CM) - Vertigo   THERAPY DIAG:  Dizziness and giddiness  ONSET DATE: 03/22/24  Rationale for Evaluation and Treatment: Rehabilitation  SUBJECTIVE:   SUBJECTIVE STATEMENT: The patient had an appointment this morning for her hair. She was able to tolerate rinsing her hair but notes modifications of moving slowly and stopping part of the way down to allow it to settle. She still gets dizziness with rolling to the right side and is still cautious with her home exercises.   EVAL: The patient reports vertigo returned 2 weeks ago while at her hair appointment when leaning back to have hair washed. She gets symptoms when rolling R and L. She feels dizziness when getting clothes out of the dryer, rolling in bed, and doing quick turns.  Pt accompanied by: self  PERTINENT HISTORY: recent appendectomy, hyperlipidemia  PAIN:  Are you having pain? No  PRECAUTIONS: None  WEIGHT BEARING RESTRICTIONS: No  FALLS: Has patient fallen in last 6 months? No  PATIENT GOALS: reduce dizziness  OBJECTIVE:  Note: Objective measures were completed at Evaluation unless otherwise noted. PATIENT SURVEYS:  DHI: THE DIZZINESS HANDICAP INVENTORY (DHI)=40% at eval;  P1. Does looking up increase your problem? 2 = Sometimes  E2. Because of your problem, do you feel frustrated? 4 = Yes  F3. Because of your problem, do you restrict your travel for business or recreation?  0 = No  P4. Does walking down the aisle of a supermarket increase your problems?  0 = No  F5. Because of your problem, do you have difficulty getting into or out of bed?  4 = Yes  F6. Does your problem significantly restrict your participation in social activities, such as going out to dinner, going to the movies, dancing, or going to parties? 0 = No  F7. Because of your problem, do you have difficulty reading?  4 = Yes  P8. Does performing more ambitious activities such as sports, dancing, household chores (sweeping  or putting dishes away) increase your problems?  2 = Sometimes  E9. Because of your problem, are you afraid to leave your home without having without having someone accompany you?  0 = No  E10. Because of your problem have you been embarrassed in front of others?  0 = No  P11. Do quick movements of your head increase your problem?  4 = Yes  F12. Because of your problem, do you avoid heights?  4 = Yes  P13. Does turning over in bed increase your problem?  4 = Yes  F14. Because of your problem, is it difficult for you to do strenuous homework or yard work? 2 = Sometimes  E15. Because of your problem, are you afraid people may think you are intoxicated? 0 = No  F16. Because of your problem, is it difficult for you to go for a walk by yourself?  0 = No  P17. Does walking down a sidewalk increase your problem?  0 = No  E18.Because of your problem, is it difficult for you to concentrate 0 = No  F19. Because of your problem, is it difficult for you to walk around your house in the dark? 2 = Sometimes  E20. Because of your problem, are you afraid to stay home alone?  0 = No  E21. Because of your problem, do you feel handicapped? 0 = No  E22. Has the problem placed stress on your relationships with members of your family or friends? 0 = No  E23. Because of your problem, are you depressed?  0 = No  F24. Does your problem interfere with your job or household responsibilities?  2 = Sometimes  P25. Does bending over increase your problem?  4 = Yes  TOTAL 40%   VESTIBULAR ASSESSMENT: GENERAL OBSERVATION: The patient walks into clinic independently.   SYMPTOM BEHAVIOR:  Subjective history: h/o recurring vertigo, h/o multi-canal   Non-Vestibular symptoms: h/o visual reliance-- sensitive to ceiling fans or busy environments  Type of dizziness: Spinning/Vertigo and Unsteady with head/body turns  Frequency: daily  Duration: seconds to minutes  Aggravating factors: rolling R and L, bending forward, looking  up  Relieving factors: head stationary  Progression of symptoms: better  OCULOMOTOR EXAM:  Ocular Alignment: normal  Ocular ROM: No Limitations  Spontaneous Nystagmus: absent  Gaze-Induced Nystagmus: absent  Smooth Pursuits: intact and sensation of unbalanced in head  Saccades: intact and no symptoms of imbalance with quick eye movements   VESTIBULAR - OCULAR REFLEX:   Slow VOR: Normal and Comment: sensation of eyes/head needing to catch up after stopping *did not test head impulse due to guarding (and therefore, we are prescribing VOR as part of HEP regardless of result of head impulse testing    POSITIONAL TESTING: Right Dix-Hallpike: no nystagmus, however immediate sensation of spinning that lasts x 30 seconds Left Dix-Hallpike: upbeating, left nystagmus after a 12 second latency, then lasted  for 45-60 seconds (so may be cupulolithiasis versus canalithiasis) Right Roll Test: no nystagmus Left Roll Test: apogeotropic nystagmus viewed greater using Alexander's law (in room light) TESTING INDICATES L posterior canalithiasis (time of nystagmus may be more indicative of cupulolithiasis) and R horizontal cupulolithiasis  FUNCTIONAL GAIT: TBA as indicated   [facility] Adult PT Treatment:                                                DATE: 04/13/24 Neuromuscular re-ed: Positional Testing: L dix hallpike=approximately 10 second latency, low amplitude nystagmus x 40+ seconds  R dix hallpike: approx 5 second latency, large amplitude nystagmus lasting 10 seconds L horizontal roll=negative for nystagmus R horizontal roll=negative for nystagmus or dizziness Retested after R Epley's maneuver PT noted direction change in nystagmus to a R beating horizontal nystagmus that was geotropic in nature Tested R horizontal roll and L horizontal roll and initially saw low amplitude nystagmus with greater intensity on the left, so initiated treatment for L horizontal canlithiasis Canolith Repositioning: R  Epley's maneuver x 1 rep (see retest information above after maneuver) L horizontal canolith repositioning  Began in L head rotation, then moved to supine, then moved to R roll *Patient developed severe, high amplitude nystagmus that were geotropic in nature in this position. She developed nausea and repositioning maneuver ended to allow for nausea to settle Self Care: Discussed home safety with continued multi-canal BPPV recommending Patient has zofran  from recent appendectomy-- PT to let her referring MD know she plans to take prn for nausea  [facility] Adult PT Treatment:                                                DATE: 04/04/24 Neuromuscular re-ed: DHI: 14%  Habituation sit<>sidelying and rolling Positional Testing Bilateral sidelying Wnls without dizziness or nystagmus Sidelying test= R rolling gets a short duration response with patient grabbing mat due to intense sensation of spinning (only on first rep) Gaze adaptation X 1 viewing vertical x 30 seconds X 1 viewing horizontal  x 30 seconds Self Care: Discussed optokinetic stimuli and how she modified her movement    [facility] Adult PT Treatment:                                                DATE: 03/30/24 Neuromuscular re-ed: Positional testing R dix hallpike trace x 1-2 seconds-- could not differentiate direction L dix hallpike none R horizontal roll none L horizontal roll none Gaze adaptation X 1 viewing horizontal seated X 1 viewing vertical seated Could tolerate 10 reps of each x 2 sets  Habituation Brandt daroff x 3 repetitions with rest in between   [facility] Adult PT Treatment:                                                DATE: 03/28/24 Neuromuscular re-ed: Positional testing:   R dix hallpike: no dizziness nystagmus L dix hallpike: trace nystagmus (cleared quickly--  not able to differentiate) R horizontal roll: trace sensation of dizziness, no nystagmus viewed in room light L horizontal roll: trace sensation of dizziness,  no nystagmus  Habituation Horizontal rolling R and L sides x 5 reps Seated bend forward and return to upright x 3 reps-- produces nausea on 3rd repetition Head nods provided for HEP    [facility] Adult PT Treatment:                                                DATE: 03/25/24 Canalith Repositioning:  Comment: Kim maneuver for R cupulolithiasis x 1 rep. Provided vibration to mastoid during recommended positions Self: Discussed nature of condition  PATIENT EDUCATION: Education details: nature of symptoms Person educated: Patient Education method: Explanation Education comprehension: verbalized understanding  HOME EXERCISE PROGRAM: Access Code: [email or web address] URL: [email or web address] Date: 04/04/2024 Prepared by: Tawni Ferrier  Exercises - Rolling rightleft sides for vestibular habituation  - 2 x daily - 7 x weekly - 1 sets - 5 reps - Brandt-Daroff Vestibular Exercise  - 2 x daily - 7 x weekly - 1 sets - 5 reps - Seated Head Nod Vestibular Habituation  - 2 x daily - 7 x weekly - 1 sets - 5 reps - Seated Gaze Stabilization with Head Rotation  - 1 x daily - 7 x weekly - 1 sets - 10 reps - Seated Gaze Stabilization with Head Nod  - 1 x daily - 7 x weekly - 1 sets - 10 reps  GOALS: Goals reviewed with patient? Yes  SHORT TERM GOALS: Target date: 04/24/24  The patient will be indep with initial HEP Baseline: initiated at eval Goal status: MET  2..   The patient will report no dizziness with bed mobility.  Baseline: Dizziness with rolling Goal status: MET  LONG TERM GOALS: Target date: 05/24/24  The patient will be indep with progression of HEP. Baseline:  initiated at eval Goal status: INITIAL  2.  The patient will improve DHI by 15% to demonstrate improved functional abilities. Baseline: 40% Goal status: MET  3.  The patient will have negative positional testing indicating resolution of BPPV. Baseline:  see above Goal status: INITIAL  4.  The patient will report  self mgmt of BPPV due to chronic h/o vertigo. Baseline:   has h/o episodic vertigo Goal status: INITIAL  ASSESSMENT:  CLINICAL IMPRESSION: The patient presented today still reporting overall improvement, but describing very guarded movements at the hair dresser and dizziness with R rolling at home. With positional testing, she presented initially with low amplitude longer duration upbeating L nystagmus with L dix hallpike indicating L posterior canalithiasis. She also had higher amplitude, short duration upbeating R rotary nystagmus with R dix hallpike. PT initiated treatment for R posterior canalithiasis with Epley's maneuver (since R felt more intense). When retesting, patient demonstrated horizontal nystagmus that showed that her otoconia repositioned into the horizontal canal during Epley's.  PT initiated treatment for L horizontal canal, however nystagmus were more intense when we rolled into the R sidelying position. Patient was nauseous and we stopped treatment to allow symptoms to settle. PT to reassess next visit.   EVAL:Patient is a 68 y.o. female who was seen today for physical therapy evaluation and treatment for vertigo. She presents today with imapairments of decreased VOR (per symptoms with slow VOR), and multi-canal BPPV. PT initiated treatment today  for R cupulolithiasis. She experienced severe spinning and nausea with return to sitting. Symptoms settled after resting x 5 minutes. PT to address deficits to return to baseline level of function.  OBJECTIVE IMPAIRMENTS: decreased activity tolerance, decreased balance, dizziness, and impaired vision/preception.   PLAN:  PT FREQUENCY: 1-2x/week  PT DURATION: 8 weeks  PLANNED INTERVENTIONS: 97164- PT Re-evaluation, 97750- Physical Performance Testing, 97110-Therapeutic exercises, 97530- Therapeutic activity, [id number]- Neuromuscular re-education, 97535- Self Care, [id number]- Manual therapy, [phone]- Gait training, [phone]- Canalith repositioning,  Patient/Family education, Balance training, and Visual/preceptual remediation/compensation  PLAN FOR NEXT SESSION: Plan to recheck positional maneuvers-- treat as indicated. *Have emesis bag*   Sevag Shearn, PT 04/13/2024, 2:35 PM

## 2024-04-19 ENCOUNTER — Ambulatory Visit: Admitting: Rehabilitative and Restorative Service Providers"

## 2024-04-19 DIAGNOSIS — R42 Dizziness and giddiness: Secondary | ICD-10-CM

## 2024-04-19 DIAGNOSIS — H8111 Benign paroxysmal vertigo, right ear: Secondary | ICD-10-CM

## 2024-04-19 DIAGNOSIS — H8113 Benign paroxysmal vertigo, bilateral: Secondary | ICD-10-CM | POA: Diagnosis not present

## 2024-04-19 DIAGNOSIS — H8112 Benign paroxysmal vertigo, left ear: Secondary | ICD-10-CM

## 2024-04-19 NOTE — Therapy (Addendum)
 OUTPATIENT PHYSICAL THERAPY VESTIBULAR TREATMENT AND DISCHARGE SUMMARY   Patient Name: Rhonda Munoz MRN: [id number] DOB:[birth year], 68 y.o., female Today's Date: 04/19/2024   PHYSICAL THERAPY DISCHARGE SUMMARY  Visits from Start of Care: 6  Current functional level related to goals / functional outcomes: See below for last known status.    Remaining deficits: See below for last known status   Education / Equipment: HEP   Patient agrees to discharge. Patient goals were partially met. Patient is being discharged due to not returning since the last visit.  END OF SESSION:  PT End of Session - 04/19/24 1449     Visit Number 6    Number of Visits 8    Date for PT Re-Evaluation 04/24/24    Authorization Type medicare and tricare    PT Start Time 1450    PT Stop Time 1528    PT Time Calculation (min) 38 min    Activity Tolerance Patient tolerated treatment well    Behavior During Therapy WFL for tasks assessed/performed          Past Medical History:  Diagnosis Date   Allergy    Cataract    Depression    GERD (gastroesophageal reflux disease)    Hemorrhoids    Hyperlipidemia    Past Surgical History:  Procedure Laterality Date   ABDOMINAL HYSTERECTOMY     CATARACT EXTRACTION, BILATERAL Bilateral 08/2022   second one 09/2022   CHOLECYSTECTOMY     EYE SURGERY  2024   Cataract   FINGER SURGERY Right    index finger x 1, middle finger x 2   GALLBLADDER SURGERY     LAPAROSCOPIC APPENDECTOMY N/A 02/10/2024   Procedure: APPENDECTOMY, LAPAROSCOPIC;  Surgeon: Signe Mitzie LABOR, MD;  Location: [facility];  Service: General;  Laterality: N/A;   TONSILLECTOMY     TUBAL LIGATION     Patient Active Problem List   Diagnosis Date Noted   Perforated appendicitis 02/12/2024   Acute appendicitis 02/10/2024   Lymphadenopathy 08/19/2023   Vulvovaginal candidiasis 07/27/2023   Thrush 07/27/2023   Cough 07/27/2023   Acute sinusitis 07/15/2023   Benign paroxysmal  positional vertigo 01/04/2023   Yeast dermatitis 07/02/2022   Depression 06/05/2021   Osteopenia 04/26/2020   Hypercalcemia 01/02/2020   Hyperlipemia 01/02/2020   Prediabetes 01/02/2020   Postmenopausal 12/13/2019   Knee pain, left 09/28/2017   Gastroesophageal reflux disease with esophagitis 05/18/2017   History of colon polyps 05/18/2017    PCP: Velma Ku, DO REFERRING PROVIDER: Velma Ku, DO REFERRING DIAG: R42 (ICD-10-CM) - Vertigo   THERAPY DIAG:  Dizziness and giddiness  BPPV (benign paroxysmal positional vertigo), left  BPPV (benign paroxysmal positional vertigo), right  ONSET DATE: 03/22/24  Rationale for Evaluation and Treatment: Rehabilitation  SUBJECTIVE:   SUBJECTIVE STATEMENT: The patient has had ongoing dizziness since last session. Seated head turns are not bothering her, but rolling is still bothersome.   EVAL: The patient reports vertigo returned 2 weeks ago while at her hair appointment when leaning back to have hair washed. She gets symptoms when rolling R and L. She feels dizziness when getting clothes out of the dryer, rolling in bed, and doing quick turns.  Pt accompanied by: self  PERTINENT HISTORY: recent appendectomy, hyperlipidemia  PAIN:  Are you having pain? No  PRECAUTIONS: None  WEIGHT BEARING RESTRICTIONS: No  FALLS: Has patient fallen in last 6 months? No  PATIENT GOALS: reduce dizziness  OBJECTIVE:  Note: Objective measures were completed at Evaluation unless  otherwise noted. PATIENT SURVEYS:  DHI: THE DIZZINESS HANDICAP INVENTORY (DHI)=40% at eval;  P1. Does looking up increase your problem? 2 = Sometimes  E2. Because of your problem, do you feel frustrated? 4 = Yes  F3. Because of your problem, do you restrict your travel for business or recreation?  0 = No  P4. Does walking down the aisle of a supermarket increase your problems?  0 = No  F5. Because of your problem, do you have difficulty getting into or out of bed?   4 = Yes  F6. Does your problem significantly restrict your participation in social activities, such as going out to dinner, going to the movies, dancing, or going to parties? 0 = No  F7. Because of your problem, do you have difficulty reading?  4 = Yes  P8. Does performing more ambitious activities such as sports, dancing, household chores (sweeping or putting dishes away) increase your problems?  2 = Sometimes  E9. Because of your problem, are you afraid to leave your home without having without having someone accompany you?  0 = No  E10. Because of your problem have you been embarrassed in front of others?  0 = No  P11. Do quick movements of your head increase your problem?  4 = Yes  F12. Because of your problem, do you avoid heights?  4 = Yes  P13. Does turning over in bed increase your problem?  4 = Yes  F14. Because of your problem, is it difficult for you to do strenuous homework or yard work? 2 = Sometimes  E15. Because of your problem, are you afraid people may think you are intoxicated? 0 = No  F16. Because of your problem, is it difficult for you to go for a walk by yourself?  0 = No  P17. Does walking down a sidewalk increase your problem?  0 = No  E18.Because of your problem, is it difficult for you to concentrate 0 = No  F19. Because of your problem, is it difficult for you to walk around your house in the dark? 2 = Sometimes  E20. Because of your problem, are you afraid to stay home alone?  0 = No  E21. Because of your problem, do you feel handicapped? 0 = No  E22. Has the problem placed stress on your relationships with members of your family or friends? 0 = No  E23. Because of your problem, are you depressed?  0 = No  F24. Does your problem interfere with your job or household responsibilities?  2 = Sometimes  P25. Does bending over increase your problem?  4 = Yes  TOTAL 40%   VESTIBULAR ASSESSMENT: GENERAL OBSERVATION: The patient walks into clinic independently.   SYMPTOM  BEHAVIOR:  Subjective history: h/o recurring vertigo, h/o multi-canal   Non-Vestibular symptoms: h/o visual reliance-- sensitive to ceiling fans or busy environments  Type of dizziness: Spinning/Vertigo and Unsteady with head/body turns  Frequency: daily  Duration: seconds to minutes  Aggravating factors: rolling R and L, bending forward, looking up  Relieving factors: head stationary  Progression of symptoms: better  OCULOMOTOR EXAM:  Ocular Alignment: normal  Ocular ROM: No Limitations  Spontaneous Nystagmus: absent  Gaze-Induced Nystagmus: absent  Smooth Pursuits: intact and sensation of unbalanced in head  Saccades: intact and no symptoms of imbalance with quick eye movements   VESTIBULAR - OCULAR REFLEX:   Slow VOR: Normal and Comment: sensation of eyes/head needing to catch up after stopping *did not test  head impulse due to guarding (and therefore, we are prescribing VOR as part of HEP regardless of result of head impulse testing    POSITIONAL TESTING: Right Dix-Hallpike: no nystagmus, however immediate sensation of spinning that lasts x 30 seconds Left Dix-Hallpike: upbeating, left nystagmus after a 12 second latency, then lasted for 45-60 seconds (so may be cupulolithiasis versus canalithiasis) Right Roll Test: no nystagmus Left Roll Test: apogeotropic nystagmus viewed greater using Alexander's law (in room light) TESTING INDICATES L posterior canalithiasis (time of nystagmus may be more indicative of cupulolithiasis) and R horizontal cupulolithiasis  FUNCTIONAL GAIT: TBA as indicated   [facility] Adult PT Treatment:                                                DATE: 04/19/24 Neuromuscular re-ed: Positional Testing: L horizontal roll=negative x 2 times R horizontal roll=negative x 2 times L dix hallpike=positive for upbeating, rotary nystagmus R dix hallpike=negative Canolith Repositioning: L epley's maneuver=1 rep, rested to ensure nausea (feeling in parotid region)  settles Retested and no nystagmus viewed in room light Ended session recommending rolling at home for functional tasks   [facility] Adult PT Treatment:                                                DATE: 04/13/24 Neuromuscular re-ed: Positional Testing: L dix hallpike=approximately 10 second latency, low amplitude nystagmus x 40+ seconds  R dix hallpike: approx 5 second latency, large amplitude nystagmus lasting 10 seconds L horizontal roll=negative for nystagmus R horizontal roll=negative for nystagmus or dizziness Retested after R Epley's maneuver PT noted direction change in nystagmus to a R beating horizontal nystagmus that was geotropic in nature Tested R horizontal roll and L horizontal roll and initially saw low amplitude nystagmus with greater intensity on the left, so initiated treatment for L horizontal canlithiasis Canolith Repositioning: R Epley's maneuver x 1 rep (see retest information above after maneuver) L horizontal canolith repositioning  Began in L head rotation, then moved to supine, then moved to R roll *Patient developed severe, high amplitude nystagmus that were geotropic in nature in this position. She developed nausea and repositioning maneuver ended to allow for nausea to settle Self Care: Discussed home safety with continued multi-canal BPPV recommending Patient has zofran  from recent appendectomy-- PT to let her referring MD know she plans to take prn for nausea  [facility] Adult PT Treatment:                                                DATE: 04/04/24 Neuromuscular re-ed: DHI: 14%  Habituation sit<>sidelying and rolling Positional Testing Bilateral sidelying Wnls without dizziness or nystagmus Sidelying test= R rolling gets a short duration response with patient grabbing mat due to intense sensation of spinning (only on first rep) Gaze adaptation X 1 viewing vertical x 30 seconds X 1 viewing horizontal  x 30 seconds Self Care: Discussed optokinetic stimuli and how  she modified her movement    [facility] Adult PT Treatment:  DATE: 03/30/24 Neuromuscular re-ed: Positional testing R dix hallpike trace x 1-2 seconds-- could not differentiate direction L dix hallpike none R horizontal roll none L horizontal roll none Gaze adaptation X 1 viewing horizontal seated X 1 viewing vertical seated Could tolerate 10 reps of each x 2 sets  Habituation Brandt daroff x 3 repetitions with rest in between   [facility] Adult PT Treatment:                                                DATE: 03/28/24 Neuromuscular re-ed: Positional testing:   R dix hallpike: no dizziness nystagmus L dix hallpike: trace nystagmus (cleared quickly-- not able to differentiate) R horizontal roll: trace sensation of dizziness, no nystagmus viewed in room light L horizontal roll: trace sensation of dizziness, no nystagmus  Habituation Horizontal rolling R and L sides x 5 reps Seated bend forward and return to upright x 3 reps-- produces nausea on 3rd repetition Head nods provided for HEP    [facility] Adult PT Treatment:                                                DATE: 03/25/24 Canalith Repositioning:  Comment: Kim maneuver for R cupulolithiasis x 1 rep. Provided vibration to mastoid during recommended positions Self: Discussed nature of condition  PATIENT EDUCATION: Education details: nature of symptoms Person educated: Patient Education method: Explanation Education comprehension: verbalized understanding  HOME EXERCISE PROGRAM: Access Code: [email or web address] URL: [email or web address] Date: 04/04/2024 Prepared by: Tawni Ferrier  Exercises - Rolling rightleft sides for vestibular habituation  - 2 x daily - 7 x weekly - 1 sets - 5 reps - Brandt-Daroff Vestibular Exercise  - 2 x daily - 7 x weekly - 1 sets - 5 reps - Seated Head Nod Vestibular Habituation  - 2 x daily - 7 x weekly - 1 sets - 5 reps - Seated Gaze Stabilization with  Head Rotation  - 1 x daily - 7 x weekly - 1 sets - 10 reps - Seated Gaze Stabilization with Head Nod  - 1 x daily - 7 x weekly - 1 sets - 10 reps  GOALS: Goals reviewed with patient? Yes  SHORT TERM GOALS: Target date: 04/24/24  The patient will be indep with initial HEP Baseline: initiated at eval Goal status: MET  2..   The patient will report no dizziness with bed mobility.  Baseline: Dizziness with rolling Goal status: MET  LONG TERM GOALS: Target date: 05/24/24  The patient will be indep with progression of HEP. Baseline:  initiated at eval Goal status: INITIAL  2.  The patient will improve DHI by 15% to demonstrate improved functional abilities. Baseline: 40% Goal status: MET  3.  The patient will have negative positional testing indicating resolution of BPPV. Baseline:  see above Goal status: INITIAL  4.  The patient will report self mgmt of BPPV due to chronic h/o vertigo. Baseline:   has h/o episodic vertigo Goal status: INITIAL  ASSESSMENT:  CLINICAL IMPRESSION: The patient has improvement today with positional testing with only mild nystagmus viewed with L dix hallpike. Functionally, she is still experiencing spinning at home with rolling in bed at night, but horizontal canal symptoms were not provoked  during today's session. Plan to reassess and treat based on clinical presentation.   EVAL:Patient is a 68 y.o. female who was seen today for physical therapy evaluation and treatment for vertigo. She presents today with imapairments of decreased VOR (per symptoms with slow VOR), and multi-canal BPPV. PT initiated treatment today for R cupulolithiasis. She experienced severe spinning and nausea with return to sitting. Symptoms settled after resting x 5 minutes. PT to address deficits to return to baseline level of function.  OBJECTIVE IMPAIRMENTS: decreased activity tolerance, decreased balance, dizziness, and impaired vision/preception.   PLAN:  PT FREQUENCY:  1-2x/week  PT DURATION: 8 weeks  PLANNED INTERVENTIONS: 97164- PT Re-evaluation, 97750- Physical Performance Testing, 97110-Therapeutic exercises, 97530- Therapeutic activity, [id number]- Neuromuscular re-education, 97535- Self Care, [id number]- Manual therapy, [phone]- Gait training, [phone]- Canalith repositioning, Patient/Family education, Balance training, and Visual/preceptual remediation/compensation  PLAN FOR NEXT SESSION: Plan to recheck positional maneuvers-- treat as indicated.    Xzavier Swinger, PT 04/19/2024, 3:46 PM

## 2024-04-25 ENCOUNTER — Encounter: Payer: Self-pay | Admitting: Family Medicine

## 2024-04-26 ENCOUNTER — Other Ambulatory Visit: Payer: Self-pay | Admitting: Family Medicine

## 2024-04-26 ENCOUNTER — Encounter: Payer: Self-pay | Admitting: Sports Medicine

## 2024-04-26 MED ORDER — DESVENLAFAXINE SUCCINATE ER 50 MG PO TB24: 50.0000 mg | ORAL_TABLET | Freq: Every day | ORAL | 0 refills | Status: DC

## 2024-04-28 ENCOUNTER — Ambulatory Visit: Admitting: Rehabilitative and Restorative Service Providers"

## 2024-05-09 ENCOUNTER — Telehealth: Admitting: Family Medicine

## 2024-05-09 ENCOUNTER — Encounter: Payer: Self-pay | Admitting: Family Medicine

## 2024-05-09 ENCOUNTER — Ambulatory Visit: Payer: Self-pay

## 2024-05-09 DIAGNOSIS — J988 Other specified respiratory disorders: Secondary | ICD-10-CM | POA: Diagnosis not present

## 2024-05-09 DIAGNOSIS — U071 COVID-19: Secondary | ICD-10-CM

## 2024-05-09 MED ORDER — NIRMATRELVIR/RITONAVIR (PAXLOVID)TABLET: 3.0000 | ORAL_TABLET | Freq: Two times a day (BID) | ORAL | 0 refills | Status: AC

## 2024-05-09 NOTE — Progress Notes (Addendum)
 Virtual Visit via Video Note  I connected with Rhonda Munoz  on 05/09/24 at  3:20 PM EDT by a video enabled telemedicine application and verified that I am speaking with the correct person using two identifiers.  Location patient: [place] Location provider:work or home office Persons participating in the virtual visit: patient, provider  I discussed the limitations and requested verbal permission for telemedicine visit. The patient expressed understanding and agreed to proceed.   HPI: 68 year old female being seen today for cough. Onset yesterday: Chills, headache, runny nose, dry cough. Symptoms have persisted and today COVID test was positive at home. She just got back from spending 4 days on a bus tour trip.  She has had several loose stools. No chest pain.  No nausea or vomiting. No rash.  ROS: See pertinent positives and negatives per HPI.  Past Medical History:  Diagnosis Date   Allergy    Cataract    Depression    GERD (gastroesophageal reflux disease)    Hemorrhoids    Hyperlipidemia     Past Surgical History:  Procedure Laterality Date   ABDOMINAL HYSTERECTOMY     CATARACT EXTRACTION, BILATERAL Bilateral 08/2022   second one 09/2022   CHOLECYSTECTOMY     EYE SURGERY  2024   Cataract   FINGER SURGERY Right    index finger x 1, middle finger x 2   GALLBLADDER SURGERY     LAPAROSCOPIC APPENDECTOMY N/A 02/10/2024   Procedure: APPENDECTOMY, LAPAROSCOPIC;  Surgeon: Signe Mitzie LABOR, MD;  Location: [facility];  Service: General;  Laterality: N/A;   TONSILLECTOMY     TUBAL LIGATION       Current Outpatient Medications:    acetaminophen  (TYLENOL ) 500 MG tablet, Take 2 tablets (1,000 mg total) by mouth every 6 (six) hours as needed for mild pain (pain score 1-3), fever or headache., Disp: , Rfl:    aspirin  81 MG EC tablet, Take 81 mg by mouth daily., Disp: , Rfl:    Calcium Carbonate-Vitamin D  (CALCIUM 600 +D HIGH POTENCY PO), Take 600 mg by mouth daily., Disp: , Rfl:     desvenlafaxine  (PRISTIQ ) 50 MG 24 hr tablet, Take 1 tablet (50 mg total) by mouth daily., Disp: 90 tablet, Rfl: 0   Magnesium Glycinate 100 MG CAPS, Take 100 mg by mouth at bedtime., Disp: , Rfl:    Multiple Vitamins-Minerals (DAILY MULTIVITAMIN PO), Take 2 tablets by mouth daily., Disp: , Rfl:    pantoprazole  (PROTONIX ) 40 MG tablet, TAKE 1 TABLET DAILY, Disp: 90 tablet, Rfl: 3   pravastatin  (PRAVACHOL ) 20 MG tablet, TAKE 1 TABLET EVERY EVENING, Disp: 90 tablet, Rfl: 3  EXAM:  VITALS per patient if applicable:     03/22/2024    1:47 PM 02/12/2024    5:03 AM 02/11/2024   11:48 PM  Vitals with BMI  Height 5' 7    Weight 188 lbs    BMI 29.44    Systolic 111 112 895  Diastolic 73 58 56  Pulse 80 75      GENERAL: alert, oriented, appears well and in no acute distress  HEENT: atraumatic, conjunttiva clear, no obvious abnormalities on inspection of external nose and ears  NECK: normal movements of the head and neck  LUNGS: on inspection no signs of respiratory distress, breathing rate appears normal, no obvious gross SOB, gasping or wheezing  CV: no obvious cyanosis  MS: moves all visible extremities without noticeable abnormality  PSYCH/NEURO: pleasant and cooperative, no obvious depression or anxiety, speech and  thought processing grossly intact  LABS: pos home covid today.    Chemistry      Component Value Date/Time   NA 141 02/12/2024 0449   NA 139 08/12/2023 0840   K 4.2 02/12/2024 0449   CL 106 02/12/2024 0449   CO2 25 02/12/2024 0449   BUN 5 (L) 02/12/2024 0449   BUN 9 08/12/2023 0840   CREATININE <0.30 (L) 02/12/2024 0449   CREATININE 0.73 02/26/2022 0838      Component Value Date/Time   CALCIUM 8.9 02/12/2024 0449   ALKPHOS 110 02/10/2024 0847   AST 21 02/10/2024 0847   ALT 15 02/10/2024 0847   BILITOT 0.4 02/10/2024 0847   BILITOT 0.3 08/12/2023 0840     ASSESSMENT AND PLAN:  Discussed the following assessment and plan:  COVID-19 respiratory  infection. She is at high risk for complication from COVID infection. GFR>60. Paxlovid  prescribed. Discussed over-the-counter symptomatic care.   I discussed the assessment and treatment plan with the patient. The patient was provided an opportunity to ask questions and all were answered. The patient agreed with the plan and demonstrated an understanding of the instructions.   F/u: If not significantly improving in 2 to 3 days  Signed:  [name], MD           05/09/2024

## 2024-05-09 NOTE — Telephone Encounter (Signed)
 Patient scheduled.

## 2024-05-09 NOTE — Telephone Encounter (Signed)
 Video appointment made for today 05/09/2024 at 3:20 pm at [facility] with Dr Manus Hockey  FYI Only or Action Required?: FYI only for provider.  Patient was last seen in primary care on 03/22/2024 by Alvia Bring, DO.  Called Nurse Triage reporting Covid Positive.  Symptoms began yesterday.  Interventions attempted: OTC medications: zyrtec , zinc, Rest, hydration, or home remedies, and Other: hot tea and honey.  Symptoms are: gradually worsening.  Triage Disposition: Call PCP Within 24 Hours  Patient/caregiver understands and will follow disposition?: Yes--video visit made with another office within pcp group/area due to no availability in pcp office               Copied from CRM [id number]. Topic: Clinical - Medical Advice >> May 09, 2024 12:40 PM Graeme ORN wrote: Reason for CRM: Patient called. Returned from 5 day trip feeling bad. Tested positive for Covid today. Symptoms started yesterday morning headache upset stomaet stuffy nose. Would like to know if provider recommends Paxlovid . Thank You Reason for Disposition  [1] HIGH RISK patient (e.g., weak immune system, age > 64 years, obesity with BMI 30 or higher, pregnant, chronic lung disease or other chronic medical condition) AND [2] COVID symptoms (e.g., cough, fever)  (Exceptions: Already seen by PCP and no new or worsening symptoms.)  Answer Assessment - Initial Assessment Questions 5 day bus trip Yesterday morning--aching Yesterday started having chills Headache, sore throat--slightly, sinus drainage, very slight dry cough started this morning, left ear hurts, upset stomach Got home at 11:30am today 99.9 temp today Patient took home covid test and it was positive.  Took a Zyrtec  last night, Zinc, hot tea with honey, hot shower Patient is scheduled video visit today at [facility]   1. COVID-19 DIAGNOSIS: How do you know that you have COVID? (e.g., positive lab test or self-test, diagnosed by doctor or  NP/PA, symptoms after exposure).     Home test positive 2. COVID-19 EXPOSURE: Was there any known exposure to COVID before the symptoms began? CDC Definition of close contact: within 6 feet (2 meters) for a total of 15 minutes or more over a 24-hour period.      Bus trip 3. ONSET: When did the COVID-19 symptoms start?      yesterday 4. WORST SYMPTOM: What is your worst symptom? (e.g., cough, fever, shortness of breath, muscle aches)     Aching, chills, cough, sore throat 5. COUGH: Do you have a cough? If Yes, ask: How bad is the cough?       Slight dry 6. FEVER: Do you have a fever? If Yes, ask: What is your temperature, how was it measured, and when did it start?      7. RESPIRATORY STATUS: Describe your breathing? (e.g., normal; shortness of breath, wheezing, unable to speak)      denies 8. BETTER-SAME-WORSE: Are you getting better, staying the same or getting worse compared to yesterday?  If getting worse, ask, In what way?     ------- 9. OTHER SYMPTOMS: Do you have any other symptoms?  (e.g., chills, fatigue, headache, loss of smell or taste, muscle pain, sore throat)     Slight headache, slight sore throat, sinus drainage, chills, slight dry cough 10. HIGH RISK DISEASE: Do you have any chronic medical problems? (e.g., asthma, heart or lung disease, weak immune system, obesity, etc.)       68 years old 44. VACCINE: Have you had the COVID-19 vaccine? If Yes, ask: Which one, how many shots,  when did you get it?       -----  Protocols used: Coronavirus (COVID-19) Diagnosed or Suspected-A-AH

## 2024-05-13 ENCOUNTER — Encounter: Admitting: Rehabilitative and Restorative Service Providers"

## 2024-05-20 ENCOUNTER — Ambulatory Visit: Admitting: Rehabilitative and Restorative Service Providers"

## 2024-05-20 NOTE — Therapy (Deleted)
 OUTPATIENT PHYSICAL THERAPY VESTIBULAR TREATMENT   Patient Name: Rhonda Munoz MRN: [id number] DOB:February 15, 1956, 68 y.o., female Today's Date: 05/20/2024  END OF SESSION:    Past Medical History:  Diagnosis Date   Allergy    Cataract    Depression    GERD (gastroesophageal reflux disease)    Hemorrhoids    Hyperlipidemia    Past Surgical History:  Procedure Laterality Date   ABDOMINAL HYSTERECTOMY     CATARACT EXTRACTION, BILATERAL Bilateral 08/2022   second one 09/2022   CHOLECYSTECTOMY     EYE SURGERY  2024   Cataract   FINGER SURGERY Right    index finger x 1, middle finger x 2   GALLBLADDER SURGERY     LAPAROSCOPIC APPENDECTOMY N/A 02/10/2024   Procedure: APPENDECTOMY, LAPAROSCOPIC;  Surgeon: Signe Mitzie LABOR, MD;  Location: [facility];  Service: General;  Laterality: N/A;   TONSILLECTOMY     TUBAL LIGATION     Patient Active Problem List   Diagnosis Date Noted   Perforated appendicitis 02/12/2024   Acute appendicitis 02/10/2024   Lymphadenopathy 08/19/2023   Vulvovaginal candidiasis 07/27/2023   Thrush 07/27/2023   Cough 07/27/2023   Acute sinusitis 07/15/2023   Benign paroxysmal positional vertigo 01/04/2023   Yeast dermatitis 07/02/2022   Depression 06/05/2021   Osteopenia 04/26/2020   Hypercalcemia 01/02/2020   Hyperlipemia 01/02/2020   Prediabetes 01/02/2020   Postmenopausal 12/13/2019   Knee pain, left 09/28/2017   Gastroesophageal reflux disease with esophagitis 05/18/2017   History of colon polyps 05/18/2017    PCP: Velma Ku, DO REFERRING PROVIDER: Velma Ku, DO REFERRING DIAG: R42 (ICD-10-CM) - Vertigo   THERAPY DIAG:  No diagnosis found.  ONSET DATE: 03/22/24  Rationale for Evaluation and Treatment: Rehabilitation  SUBJECTIVE:   SUBJECTIVE STATEMENT: ***The patient has had ongoing dizziness since last session. Seated head turns are not bothering her, but rolling is still bothersome.   EVAL: The patient reports  vertigo returned 2 weeks ago while at her hair appointment when leaning back to have hair washed. She gets symptoms when rolling R and L. She feels dizziness when getting clothes out of the dryer, rolling in bed, and doing quick turns.  Pt accompanied by: self  PERTINENT HISTORY: recent appendectomy, hyperlipidemia  PAIN:  Are you having pain? No  PRECAUTIONS: None  WEIGHT BEARING RESTRICTIONS: No  FALLS: Has patient fallen in last 6 months? No  PATIENT GOALS: reduce dizziness  OBJECTIVE:  Note: Objective measures were completed at Evaluation unless otherwise noted. PATIENT SURVEYS:  DHI: THE DIZZINESS HANDICAP INVENTORY (DHI)=40% at eval;  P1. Does looking up increase your problem? 2 = Sometimes  E2. Because of your problem, do you feel frustrated? 4 = Yes  F3. Because of your problem, do you restrict your travel for business or recreation?  0 = No  P4. Does walking down the aisle of a supermarket increase your problems?  0 = No  F5. Because of your problem, do you have difficulty getting into or out of bed?  4 = Yes  F6. Does your problem significantly restrict your participation in social activities, such as going out to dinner, going to the movies, dancing, or going to parties? 0 = No  F7. Because of your problem, do you have difficulty reading?  4 = Yes  P8. Does performing more ambitious activities such as sports, dancing, household chores (sweeping or putting dishes away) increase your problems?  2 = Sometimes  E9. Because of your problem, are you afraid  to leave your home without having without having someone accompany you?  0 = No  E10. Because of your problem have you been embarrassed in front of others?  0 = No  P11. Do quick movements of your head increase your problem?  4 = Yes  F12. Because of your problem, do you avoid heights?  4 = Yes  P13. Does turning over in bed increase your problem?  4 = Yes  F14. Because of your problem, is it difficult for you to do strenuous  homework or yard work? 2 = Sometimes  E15. Because of your problem, are you afraid people may think you are intoxicated? 0 = No  F16. Because of your problem, is it difficult for you to go for a walk by yourself?  0 = No  P17. Does walking down a sidewalk increase your problem?  0 = No  E18.Because of your problem, is it difficult for you to concentrate 0 = No  F19. Because of your problem, is it difficult for you to walk around your house in the dark? 2 = Sometimes  E20. Because of your problem, are you afraid to stay home alone?  0 = No  E21. Because of your problem, do you feel handicapped? 0 = No  E22. Has the problem placed stress on your relationships with members of your family or friends? 0 = No  E23. Because of your problem, are you depressed?  0 = No  F24. Does your problem interfere with your job or household responsibilities?  2 = Sometimes  P25. Does bending over increase your problem?  4 = Yes  TOTAL 40%   VESTIBULAR ASSESSMENT: GENERAL OBSERVATION: The patient walks into clinic independently.   SYMPTOM BEHAVIOR:  Subjective history: h/o recurring vertigo, h/o multi-canal   Non-Vestibular symptoms: h/o visual reliance-- sensitive to ceiling fans or busy environments  Type of dizziness: Spinning/Vertigo and Unsteady with head/body turns  Frequency: daily  Duration: seconds to minutes  Aggravating factors: rolling R and L, bending forward, looking up  Relieving factors: head stationary  Progression of symptoms: better  OCULOMOTOR EXAM:  Ocular Alignment: normal  Ocular ROM: No Limitations  Spontaneous Nystagmus: absent  Gaze-Induced Nystagmus: absent  Smooth Pursuits: intact and sensation of unbalanced in head  Saccades: intact and no symptoms of imbalance with quick eye movements   VESTIBULAR - OCULAR REFLEX:   Slow VOR: Normal and Comment: sensation of eyes/head needing to catch up after stopping *did not test head impulse due to guarding (and therefore, we are  prescribing VOR as part of HEP regardless of result of head impulse testing    POSITIONAL TESTING: Right Dix-Hallpike: no nystagmus, however immediate sensation of spinning that lasts x 30 seconds Left Dix-Hallpike: upbeating, left nystagmus after a 12 second latency, then lasted for 45-60 seconds (so may be cupulolithiasis versus canalithiasis) Right Roll Test: no nystagmus Left Roll Test: apogeotropic nystagmus viewed greater using Alexander's law (in room light) TESTING INDICATES L posterior canalithiasis (time of nystagmus may be more indicative of cupulolithiasis) and R horizontal cupulolithiasis  FUNCTIONAL GAIT: TBA as indicated  [facility] Adult PT Treatment:                                                DATE: 05/20/24 Therapeutic Exercise: *** Manual Therapy: *** Neuromuscular re-ed: *** Therapeutic Activity: *** Gait: ***  Modalities: *** Self Care: ***  [facility] Adult PT Treatment:                                                DATE: 04/19/24 Neuromuscular re-ed: Positional Testing: L horizontal roll=negative x 2 times R horizontal roll=negative x 2 times L dix hallpike=positive for upbeating, rotary nystagmus R dix hallpike=negative Canolith Repositioning: L epley's maneuver=1 rep, rested to ensure nausea (feeling in parotid region) settles Retested and no nystagmus viewed in room light Ended session recommending rolling at home for functional tasks   [facility] Adult PT Treatment:                                                DATE: 04/13/24 Neuromuscular re-ed: Positional Testing: L dix hallpike=approximately 10 second latency, low amplitude nystagmus x 40+ seconds  R dix hallpike: approx 5 second latency, large amplitude nystagmus lasting 10 seconds L horizontal roll=negative for nystagmus R horizontal roll=negative for nystagmus or dizziness Retested after R Epley's maneuver PT noted direction change in nystagmus to a R beating horizontal nystagmus that was geotropic in  nature Tested R horizontal roll and L horizontal roll and initially saw low amplitude nystagmus with greater intensity on the left, so initiated treatment for L horizontal canlithiasis Canolith Repositioning: R Epley's maneuver x 1 rep (see retest information above after maneuver) L horizontal canolith repositioning  Began in L head rotation, then moved to supine, then moved to R roll *Patient developed severe, high amplitude nystagmus that were geotropic in nature in this position. She developed nausea and repositioning maneuver ended to allow for nausea to settle Self Care: Discussed home safety with continued multi-canal BPPV recommending Patient has zofran  from recent appendectomy-- PT to let her referring MD know she plans to take prn for nausea  [facility] Adult PT Treatment:                                                DATE: 04/04/24 Neuromuscular re-ed: DHI: 14%  Habituation sit<>sidelying and rolling Positional Testing Bilateral sidelying Wnls without dizziness or nystagmus Sidelying test= R rolling gets a short duration response with patient grabbing mat due to intense sensation of spinning (only on first rep) Gaze adaptation X 1 viewing vertical x 30 seconds X 1 viewing horizontal  x 30 seconds Self Care: Discussed optokinetic stimuli and how she modified her movement     PATIENT EDUCATION: Education details: nature of symptoms Person educated: Patient Education method: Explanation Education comprehension: verbalized understanding  HOME EXERCISE PROGRAM: Access Code: [email or web address] URL: [email or web address] Date: 04/04/2024 Prepared by: Tawni Ferrier  Exercises - Rolling rightleft sides for vestibular habituation  - 2 x daily - 7 x weekly - 1 sets - 5 reps - Brandt-Daroff Vestibular Exercise  - 2 x daily - 7 x weekly - 1 sets - 5 reps - Seated Head Nod Vestibular Habituation  - 2 x daily - 7 x weekly - 1 sets - 5 reps - Seated Gaze Stabilization with Head  Rotation  - 1 x daily - 7 x weekly - 1 sets - 10 reps -  Seated Gaze Stabilization with Head Nod  - 1 x daily - 7 x weekly - 1 sets - 10 reps  GOALS: Goals reviewed with patient? Yes  SHORT TERM GOALS: Target date: 04/24/24  The patient will be indep with initial HEP Baseline: initiated at eval Goal status: MET  2..   The patient will report no dizziness with bed mobility.  Baseline: Dizziness with rolling Goal status: MET  LONG TERM GOALS: Target date: 05/24/24  The patient will be indep with progression of HEP. Baseline:  initiated at eval Goal status: INITIAL  2.  The patient will improve DHI by 15% to demonstrate improved functional abilities. Baseline: 40% Goal status: MET  3.  The patient will have negative positional testing indicating resolution of BPPV. Baseline:  see above Goal status: INITIAL  4.  The patient will report self mgmt of BPPV due to chronic h/o vertigo. Baseline:   has h/o episodic vertigo Goal status: INITIAL  ASSESSMENT:  CLINICAL IMPRESSION: ***The patient has improvement today with positional testing with only mild nystagmus viewed with L dix hallpike. Functionally, she is still experiencing spinning at home with rolling in bed at night, but horizontal canal symptoms were not provoked during today's session. Plan to reassess and treat based on clinical presentation.   EVAL:Patient is a 68 y.o. female who was seen today for physical therapy evaluation and treatment for vertigo. She presents today with imapairments of decreased VOR (per symptoms with slow VOR), and multi-canal BPPV. PT initiated treatment today for R cupulolithiasis. She experienced severe spinning and nausea with return to sitting. Symptoms settled after resting x 5 minutes. PT to address deficits to return to baseline level of function.  OBJECTIVE IMPAIRMENTS: decreased activity tolerance, decreased balance, dizziness, and impaired vision/preception.   PLAN:  PT FREQUENCY:  1-2x/week  PT DURATION: 8 weeks  PLANNED INTERVENTIONS: 97164- PT Re-evaluation, 97750- Physical Performance Testing, 97110-Therapeutic exercises, 97530- Therapeutic activity, [id number]- Neuromuscular re-education, 97535- Self Care, [id number]- Manual therapy, [phone]- Gait training, [phone]- Canalith repositioning, Patient/Family education, Balance training, and Visual/preceptual remediation/compensation  PLAN FOR NEXT SESSION: Plan to recheck positional maneuvers-- treat as indicated.  ***   Sara Keys, PT 05/20/2024, 7:50 AM

## 2024-05-21 NOTE — Progress Notes (Signed)
Okay, done

## 2024-05-23 ENCOUNTER — Inpatient Hospital Stay (HOSPITAL_BASED_OUTPATIENT_CLINIC_OR_DEPARTMENT_OTHER): Admission: RE | Admit: 2024-05-23 | Source: Ambulatory Visit

## 2024-06-06 ENCOUNTER — Ambulatory Visit (HOSPITAL_BASED_OUTPATIENT_CLINIC_OR_DEPARTMENT_OTHER)

## 2024-06-06 ENCOUNTER — Ambulatory Visit

## 2024-06-07 ENCOUNTER — Ambulatory Visit

## 2024-06-07 VITALS — Ht 67.0 in | Wt 185.0 lb

## 2024-06-07 DIAGNOSIS — Z0000 Encounter for general adult medical examination without abnormal findings: Secondary | ICD-10-CM

## 2024-06-07 NOTE — Patient Instructions (Signed)
  Rhonda Munoz , Thank you for taking time to come for your Medicare Wellness Visit. I appreciate your ongoing commitment to your health goals. Please review the following plan we discussed and let me know if I can assist you in the future.   These are the goals we discussed:  Goals       Patient Stated (pt-stated)      Patient stated that she would like to loose weight.      Patient Stated      Patient states she would like to lose another 15 lbs.         This is a list of the screening recommended for you and due dates:  Health Maintenance  Topic Date Due   HIV Screening  Never done   Hepatitis C Screening  Never done   Flu Shot  03/25/2024   COVID-19 Vaccine (4 - 2025-26 season) 04/25/2024   DTaP/Tdap/Td vaccine (3 - Td or Tdap) 01/12/2025   Breast Cancer Screening  05/19/2025   Medicare Annual Wellness Visit  06/07/2025   DEXA scan (bone density measurement)  04/21/2026   Colon Cancer Screening  05/21/2028   Pneumococcal Vaccine for age over 30  Completed   Zoster (Shingles) Vaccine  Completed   Hepatitis B Vaccine  Aged Out   Meningitis B Vaccine  Aged Out

## 2024-06-07 NOTE — Progress Notes (Signed)
 Subjective:   Rhonda Munoz is a 68 y.o. female who presents for Medicare Annual (Subsequent) preventive examination.  Visit Complete: Virtual I connected with  Rhonda Munoz on 06/07/24 by a audio enabled telemedicine application and verified that I am speaking with the correct person using two identifiers.  Patient Location: Home  Provider Location: Office/Clinic  I discussed the limitations of evaluation and management by telemedicine. The patient expressed understanding and agreed to proceed.  Vital Signs: Because this visit was a virtual/telehealth visit, some criteria may be missing or patient reported. Any vitals not documented were not able to be obtained and vitals that have been documented are patient reported.  Patient Medicare AWV questionnaire was completed by the patient on 05/31/2024; I have confirmed that all information answered by patient is correct and no changes since this date.  Cardiac Risk Factors include: dyslipidemia;obesity (BMI >30kg/m2);family history of premature cardiovascular disease     Objective:    Today's Vitals   06/07/24 1601  Weight: 185 lb (83.9 kg)  Height: 5' 7 (1.702 m)   Body mass index is 28.98 kg/m.     06/07/2024    4:13 PM 03/25/2024   10:28 AM 02/10/2024    5:34 PM 02/10/2024    8:41 AM 06/01/2023    2:57 PM 05/11/2023   10:20 AM 01/05/2023    9:48 AM  Advanced Directives  Does Patient Have a Medical Advance Directive? Yes Yes Yes Yes Yes Yes Yes  Type of Estate agent of [place];Living will Healthcare Power of [place];Living will Living will;Healthcare Power of Attorney Living will;Healthcare Power of Attorney Living will;Healthcare Power of Attorney    Does patient want to make changes to medical advance directive? No - Patient declined  No - Patient declined  No - Patient declined    Copy of Healthcare Power of Attorney in Chart?   No - copy requested  No - copy requested       Current Medications (verified) Outpatient Encounter Medications as of 06/07/2024  Medication Sig   acetaminophen  (TYLENOL ) 500 MG tablet Take 2 tablets (1,000 mg total) by mouth every 6 (six) hours as needed for mild pain (pain score 1-3), fever or headache.   ascorbic acid (VITAMIN C) 500 MG tablet Take 500 mg by mouth daily.   aspirin  81 MG EC tablet Take 81 mg by mouth daily.   Calcium Carbonate-Vitamin D  (CALCIUM 600 +D HIGH POTENCY PO) Take 600 mg by mouth daily.   desvenlafaxine  (PRISTIQ ) 50 MG 24 hr tablet Take 1 tablet (50 mg total) by mouth daily.   Magnesium Glycinate 100 MG CAPS Take 100 mg by mouth at bedtime.   Multiple Vitamins-Minerals (DAILY MULTIVITAMIN PO) Take 2 tablets by mouth daily.   pantoprazole  (PROTONIX ) 40 MG tablet TAKE 1 TABLET DAILY   pravastatin  (PRAVACHOL ) 20 MG tablet TAKE 1 TABLET EVERY EVENING   Zinc 50 MG TABS Take 50 mg by mouth daily.   No facility-administered encounter medications on file as of 06/07/2024.    Allergies (verified) Clenpiq  [sod picosulfate-mag ox-cit acd], Effexor [venlafaxine], Codeine, Hydromorphone, and Procaine   History: Past Medical History:  Diagnosis Date   Allergy    Cataract    Depression    GERD (gastroesophageal reflux disease)    Hemorrhoids    Hyperlipidemia    Past Surgical History:  Procedure Laterality Date   ABDOMINAL HYSTERECTOMY     CATARACT EXTRACTION, BILATERAL Bilateral 08/2022   second one 09/2022   CHOLECYSTECTOMY  EYE SURGERY  2024   Cataract   FINGER SURGERY Right    index finger x 1, middle finger x 2   GALLBLADDER SURGERY     LAPAROSCOPIC APPENDECTOMY N/A 02/10/2024   Procedure: APPENDECTOMY, LAPAROSCOPIC;  Surgeon: Signe Mitzie LABOR, MD;  Location: [facility];  Service: General;  Laterality: N/A;   TONSILLECTOMY     TUBAL LIGATION     Family History  Problem Relation Age of Onset   Breast cancer Mother    Hypertension Mother    Stroke Mother    Colon polyps Mother    Arthritis  Mother    Asthma Mother    Cancer Mother    Hearing loss Mother    Miscarriages / [place] Mother    Obesity Mother    Diabetes Father    Heart attack Father    Heart disease Father    Hypertension Father    Hyperlipidemia Father    Arthritis Father    Obesity Father    Varicose Veins Father    Cancer Sister    Colon cancer Sister    Heart failure Maternal Grandmother    Colon cancer Maternal Grandfather    Heart failure Paternal Grandmother    Cancer Sister    Obesity Sister    Esophageal cancer Neg Hx    Rectal cancer Neg Hx    Stomach cancer Neg Hx    Social History   Socioeconomic History   Marital status: Widowed    Spouse name: Not on file   Number of children: 2   Years of education: 14   Highest education level: Associate degree: academic program  Occupational History   Occupation: Retired  Tobacco Use   Smoking status: Never   Smokeless tobacco: Never  Vaping Use   Vaping status: Never Used  Substance and Sexual Activity   Alcohol use: Yes    Comment: rarely   Drug use: Never   Sexual activity: Not Currently    Partners: Male    Birth control/protection: Post-menopausal, Surgical  Other Topics Concern   Not on file  Social History Narrative   Lives with her dog. She has two sons. She enjoys reading, social gatherings at church and spending time with her grandchildren. Her sons live in [place].   Social Drivers of Corporate investment banker Strain: Low Risk  (06/07/2024)   Overall Financial Resource Strain (CARDIA)    Difficulty of Paying Living Expenses: Not hard at all  Food Insecurity: No Food Insecurity (06/07/2024)   Hunger Vital Sign    Worried About Running Out of Food in the Last Year: Never true    Ran Out of Food in the Last Year: Never true  Transportation Needs: No Transportation Needs (06/07/2024)   PRAPARE - Administrator, Civil Service (Medical): No    Lack of Transportation (Non-Medical): No  Physical  Activity: Insufficiently Active (06/07/2024)   Exercise Vital Sign    Days of Exercise per Week: 3 days    Minutes of Exercise per Session: 20 min  Stress: No Stress Concern Present (06/07/2024)   [employer] of Occupational Health - Occupational Stress Questionnaire    Feeling of Stress: Only a little  Social Connections: Moderately Integrated (06/07/2024)   Social Connection and Isolation Panel    Frequency of Communication with Friends and Family: More than three times a week    Frequency of Social Gatherings with Friends and Family: Twice a week    Attends Religious  Services: More than 4 times per year    Active Member of Clubs or Organizations: Yes    Attends Banker Meetings: More than 4 times per year    Marital Status: Widowed    Tobacco Counseling Counseling given: Not Answered   Clinical Intake:  Pre-visit preparation completed: Yes  Pain : No/denies pain     BMI - recorded: 28.98 Nutritional Status: BMI 25 -29 Overweight Nutritional Risks: None Diabetes: No  How often do you need to have someone help you when you read instructions, pamphlets, or other written materials from your doctor or pharmacy?: 1 - Never What is the last grade level you completed in school?: 14  Interpreter Needed?: No      Activities of Daily Living    06/07/2024    4:02 PM 05/31/2024    2:05 PM  In your present state of health, do you have any difficulty performing the following activities:  Hearing? 0 0  Vision? 0 0  Difficulty concentrating or making decisions? 0 0  Walking or climbing stairs? 0 0  Dressing or bathing? 0 0  Doing errands, shopping? 0 0  Preparing Food and eating ? N N  Using the Toilet? N N  In the past six months, have you accidently leaked urine? N N  Do you have problems with loss of bowel control? N N  Managing your Medications? N N  Managing your Finances? N N  Housekeeping or managing your Housekeeping? N N    Patient Care  Team: Alvia Bring, DO as PCP - General (Family Medicine) Charmayne Molly, MD as Consulting Physician (Ophthalmology)  Indicate any recent Medical Services you may have received from other than [facility] providers in the past year (date may be approximate).     Assessment:   This is a routine wellness examination for Rhonda Munoz.  Hearing/Vision screen No results found.   Goals Addressed             This Visit's Progress    Patient Stated       Patient states she would like to lose another 15 lbs.        Depression Screen    06/07/2024    4:11 PM 11/16/2023    1:51 PM 11/16/2023    1:34 PM 07/17/2023    4:36 PM 06/01/2023    3:00 PM 07/02/2022    1:56 PM 02/26/2022    4:06 PM  PHQ 2/9 Scores  PHQ - 2 Score 0 0 0 0 0 0 0  PHQ- 9 Score  0  2  1 1     Fall Risk    06/07/2024    4:12 PM 05/31/2024    2:05 PM 03/22/2024    1:48 PM 11/16/2023    1:34 PM 08/17/2023    3:32 PM  Fall Risk   Falls in the past year? 0 0 0 0 0  Number falls in past yr: 0  0 0 0  Injury with Fall? 0  0 0 0  Risk for fall due to : No Fall Risks  No Fall Risks No Fall Risks No Fall Risks  Follow up Falls evaluation completed  Falls evaluation completed Falls evaluation completed Falls evaluation completed    MEDICARE RISK AT HOME: Medicare Risk at Home Any stairs in or around the home?: No Home free of loose throw rugs in walkways, pet beds, electrical cords, etc?: Yes Adequate lighting in your home to reduce risk of falls?: Yes Life alert?: No  Use of a cane, walker or w/c?: No Grab bars in the bathroom?: Yes Shower chair or bench in shower?: Yes Elevated toilet seat or a handicapped toilet?: No  TIMED UP AND GO:  Was the test performed?  No    Cognitive Function:        06/07/2024    4:13 PM 06/01/2023    3:13 PM 05/26/2022   10:37 AM  6CIT Screen  What Year? 0 points 0 points 0 points  What month? 0 points 0 points 0 points  What time? 0 points 0 points 0 points  Count back from 20 0  points 0 points 0 points  Months in reverse 0 points 0 points 0 points  Repeat phrase 2 points 4 points 0 points  Total Score 2 points 4 points 0 points    Immunizations Immunization History  Administered Date(s) Administered   Fluad Quad(high Dose 65+) 07/02/2022   Influenza, Seasonal, Injecte, Preservative Fre 06/08/2023   Influenza,inj,Quad PF,6+ Mos 06/06/2019, 07/01/2021   Influenza-Unspecified 05/10/2020   PFIZER(Purple Top)SARS-COV-2 Vaccination 11/18/2019, 12/21/2019, 10/05/2020   PNEUMOCOCCAL CONJUGATE-20 08/12/2023   Tdap 11/24/2014, 01/13/2015   Zoster Recombinant(Shingrix) 02/07/2020, 05/10/2020    TDAP status: Up to date  Flu Vaccine status: Due, Education has been provided regarding the importance of this vaccine. Advised may receive this vaccine at local pharmacy or Health Dept. Aware to provide a copy of the vaccination record if obtained from local pharmacy or Health Dept. Verbalized acceptance and understanding.  Pneumococcal vaccine status: Up to date  Covid-19 vaccine status: Declined, Education has been provided regarding the importance of this vaccine but patient still declined. Advised may receive this vaccine at local pharmacy or Health Dept.or vaccine clinic. Aware to provide a copy of the vaccination record if obtained from local pharmacy or Health Dept. Verbalized acceptance and understanding.  Qualifies for Shingles Vaccine? Yes   Zostavax completed No   Shingrix Completed?: Yes  Screening Tests Health Maintenance  Topic Date Due   HIV Screening  Never done   Hepatitis C Screening  Never done   Influenza Vaccine  03/25/2024   COVID-19 Vaccine (4 - 2025-26 season) 04/25/2024   DTaP/Tdap/Td (3 - Td or Tdap) 01/12/2025   Mammogram  05/19/2025   Medicare Annual Wellness (AWV)  06/07/2025   DEXA SCAN  04/21/2026   Colonoscopy  05/21/2028   Pneumococcal Vaccine: 50+ Years  Completed   Zoster Vaccines- Shingrix  Completed   Hepatitis B Vaccines 19-59  Average Risk  Aged Out   Meningococcal B Vaccine  Aged Out    Health Maintenance  Health Maintenance Due  Topic Date Due   HIV Screening  Never done   Hepatitis C Screening  Never done   Influenza Vaccine  03/25/2024   COVID-19 Vaccine (4 - 2025-26 season) 04/25/2024    Colorectal cancer screening: Type of screening: Colonoscopy. Completed 05/22/2023. Repeat every 5 years  Mammogram status: Completed 05/20/2023. Repeat every year  Bone Density status: Completed 04/22/2023. Results reflect: Bone density results: OSTEOPENIA. Repeat every 3 years.  Lung Cancer Screening: (Low Dose CT Chest recommended if Age 72-80 years, 20 pack-year currently smoking OR have quit w/in 15years.) does not qualify.   Lung Cancer Screening Referral: n/a  Additional Screening:  Hepatitis C Screening: does qualify; Completed   Vision Screening: Recommended annual ophthalmology exams for early detection of glaucoma and other disorders of the eye. Is the patient up to date with their annual eye exam?  Yes  Who is the provider  or what is the name of the office in which the patient attends annual eye exams? Dr Charmayne If pt is not established with a provider, would they like to be referred to a provider to establish care? N/a.   Dental Screening: Recommended annual dental exams for proper oral hygiene   Community Resource Referral / Chronic Care Management: CRR required this visit?  No   CCM required this visit?  No     Plan:     I have personally reviewed and noted the following in the patient's chart:   Medical and social history Use of alcohol, tobacco or illicit drugs  Current medications and supplements including opioid prescriptions. Patient is not currently taking opioid prescriptions. Functional ability and status Nutritional status Physical activity Advanced directives List of other physicians Hospitalizations # 1, surgeries # 1, and ER # 1 visits in previous 12  months Vitals Screenings to include cognitive, depression, and falls Referrals and appointments  In addition, I have reviewed and discussed with patient certain preventive protocols, quality metrics, and best practice recommendations. A written personalized care plan for preventive services as well as general preventive health recommendations were provided to patient.     Rhonda Munoz, Rhonda Munoz   06/07/2024   After Visit Summary: (MyChart) Due to this being a telephonic visit, the after visit summary with patients personalized plan was offered to patient via MyChart   Nurse Notes:   Rhonda Munoz is a 68 y.o. female patient of Alvia Bring, DO who had a Medicare Annual Wellness Visit today. Rhonda Munoz is Retired and lives alone. She has 2 children. She reports that she is socially active and does interact with friends/family regularly. She is moderately physically active and enjoys reading, social gatherings at church and spending time with her grandchildren.

## 2024-06-13 ENCOUNTER — Other Ambulatory Visit (HOSPITAL_BASED_OUTPATIENT_CLINIC_OR_DEPARTMENT_OTHER): Payer: Self-pay

## 2024-06-13 ENCOUNTER — Encounter: Payer: Self-pay | Admitting: Family Medicine

## 2024-06-13 ENCOUNTER — Ambulatory Visit (INDEPENDENT_AMBULATORY_CARE_PROVIDER_SITE_OTHER): Admitting: Family Medicine

## 2024-06-13 VITALS — BP 107/76 | HR 73 | Temp 98.3°F | Ht 67.0 in | Wt 191.0 lb

## 2024-06-13 DIAGNOSIS — J0100 Acute maxillary sinusitis, unspecified: Secondary | ICD-10-CM | POA: Insufficient documentation

## 2024-06-13 MED ORDER — DOXYCYCLINE HYCLATE 100 MG PO TABS
100.0000 mg | ORAL_TABLET | Freq: Two times a day (BID) | ORAL | 0 refills | Status: DC
  Filled 2024-06-13: qty 20, 10d supply, fill #0

## 2024-06-13 MED ORDER — BENZONATATE 200 MG PO CAPS
200.0000 mg | ORAL_CAPSULE | Freq: Two times a day (BID) | ORAL | 0 refills | Status: DC | PRN
  Filled 2024-06-13: qty 20, 10d supply, fill #0

## 2024-06-13 MED ORDER — DOXYCYCLINE HYCLATE 100 MG PO TABS: 100.0000 mg | ORAL_TABLET | Freq: Two times a day (BID) | ORAL | 0 refills | Status: DC

## 2024-06-13 NOTE — Progress Notes (Signed)
 Rhonda Munoz - 68 y.o. female MRN [id number]  Date of birth: [birth year]  Subjective Chief Complaint  Patient presents with   Nasal Congestion    HPI Rhonda Munoz is a 68 year old female here today with complaint of bilateral maxillary sinus pain, postnasal drainage and cough.  She did have COVID-19 a little over a month ago.  These symptoms developed about a little over a week ago and reports an onset.  She does have cough that is productive of thick, yellowish sputum.  Feels like this is from postnasal drainage.  She denies wheezing or dyspnea.  She has not had fever or chills.  ROS:  A comprehensive ROS was completed and negative except as noted per HPI  Allergies  Allergen Reactions   Clenpiq  [Sod Picosulfate-Mag Ox-Cit Acd] Other (See Comments)    Sweating, vomiting and bad headache   Effexor [Venlafaxine] Other (See Comments)    VISUAL HALLUCINATIONS   Codeine Nausea Only and Rash   Hydromorphone Rash    Tachycardia   Procaine Nausea Only and Rash    Past Medical History:  Diagnosis Date   Allergy    Cataract    Surgery   Depression    GERD (gastroesophageal reflux disease)    Hemorrhoids    Hyperlipidemia     Past Surgical History:  Procedure Laterality Date   ABDOMINAL HYSTERECTOMY     APPENDECTOMY  02/10/2024   CATARACT EXTRACTION, BILATERAL Bilateral 08/2022   second one 09/2022   CHOLECYSTECTOMY     EYE SURGERY  2024   Cataract   FINGER SURGERY Right    index finger x 1, middle finger x 2   GALLBLADDER SURGERY     JOINT REPLACEMENT     DIP JOINT FUSION   LAPAROSCOPIC APPENDECTOMY N/A 02/10/2024   Procedure: APPENDECTOMY, LAPAROSCOPIC;  Surgeon: Signe Mitzie LABOR, MD;  Location: [facility];  Service: General;  Laterality: N/A;   TONSILLECTOMY     TUBAL LIGATION      Social History   Socioeconomic History   Marital status: Widowed    Spouse name: Not on file   Number of children: 2   Years of education: 14   Highest education  level: Associate degree: academic program  Occupational History   Occupation: Retired  Tobacco Use   Smoking status: Never   Smokeless tobacco: Never  Vaping Use   Vaping status: Never Used  Substance and Sexual Activity   Alcohol use: Yes    Comment: rarely   Drug use: Never   Sexual activity: Not Currently    Partners: Male    Birth control/protection: Post-menopausal, Surgical  Other Topics Concern   Not on file  Social History Narrative   Lives with her dog. She has two sons. She enjoys reading, social gatherings at church and spending time with her grandchildren. Her sons live in [place].   Social Drivers of Corporate investment banker Strain: Low Risk  (06/07/2024)   Overall Financial Resource Strain (CARDIA)    Difficulty of Paying Living Expenses: Not hard at all  Food Insecurity: No Food Insecurity (06/07/2024)   Hunger Vital Sign    Worried About Running Out of Food in the Last Year: Never true    Ran Out of Food in the Last Year: Never true  Transportation Needs: No Transportation Needs (06/07/2024)   PRAPARE - Administrator, Civil Service (Medical): No    Lack of Transportation (Non-Medical): No  Physical Activity: Insufficiently Active (  06/07/2024)   Exercise Vital Sign    Days of Exercise per Week: 3 days    Minutes of Exercise per Session: 20 min  Stress: No Stress Concern Present (06/07/2024)   [employer] of Occupational Health - Occupational Stress Questionnaire    Feeling of Stress: Only a little  Social Connections: Moderately Integrated (06/07/2024)   Social Connection and Isolation Panel    Frequency of Communication with Friends and Family: More than three times a week    Frequency of Social Gatherings with Friends and Family: Twice a week    Attends Religious Services: More than 4 times per year    Active Member of [employer] or Organizations: Yes    Attends Banker Meetings: More than 4 times per year     Marital Status: Widowed    Family History  Problem Relation Age of Onset   Breast cancer Mother    Hypertension Mother    Stroke Mother    Colon polyps Mother    Arthritis Mother    Asthma Mother    Cancer Mother    Hearing loss Mother    Miscarriages / Stillbirths Mother    Obesity Mother    Diabetes Father    Heart attack Father    Heart disease Father    Hypertension Father    Hyperlipidemia Father    Arthritis Father    Obesity Father    Varicose Veins Father    Cancer Sister    Colon cancer Sister    Heart failure Maternal Grandmother    Colon cancer Maternal Grandfather    Heart failure Paternal Grandmother    Cancer Sister    Obesity Sister    Esophageal cancer Neg Hx    Rectal cancer Neg Hx    Stomach cancer Neg Hx     Health Maintenance  Topic Date Due   HIV Screening  Never done   Hepatitis C Screening  Never done   Influenza Vaccine  03/25/2024   COVID-19 Vaccine (4 - 2025-26 season) 04/25/2024   DTaP/Tdap/Td (3 - Td or Tdap) 01/12/2025   Mammogram  05/19/2025   Medicare Annual Wellness (AWV)  06/07/2025   DEXA SCAN  04/21/2026   Colonoscopy  05/21/2028   Pneumococcal Vaccine: 50+ Years  Completed   Zoster Vaccines- Shingrix  Completed   Hepatitis B Vaccines 19-59 Average Risk  Aged Out   Meningococcal B Vaccine  Aged Out     ----------------------------------------------------------------------------------------------------------------------------------------------------------------------------------------------------------------- Physical Exam BP 107/76 (BP Location: Left Arm, Patient Position: Sitting, Cuff Size: Large)   Pulse 73   Temp 98.3 F (36.8 C) (Oral)   Ht 5' 7 (1.702 m)   Wt 191 lb (86.6 kg)   SpO2 100%   BMI 29.91 kg/m   Physical Exam Constitutional:      Appearance: Normal appearance.  Eyes:     General: No scleral icterus. Cardiovascular:     Rate and Rhythm: Normal rate and regular rhythm.  Pulmonary:     Effort:  Pulmonary effort is normal.     Breath sounds: Normal breath sounds.  Musculoskeletal:     Cervical back: Neck supple.  Neurological:     Mental Status: She is alert.  Psychiatric:        Mood and Affect: Mood normal.        Behavior: Behavior normal.     ------------------------------------------------------------------------------------------------------------------------------------------------------------------------------------------------------------------- Assessment and Plan  Acute maxillary sinusitis Treated with course of doxycycline.  She prefers to not add steroids on at this  time.  Tessalon  Perles sent in to help with cough.  Red flags reviewed.  Contact clinic if not improving.   Meds ordered this encounter  Medications   DISCONTD: doxycycline (VIBRA-TABS) 100 MG tablet    Sig: Take 1 tablet (100 mg total) by mouth 2 (two) times daily.    Dispense:  20 tablet    Refill:  0   doxycycline (VIBRA-TABS) 100 MG tablet    Sig: Take 1 tablet (100 mg total) by mouth 2 (two) times daily.    Dispense:  20 tablet    Refill:  0   benzonatate  (TESSALON ) 200 MG capsule    Sig: Take 1 capsule (200 mg total) by mouth 2 (two) times daily as needed for cough.    Dispense:  20 capsule    Refill:  0    No follow-ups on file.

## 2024-06-13 NOTE — Patient Instructions (Signed)

## 2024-06-13 NOTE — Assessment & Plan Note (Signed)
 Treated with course of doxycycline.  She prefers to not add steroids on at this time.  Tessalon  Perles sent in to help with cough.  Red flags reviewed.  Contact clinic if not improving.

## 2024-06-14 ENCOUNTER — Ambulatory Visit (HOSPITAL_BASED_OUTPATIENT_CLINIC_OR_DEPARTMENT_OTHER)

## 2024-06-20 ENCOUNTER — Ambulatory Visit (INDEPENDENT_AMBULATORY_CARE_PROVIDER_SITE_OTHER)

## 2024-06-20 DIAGNOSIS — Z23 Encounter for immunization: Secondary | ICD-10-CM | POA: Diagnosis not present

## 2024-06-23 ENCOUNTER — Other Ambulatory Visit (HOSPITAL_BASED_OUTPATIENT_CLINIC_OR_DEPARTMENT_OTHER): Payer: Self-pay

## 2024-07-04 ENCOUNTER — Ambulatory Visit (HOSPITAL_BASED_OUTPATIENT_CLINIC_OR_DEPARTMENT_OTHER)

## 2024-07-12 ENCOUNTER — Ambulatory Visit (HOSPITAL_BASED_OUTPATIENT_CLINIC_OR_DEPARTMENT_OTHER)
Admission: RE | Admit: 2024-07-12 | Discharge: 2024-07-12 | Disposition: A | Discharge status: Home / Self Care | Source: Ambulatory Visit | Attending: Family Medicine | Admitting: Family Medicine

## 2024-07-12 ENCOUNTER — Encounter (HOSPITAL_BASED_OUTPATIENT_CLINIC_OR_DEPARTMENT_OTHER): Payer: Self-pay

## 2024-07-12 DIAGNOSIS — Z1231 Encounter for screening mammogram for malignant neoplasm of breast: Secondary | ICD-10-CM | POA: Insufficient documentation

## 2024-07-15 ENCOUNTER — Encounter: Payer: Self-pay | Admitting: Family Medicine

## 2024-07-18 ENCOUNTER — Other Ambulatory Visit: Payer: Self-pay | Admitting: Family Medicine

## 2024-07-18 DIAGNOSIS — R928 Other abnormal and inconclusive findings on diagnostic imaging of breast: Secondary | ICD-10-CM

## 2024-07-29 ENCOUNTER — Telehealth: Payer: Self-pay

## 2024-07-29 NOTE — Telephone Encounter (Signed)
 Copied from CRM [id number]. Topic: Referral - Request for Referral >> Jul 29, 2024  1:47 PM Emylou G wrote: Did the patient discuss referral with their provider in the last year? Yes (If No - schedule appointment) (If Yes - send message)  Appointment offered? No  Type of order/referral and detailed reason for visit: vertigo  Preference of office, provider, location: in our building  If referral order, have you been seen by this specialty before? Yes (If Yes, this issue or another issue? When? Where?  Can we respond through MyChart? Yes

## 2024-08-02 ENCOUNTER — Encounter: Payer: Self-pay | Admitting: Family Medicine

## 2024-08-02 DIAGNOSIS — R42 Dizziness and giddiness: Secondary | ICD-10-CM

## 2024-08-02 NOTE — Telephone Encounter (Signed)
 Copied from CRM [id number]. Topic: Referral - Question >> Aug 02, 2024 10:48 AM Rhonda Munoz wrote: Reason for CRM: Following up on referral request to PT - states she wants to be referred to [facility].Please advise # [phone]

## 2024-08-02 NOTE — Telephone Encounter (Signed)
Request sent to provider in previous encounter.

## 2024-08-02 NOTE — Telephone Encounter (Signed)
 Pended referral to PT as requested by patient.

## 2024-08-04 ENCOUNTER — Ambulatory Visit
Admission: RE | Admit: 2024-08-04 | Discharge: 2024-08-04 | Disposition: A | Discharge status: Home / Self Care | Source: Ambulatory Visit | Attending: Family Medicine | Admitting: Family Medicine

## 2024-08-04 DIAGNOSIS — R928 Other abnormal and inconclusive findings on diagnostic imaging of breast: Secondary | ICD-10-CM

## 2024-08-08 ENCOUNTER — Ambulatory Visit: Admitting: Rehabilitative and Restorative Service Providers"

## 2024-08-08 ENCOUNTER — Ambulatory Visit: Admitting: Physical Therapy

## 2024-08-11 ENCOUNTER — Other Ambulatory Visit: Payer: Self-pay

## 2024-08-11 ENCOUNTER — Ambulatory Visit: Attending: Family Medicine | Admitting: Rehabilitative and Restorative Service Providers"

## 2024-08-11 ENCOUNTER — Other Ambulatory Visit: Payer: Self-pay | Admitting: Family Medicine

## 2024-08-11 ENCOUNTER — Encounter: Payer: Self-pay | Admitting: Rehabilitative and Restorative Service Providers"

## 2024-08-11 DIAGNOSIS — R42 Dizziness and giddiness: Secondary | ICD-10-CM | POA: Diagnosis present

## 2024-08-11 DIAGNOSIS — H8111 Benign paroxysmal vertigo, right ear: Secondary | ICD-10-CM | POA: Insufficient documentation

## 2024-08-11 DIAGNOSIS — H8112 Benign paroxysmal vertigo, left ear: Secondary | ICD-10-CM | POA: Diagnosis present

## 2024-08-11 MED ORDER — ONDANSETRON 4 MG PO TBDP: 4.0000 mg | ORAL_TABLET | Freq: Three times a day (TID) | ORAL | 0 refills | Status: AC | PRN

## 2024-08-11 MED ORDER — MECLIZINE HCL 25 MG PO TABS: 25.0000 mg | ORAL_TABLET | Freq: Three times a day (TID) | ORAL | 0 refills | Status: DC | PRN

## 2024-08-11 NOTE — Progress Notes (Deleted)
 OUTPATIENT PHYSICAL THERAPY VESTIBULAR EVALUATION     Patient Name: Rhonda Munoz MRN: [id number] DOB:1955-11-24, 68 y.o., female Today's Date: 08/11/2024  END OF SESSION:   Past Medical History:  Diagnosis Date   Allergy    Cataract    Surgery   Depression    GERD (gastroesophageal reflux disease)    Hemorrhoids    Hyperlipidemia    Past Surgical History:  Procedure Laterality Date   ABDOMINAL HYSTERECTOMY     APPENDECTOMY  02/10/2024   CATARACT EXTRACTION, BILATERAL Bilateral 08/2022   second one 09/2022   CHOLECYSTECTOMY     EYE SURGERY  2024   Cataract   FINGER SURGERY Right    index finger x 1, middle finger x 2   GALLBLADDER SURGERY     JOINT REPLACEMENT     DIP JOINT FUSION   LAPAROSCOPIC APPENDECTOMY N/A 02/10/2024   Procedure: APPENDECTOMY, LAPAROSCOPIC;  Surgeon: Signe Mitzie LABOR, MD;  Location: [facility];  Service: General;  Laterality: N/A;   TONSILLECTOMY     TUBAL LIGATION     Patient Active Problem List   Diagnosis Date Noted   Acute maxillary sinusitis 06/13/2024   Perforated appendicitis 02/12/2024   Acute appendicitis 02/10/2024   Lymphadenopathy 08/19/2023   Vulvovaginal candidiasis 07/27/2023   Thrush 07/27/2023   Cough 07/27/2023   Benign paroxysmal positional vertigo 01/04/2023   Yeast dermatitis 07/02/2022   Depression 06/05/2021   Osteopenia 04/26/2020   Hypercalcemia 01/02/2020   Hyperlipemia 01/02/2020   Prediabetes 01/02/2020   Postmenopausal 12/13/2019   Knee pain, left 09/28/2017   Gastroesophageal reflux disease with esophagitis 05/18/2017   History of colon polyps 05/18/2017    PCP: *** REFERRING PROVIDER: ***  REFERRING DIAG: ***  THERAPY DIAG:  No diagnosis found.  ONSET DATE: ***  Rationale for Evaluation and Treatment: Rehabilitation  SUBJECTIVE:   SUBJECTIVE STATEMENT: *** Pt accompanied by: {accompnied:27141}  PERTINENT HISTORY: ***  PAIN:  Are you having pain?  {OPRCPAIN:27236}  PRECAUTIONS: {Therapy precautions:24002}  RED FLAGS: {PT Red Flags:29287}   WEIGHT BEARING RESTRICTIONS: {Yes ***/No:24003}  FALLS: Has patient fallen in last 6 months? {fallsyesno:27318}  LIVING ENVIRONMENT: Lives with: {[facility] lives with:25569::lives with their family} Lives in: {Lives in:25570} Stairs: {opstairs:27293} Has following equipment at home: {Assistive devices:23999}  PLOF: {PLOF:24004}  PATIENT GOALS: ***  OBJECTIVE:  Note: Objective measures were completed at Evaluation unless otherwise noted.  DIAGNOSTIC FINDINGS: ***  COGNITION: Overall cognitive status: {cognition:24006}   SENSATION: {sensation:27233}  EDEMA:  {edema:24020}  MUSCLE TONE:  {LE tone:25568}  DTRs:  {DTR SITE:24025}  POSTURE:  {posture:25561}  Cervical ROM:    {AROM/PROM:27142} A/PROM (deg) eval  Flexion   Extension   Right lateral flexion   Left lateral flexion   Right rotation   Left rotation   (Blank rows = not tested)  STRENGTH: ***  LOWER EXTREMITY MMT:   MMT Right eval Left eval  Hip flexion    Hip abduction    Hip adduction    Hip internal rotation    Hip external rotation    Knee flexion    Knee extension    Ankle dorsiflexion    Ankle plantarflexion    Ankle inversion    Ankle eversion    (Blank rows = not tested)  BED MOBILITY:  {Bed mobility:24027}  TRANSFERS: Assistive device utilized: {Assistive devices:23999}  Sit to stand: {Levels of assistance:24026} Stand to sit: {Levels of assistance:24026} Chair to chair: {Levels of assistance:24026} Floor: {Levels of assistance:24026}  RAMP: {Levels of assistance:24026}  CURB: {Levels of assistance:24026}  GAIT: Gait pattern: {gait characteristics:25376} Distance walked: *** Assistive device utilized: {Assistive devices:23999} Level of assistance: {Levels of assistance:24026} Comments: ***  FUNCTIONAL TESTS:  {Functional tests:24029}  PATIENT SURVEYS:  {rehab  surveys:24030}  VESTIBULAR ASSESSMENT:  GENERAL OBSERVATION: ***   SYMPTOM BEHAVIOR:  Subjective history: ***  Non-Vestibular symptoms: {nonvestibular symptoms:25260}  Type of dizziness: {Type of Dizziness:25255}  Frequency: ***  Duration: ***  Aggravating factors: {Aggravating Factors:25258}  Relieving factors: {Relieving Factors:25259}  Progression of symptoms: {DESC; BETTER/WORSE:18575}  OCULOMOTOR EXAM:  Ocular Alignment: {Ocular Alignment:25262}  Ocular ROM: {RANGE OF MOTION:21649}  Spontaneous Nystagmus: {Spontaneous nystagmus:25263}  Gaze-Induced Nystagmus: {gaze-induced nystagmus:25264}  Smooth Pursuits: {smooth pursuit:25265}  Saccades: {saccades:25266}  Convergence/Divergence: *** cm   Cover-cross-cover test: {cover test:33756}  FRENZEL - FIXATION SUPRESSED:  Ocular Alignment: {Ocular Alignment:25262}  Spontaneous Nystagmus: {Spontaneous nystagmus:25263}  Gaze-Induced Nystagmus: {gaze-induced nystagmus:25264}  Horizontal head shaking - induced nystagmus: {head shaking induced nystagmus:25267}  Vertical head shaking - induced nystagmus: {head shaking induced nystagmus:25267}  Positional tests: {Positional tests:25271}  Pressure tests: {frenzel pressure tests:25268}  VESTIBULAR - OCULAR REFLEX:   Slow VOR: {slow VOR:25290}  VOR Cancellation: {vor cancellation:25291}  Head-Impulse Test: {head impulse test:25272}  Dynamic Visual Acuity: {dynamic visual acuity:25273}   POSITIONAL TESTING: {Positional tests:25271}  MOTION SENSITIVITY:  Motion Sensitivity Quotient Intensity: 0 = none, 1 = Lightheaded, 2 = Mild, 3 = Moderate, 4 = Severe, 5 = Vomiting  Intensity  1. Sitting to supine   2. Supine to L side   3. Supine to R side   4. Supine to sitting   5. L Hallpike-Dix   6. Up from L    7. R Hallpike-Dix   8. Up from R    9. Sitting, head tipped to L knee   10. Head up from L knee   11. Sitting, head tipped to R knee   12. Head up from R knee   13. Sitting  head turns x5   14.Sitting head nods x5   15. In stance, 180 turn to L    16. In stance, 180 turn to R     OTHOSTATICS: {Exam; orthostatics:31331}  FUNCTIONAL GAIT: {Functional tests:24029}                                                                                                                             TREATMENT DATE: ***   Canalith Repositioning:  {Canalith Repositioning:25283} Gaze Adaptation:  {gaze adaptation:25286} Habituation:  {habituation:25288} Other: ***  PATIENT EDUCATION: Education details: *** Person educated: {Person educated:25204} Education method: {Education Method:25205} Education comprehension: {Education Comprehension:25206}  HOME EXERCISE PROGRAM:  GOALS: Goals reviewed with patient? {yes/no:20286}  SHORT TERM GOALS: Target date: ***  *** Baseline: Goal status: {GOALSTATUS:25110}  2.  *** Baseline:  Goal status: {GOALSTATUS:25110}  3.  *** Baseline:  Goal status: {GOALSTATUS:25110}  4.  *** Baseline:  Goal status: {GOALSTATUS:25110}  5.  *** Baseline:  Goal status: {GOALSTATUS:25110}  6.  ***  Baseline:  Goal status: {GOALSTATUS:25110}  LONG TERM GOALS: Target date: ***  *** Baseline:  Goal status: {GOALSTATUS:25110}  2.  *** Baseline:  Goal status: {GOALSTATUS:25110}  3.  *** Baseline:  Goal status: {GOALSTATUS:25110}  4.  *** Baseline:  Goal status: {GOALSTATUS:25110}  5.  *** Baseline:  Goal status: {GOALSTATUS:25110}  6.  *** Baseline:  Goal status: {GOALSTATUS:25110}  ASSESSMENT:  CLINICAL IMPRESSION: Patient is a *** y.o. *** who was seen today for physical therapy evaluation and treatment for ***.   OBJECTIVE IMPAIRMENTS: {opptimpairments:25111}.   ACTIVITY LIMITATIONS: {activitylimitations:27494}  PARTICIPATION LIMITATIONS: {participationrestrictions:25113}  PERSONAL FACTORS: {Personal factors:25162} are also affecting patient's functional outcome.   REHAB POTENTIAL:  {rehabpotential:25112}  CLINICAL DECISION MAKING: {clinical decision making:25114}  EVALUATION COMPLEXITY: {Evaluation complexity:25115}   PLAN:  PT FREQUENCY: {rehab frequency:25116}  PT DURATION: {rehab duration:25117}  PLANNED INTERVENTIONS: {rehab planned interventions:25118::97110-Therapeutic exercises,97530- Therapeutic [phone]- Neuromuscular re-education,97535- Self Rjmz,02859- Manual therapy,Patient/Family education}  PLAN FOR NEXT SESSION: ***   Selita Staiger, PT 08/11/2024, 8:03 AM

## 2024-08-11 NOTE — Therapy (Signed)
 OUTPATIENT PHYSICAL THERAPY VESTIBULAR EVALUATION   Patient Name: Rhonda Munoz MRN: [id number] DOB:25-Mar-1956, 68 y.o., female Today's Date: 08/11/2024  END OF SESSION:  PT End of Session - 08/11/24 1152     Visit Number 1    Number of Visits 12    Date for Recertification  10/10/24    Authorization Type MEDICARE    PT Start Time 1150    PT Stop Time 1230    PT Time Calculation (min) 40 min    Activity Tolerance Other (comment)   nausea   Behavior During Therapy [facility] for tasks assessed/performed          Past Medical History:  Diagnosis Date   Allergy    Cataract    Surgery   Depression    GERD (gastroesophageal reflux disease)    Hemorrhoids    Hyperlipidemia    Past Surgical History:  Procedure Laterality Date   ABDOMINAL HYSTERECTOMY     APPENDECTOMY  02/10/2024   CATARACT EXTRACTION, BILATERAL Bilateral 08/2022   second one 09/2022   CHOLECYSTECTOMY     EYE SURGERY  2024   Cataract   FINGER SURGERY Right    index finger x 1, middle finger x 2   GALLBLADDER SURGERY     JOINT REPLACEMENT     DIP JOINT FUSION   LAPAROSCOPIC APPENDECTOMY N/A 02/10/2024   Procedure: APPENDECTOMY, LAPAROSCOPIC;  Surgeon: Signe Mitzie LABOR, MD;  Location: [facility];  Service: General;  Laterality: N/A;   TONSILLECTOMY     TUBAL LIGATION     Patient Active Problem List   Diagnosis Date Noted   Acute maxillary sinusitis 06/13/2024   Perforated appendicitis 02/12/2024   Acute appendicitis 02/10/2024   Lymphadenopathy 08/19/2023   Vulvovaginal candidiasis 07/27/2023   Thrush 07/27/2023   Cough 07/27/2023   Benign paroxysmal positional vertigo 01/04/2023   Yeast dermatitis 07/02/2022   Depression 06/05/2021   Osteopenia 04/26/2020   Hypercalcemia 01/02/2020   Hyperlipemia 01/02/2020   Prediabetes 01/02/2020   Postmenopausal 12/13/2019   Knee pain, left 09/28/2017   Gastroesophageal reflux disease with esophagitis 05/18/2017   History of colon polyps 05/18/2017     PCP: Velma Ku, DO REFERRING PROVIDER: Velma Ku, DO REFERRING DIAG: R42 (ICD-10-CM) - Vertigo   THERAPY DIAG:  Dizziness and giddiness  BPPV (benign paroxysmal positional vertigo), left  BPPV (benign paroxysmal positional vertigo), right  ONSET DATE: 03/22/24  Rationale for Evaluation and Treatment: Rehabilitation  SUBJECTIVE:   SUBJECTIVE STATEMENT: The patient is known to our clinic from prior tx for vertigo and BPPV. She reports vertigo returned approximately 3 weeks ago while moving in bed. She notes laying in bed, rolling in bed, and looking up increase spinning vertigo.  Pt accompanied by: self  PERTINENT HISTORY: recent appendectomy, hyperlipidemia, h/o vertigo.   PAIN:  Are you having pain? No  PRECAUTIONS: None  WEIGHT BEARING RESTRICTIONS: No  FALLS: Has patient fallen in last 6 months? No  LIVING ENVIRONMENT: Lives with: lives alone Lives in: House/apartment  PLOF: Independent  PATIENT GOALS: reduce dizziness  OBJECTIVE:  Note: Objective measures were completed at Evaluation unless otherwise noted. COGNITION: Overall cognitive status: Within functional limits for tasks assessed   SENSATION: WFL  POSTURE:  No Significant postural limitations  Cervical ROM:  WFLs  GAIT: Gait pattern: WFL Distance walked: 75 feet  PATIENT SURVEYS:  DHI: THE DIZZINESS HANDICAP INVENTORY (DHI)  P1. Does looking up increase your problem? 4 = Yes  E2. Because of your problem, do you feel  frustrated? 4 = Yes  F3. Because of your problem, do you restrict your travel for business or recreation?  0 = No  P4. Does walking down the aisle of a supermarket increase your problems?  4 = Yes  F5. Because of your problem, do you have difficulty getting into or out of bed?  4 = Yes  F6. Does your problem significantly restrict your participation in social activities, such as going out to dinner, going to the movies, dancing, or going to parties? 4 = Yes  F7.  Because of your problem, do you have difficulty reading?  4 = Yes  P8. Does performing more ambitious activities such as sports, dancing, household chores (sweeping or putting dishes away) increase your problems?  2 = Sometimes  E9. Because of your problem, are you afraid to leave your home without having without having someone accompany you?  0 = No  E10. Because of your problem have you been embarrassed in front of others?  0 = No  P11. Do quick movements of your head increase your problem?  4 = Yes  F12. Because of your problem, do you avoid heights?  4 = Yes  P13. Does turning over in bed increase your problem?  4 = Yes  F14. Because of your problem, is it difficult for you to do strenuous homework or yard work? 2 = Sometimes  E15. Because of your problem, are you afraid people may think you are intoxicated? 0 = No  F16. Because of your problem, is it difficult for you to go for a walk by yourself?  0 = No  P17. Does walking down a sidewalk increase your problem?  0 = No  E18.Because of your problem, is it difficult for you to concentrate 0 = No  F19. Because of your problem, is it difficult for you to walk around your house in the dark? 2 = Sometimes  E20. Because of your problem, are you afraid to stay home alone?  0 = No  E21. Because of your problem, do you feel handicapped? 0 = No  E22. Has the problem placed stress on your relationships with members of your family or friends? 0 = No  E23. Because of your problem, are you depressed?  0 = No  F24. Does your problem interfere with your job or household responsibilities?  2 = Sometimes  P25. Does bending over increase your problem?  4 = Yes  TOTAL 48%    DHI Scoring Instructions  The patient is asked to answer each question as it pertains to dizziness or unsteadiness problems, specifically  considering their condition during the last month. Questions are designed to incorporate functional (F), physical  (P), and emotional (E) impacts  on disability.   Scores greater than 10 points should be referred to balance specialists for further evaluation.   16-34 Points (mild handicap)  36-52 Points (moderate handicap)  54+ Points (severe handicap)  Minimally Detectable Change: 17 points [place] [employer], [place])  VESTIBULAR ASSESSMENT:  GENERAL OBSERVATION: The patient walks into clinic independently.   SYMPTOM BEHAVIOR:  Subjective history: h/o recurring vertigo, h/o multi-canal BPPV  Non-Vestibular symptoms: h/o visual reliance-- sensitive to ceiling fans or busy environments  Type of dizziness: Spinning/Vertigo and Unsteady with head/body turns  Frequency: daily  Duration: seconds to minutes  Aggravating factors: rolling R and L, bending forward, looking up  Relieving factors: head stationary  Progression of symptoms: better  OCULOMOTOR EXAM:  Ocular Alignment: normal  Ocular ROM:  No Limitations  Spontaneous Nystagmus: absent  Gaze-Induced Nystagmus: absent  Smooth Pursuits: intact  Saccades: intact and no symptoms of imbalance with quick eye movements   VESTIBULAR - OCULAR REFLEX:   Slow VOR: Normal and Comment: x 10 reps, no dizziness today    POSITIONAL TESTING:  Right Dix-Hallpike: + for upbeating, rotary nystagmus x 30 seconds + nausea + sweating/heat  Left Dix-Hallpike: latency x 12 seconds, then 8 seconds of small amplitude nystagmus + nausea + sweating/heat Right Roll Test: provokes upbeating, R rotary nystagmus  Left Roll Test: None TESTING INDICATES R posterior canalithiasis (more severe) than L posterior canalithiasis   FUNCTIONAL GAIT: TBA as indicated   [facility] Adult PT Treatment:                                                DATE: 08/11/24 Canolith repositioning: R Epley's maneuver Did not retest due to patient nausea and needing to rest after maneuver  PATIENT EDUCATION: Education details: nature of symptoms, home treatment Person educated: Patient Education method:  Explanation Education comprehension: verbalized understanding  HOME EXERCISE PROGRAM:  GOALS: Goals reviewed with patient? Yes  SHORT TERM GOALS: Target date: 09/10/24  The patient will be indep with initial HEP Baseline: initiated at eval Goal status: INITIAL  2..   The patient will report no dizziness with bed mobility.  Baseline: Dizziness with rolling Goal status: INITIAL  LONG TERM GOALS: Target date: 10/11/24  The patient will be indep with progression of HEP. Baseline:  initiated at eval Goal status: INITIAL  2.  The patient will improve DHI by 15% to demonstrate improved functional abilities. Baseline: 48% Goal status: INITIAL  3.  The patient will have negative positional testing indicating resolution of BPPV. Baseline:  see above Goal status: INITIAL  4.  The patient will report self mgmt of BPPV due to chronic h/o vertigo. Baseline:   has h/o episodic vertigo Goal status: INITIAL  ASSESSMENT:  CLINICAL IMPRESSION: Patient is a 68 y.o. female who was seen today for physical therapy evaluation and treatment for vertigo. She presents today with positive R dix hallpike and positive L dix hallpike. PT treated R canalithiasis today due to more severe symptoms to R side. Plan to re-test next session and treat, and also instruct her son in home treatment with Epley's maneuver. PT to address deficits to return to baseline level of function.  OBJECTIVE IMPAIRMENTS: decreased activity tolerance, decreased balance, dizziness, and impaired vision/preception.   ACTIVITY LIMITATIONS: bending, bed mobility, and locomotion level  PARTICIPATION LIMITATIONS: cleaning, laundry, and community activity  PERSONAL FACTORS: 1-2 comorbidities: recent appendectomy, h/o vertigo are also affecting patient's functional outcome.   REHAB POTENTIAL: Good  CLINICAL DECISION MAKING: Evolving/moderate complexity  EVALUATION COMPLEXITY: Moderate  PLAN:  PT FREQUENCY: 1-2x/week  PT  DURATION: 8 weeks  PLANNED INTERVENTIONS: 97164- PT Re-evaluation, 97750- Physical Performance Testing, 97110-Therapeutic exercises, 97530- Therapeutic activity, [id number]- Neuromuscular re-education, 97535- Self Care, [id number]- Manual therapy, [id number]- Gait training, [id number]- Canalith repositioning, Patient/Family education, Balance training, and Visual/preceptual remediation/compensation  PLAN FOR NEXT SESSION:  Plan to recheck for positional vertigo. Discuss home management and habituation if needed.    Briona Korpela, PT 08/11/2024, 11:56 AM

## 2024-08-15 ENCOUNTER — Ambulatory Visit: Admitting: Rehabilitative and Restorative Service Providers"

## 2024-08-15 DIAGNOSIS — H8111 Benign paroxysmal vertigo, right ear: Secondary | ICD-10-CM

## 2024-08-15 DIAGNOSIS — R42 Dizziness and giddiness: Secondary | ICD-10-CM

## 2024-08-15 DIAGNOSIS — H8112 Benign paroxysmal vertigo, left ear: Secondary | ICD-10-CM

## 2024-08-15 NOTE — Therapy (Signed)
 " OUTPATIENT PHYSICAL THERAPY VESTIBULAR TREATMENT   Patient Name: Rhonda Munoz MRN: [id number] DOB:06-17-1956, 68 y.o., female Today's Date: 08/15/2024  END OF SESSION:  PT End of Session - 08/15/24 1151     Visit Number 2    Number of Visits 12    Date for Recertification  10/10/24    Authorization Type MEDICARE    PT Start Time 1151    PT Stop Time 1245    PT Time Calculation (min) 54 min    Activity Tolerance Other (comment)   nausea   Behavior During Therapy [facility] for tasks assessed/performed         Past Medical History:  Diagnosis Date   Allergy    Cataract    Surgery   Depression    GERD (gastroesophageal reflux disease)    Hemorrhoids    Hyperlipidemia    Past Surgical History:  Procedure Laterality Date   ABDOMINAL HYSTERECTOMY     APPENDECTOMY  02/10/2024   CATARACT EXTRACTION, BILATERAL Bilateral 08/2022   second one 09/2022   CHOLECYSTECTOMY     EYE SURGERY  2024   Cataract   FINGER SURGERY Right    index finger x 1, middle finger x 2   GALLBLADDER SURGERY     JOINT REPLACEMENT     DIP JOINT FUSION   LAPAROSCOPIC APPENDECTOMY N/A 02/10/2024   Procedure: APPENDECTOMY, LAPAROSCOPIC;  Surgeon: Signe Mitzie LABOR, MD;  Location: [facility];  Service: General;  Laterality: N/A;   TONSILLECTOMY     TUBAL LIGATION     Patient Active Problem List   Diagnosis Date Noted   Acute maxillary sinusitis 06/13/2024   Perforated appendicitis 02/12/2024   Acute appendicitis 02/10/2024   Lymphadenopathy 08/19/2023   Vulvovaginal candidiasis 07/27/2023   Thrush 07/27/2023   Cough 07/27/2023   Benign paroxysmal positional vertigo 01/04/2023   Yeast dermatitis 07/02/2022   Depression 06/05/2021   Osteopenia 04/26/2020   Hypercalcemia 01/02/2020   Hyperlipemia 01/02/2020   Prediabetes 01/02/2020   Postmenopausal 12/13/2019   Knee pain, left 09/28/2017   Gastroesophageal reflux disease with esophagitis 05/18/2017   History of colon polyps 05/18/2017     PCP: Velma Ku, DO REFERRING PROVIDER: Velma Ku, DO REFERRING DIAG: R42 (ICD-10-CM) - Vertigo   THERAPY DIAG:  No diagnosis found.  ONSET DATE: 03/22/24  Rationale for Evaluation and Treatment: Rehabilitation  SUBJECTIVE:   SUBJECTIVE STATEMENT: The patient is known to our clinic from prior tx for vertigo and BPPV. She reports vertigo returned approximately 3 weeks ago while moving in bed. She notes laying in bed, rolling in bed, and looking up increase spinning vertigo.  Pt accompanied by: self  PERTINENT HISTORY: recent appendectomy, hyperlipidemia, h/o vertigo.   PAIN:  Are you having pain? No  PRECAUTIONS: None  WEIGHT BEARING RESTRICTIONS: No  FALLS: Has patient fallen in last 6 months? No  LIVING ENVIRONMENT: Lives with: lives alone Lives in: House/apartment  PLOF: Independent  PATIENT GOALS: reduce dizziness  OBJECTIVE:  Note: Objective measures were completed at Evaluation unless otherwise noted. COGNITION: Overall cognitive status: Within functional limits for tasks assessed   SENSATION: WFL  POSTURE:  No Significant postural limitations  Cervical ROM:  WFLs  GAIT: Gait pattern: WFL Distance walked: 75 feet  PATIENT SURVEYS:  DHI: THE DIZZINESS HANDICAP INVENTORY (DHI)  P1. Does looking up increase your problem? 4 = Yes  E2. Because of your problem, do you feel frustrated? 4 = Yes  F3. Because of your problem, do you restrict your  travel for business or recreation?  0 = No  P4. Does walking down the aisle of a supermarket increase your problems?  4 = Yes  F5. Because of your problem, do you have difficulty getting into or out of bed?  4 = Yes  F6. Does your problem significantly restrict your participation in social activities, such as going out to dinner, going to the movies, dancing, or going to parties? 4 = Yes  F7. Because of your problem, do you have difficulty reading?  4 = Yes  P8. Does performing more ambitious activities  such as sports, dancing, household chores (sweeping or putting dishes away) increase your problems?  2 = Sometimes  E9. Because of your problem, are you afraid to leave your home without having without having someone accompany you?  0 = No  E10. Because of your problem have you been embarrassed in front of others?  0 = No  P11. Do quick movements of your head increase your problem?  4 = Yes  F12. Because of your problem, do you avoid heights?  4 = Yes  P13. Does turning over in bed increase your problem?  4 = Yes  F14. Because of your problem, is it difficult for you to do strenuous homework or yard work? 2 = Sometimes  E15. Because of your problem, are you afraid people may think you are intoxicated? 0 = No  F16. Because of your problem, is it difficult for you to go for a walk by yourself?  0 = No  P17. Does walking down a sidewalk increase your problem?  0 = No  E18.Because of your problem, is it difficult for you to concentrate 0 = No  F19. Because of your problem, is it difficult for you to walk around your house in the dark? 2 = Sometimes  E20. Because of your problem, are you afraid to stay home alone?  0 = No  E21. Because of your problem, do you feel handicapped? 0 = No  E22. Has the problem placed stress on your relationships with members of your family or friends? 0 = No  E23. Because of your problem, are you depressed?  0 = No  F24. Does your problem interfere with your job or household responsibilities?  2 = Sometimes  P25. Does bending over increase your problem?  4 = Yes  TOTAL 48%    DHI Scoring Instructions  The patient is asked to answer each question as it pertains to dizziness or unsteadiness problems, specifically  considering their condition during the last month. Questions are designed to incorporate functional (F), physical  (P), and emotional (E) impacts on disability.   Scores greater than 10 points should be referred to balance specialists for further evaluation.    16-34 Points (mild handicap)  36-52 Points (moderate handicap)  54+ Points (severe handicap)  Minimally Detectable Change: 17 points [place] [employer], [place])  VESTIBULAR ASSESSMENT:  GENERAL OBSERVATION: The patient walks into clinic independently.   SYMPTOM BEHAVIOR:  Subjective history: h/o recurring vertigo, h/o multi-canal BPPV  Non-Vestibular symptoms: h/o visual reliance-- sensitive to ceiling fans or busy environments  Type of dizziness: Spinning/Vertigo and Unsteady with head/body turns  Frequency: daily  Duration: seconds to minutes  Aggravating factors: rolling R and L, bending forward, looking up  Relieving factors: head stationary  Progression of symptoms: better  OCULOMOTOR EXAM:  Ocular Alignment: normal  Ocular ROM: No Limitations  Spontaneous Nystagmus: absent  Gaze-Induced Nystagmus: absent  Smooth Pursuits: intact  Saccades: intact and no symptoms of imbalance with quick eye movements   VESTIBULAR - OCULAR REFLEX:   Slow VOR: Normal and Comment: x 10 reps, no dizziness today    POSITIONAL TESTING:  Right Dix-Hallpike: + for upbeating, rotary nystagmus x 30 seconds + nausea + sweating/heat  Left Dix-Hallpike: latency x 12 seconds, then 8 seconds of small amplitude nystagmus + nausea + sweating/heat Right Roll Test: provokes upbeating, R rotary nystagmus  Left Roll Test: None TESTING INDICATES R posterior canalithiasis (more severe) than L posterior canalithiasis   FUNCTIONAL GAIT: TBA as indicated  [facility] Adult PT Treatment:                                                DATE: 08/15/24 Neuromuscular re-ed: POSITIONAL TESTING:  Right Dix-Hallpike: negative today Left Dix-Hallpike: latency x 8 seconds, then 15 seconds of upbeating, rotary L nystagmus Testing indicates initial L posterior canalithiasis After CRT re-tested and R horizontal roll=no nystagmus, L horizontal roll=geotropic nystagmus Self: Discussed home self treatment and self  epley's Patient's sister present today to learn home care and how to assist patient Canolith Repositioning: L epley's maneuver -- when rolling onto R shoulder, she got horizontal nystagmus indicating transition to horizontal canalithiasis Began L horizontal canolith repositioning and patient had to stop maneuver due to nausea and vomitting  [facility] Adult PT Treatment:                                                DATE: 08/11/24 Canolith repositioning: R Epley's maneuver Did not retest due to patient nausea and needing to rest after maneuver  PATIENT EDUCATION: Education details: nature of symptoms, home treatment Person educated: Patient Education method: Explanation Education comprehension: verbalized understanding  HOME EXERCISE PROGRAM: Access Code: [email or web address] URL: [email or web address] Date: 08/15/2024 Prepared by: Tawni Ferrier  Exercises - Self-Epley Maneuver Right Ear  - 2 x daily - 7 x weekly - 1 sets - 10 reps - Self-Epley Maneuver Left Ear  - 2 x daily - 7 x weekly - 1 sets - 10 reps  Patient Education - BPPV - Right Epley Maneuver - Left Epley Maneuver  GOALS: Goals reviewed with patient? Yes  SHORT TERM GOALS: Target date: 09/10/24  The patient will be indep with initial HEP Baseline: initiated at eval Goal status: INITIAL  2..   The patient will report no dizziness with bed mobility.  Baseline: Dizziness with rolling Goal status: INITIAL  LONG TERM GOALS: Target date: 10/11/24  The patient will be indep with progression of HEP. Baseline:  initiated at eval Goal status: INITIAL  2.  The patient will improve DHI by 15% to demonstrate improved functional abilities. Baseline: 48% Goal status: INITIAL  3.  The patient will have negative positional testing indicating resolution of BPPV. Baseline:  see above Goal status: INITIAL  4.  The patient will report self mgmt of BPPV due to chronic h/o vertigo. Baseline:   has h/o episodic  vertigo Goal status: INITIAL  ASSESSMENT:  CLINICAL IMPRESSION: The patient's symptoms worsened during session with repositioning of otoconia into horizontal canal leading to nausea/vomitting. PT to treat horizontal canal next session as patient able to tolerate.   EVAL: Patient is a 68 y.o. female  who was seen today for physical therapy evaluation and treatment for vertigo. She presents today with positive R dix hallpike and positive L dix hallpike. PT treated R canalithiasis today due to more severe symptoms to R side. Plan to re-test next session and treat, and also instruct her son in home treatment with Epley's maneuver. PT to address deficits to return to baseline level of function.  OBJECTIVE IMPAIRMENTS: decreased activity tolerance, decreased balance, dizziness, and impaired vision/preception.    PLAN:  PT FREQUENCY: 1-2x/week  PT DURATION: 8 weeks  PLANNED INTERVENTIONS: 97164- PT Re-evaluation, 97750- Physical Performance Testing, 97110-Therapeutic exercises, 97530- Therapeutic activity, [id number]- Neuromuscular re-education, 97535- Self Care, [id number]- Manual therapy, [phone]- Gait training, [phone]- Canalith repositioning, Patient/Family education, Balance training, and Visual/preceptual remediation/compensation  PLAN FOR NEXT SESSION:  Plan to recheck for positional vertigo. Discuss home management and habituation if needed.    Marietta Sikkema, PT 08/15/2024, 1:24 PM  "

## 2024-08-16 ENCOUNTER — Ambulatory Visit: Admitting: Rehabilitative and Restorative Service Providers"

## 2024-08-24 ENCOUNTER — Telehealth: Payer: Self-pay | Admitting: Rehabilitative and Restorative Service Providers"

## 2024-08-24 ENCOUNTER — Ambulatory Visit: Payer: Self-pay

## 2024-08-24 ENCOUNTER — Ambulatory Visit: Admitting: Rehabilitative and Restorative Service Providers"

## 2024-08-24 DIAGNOSIS — H8112 Benign paroxysmal vertigo, left ear: Secondary | ICD-10-CM

## 2024-08-24 DIAGNOSIS — R42 Dizziness and giddiness: Secondary | ICD-10-CM

## 2024-08-24 DIAGNOSIS — H8111 Benign paroxysmal vertigo, right ear: Secondary | ICD-10-CM

## 2024-08-24 NOTE — Telephone Encounter (Signed)
 FYI Only or Action Required?: Pt inquiring if she can take Zofran  earlier than every 8 hours - pt advised to talk to PCP at appt. Pt scheduled with PCP 08/26/24.  Patient was last seen in primary care on 06/13/2024 by Alvia Bring, DO.  Called Nurse Triage reporting Dizziness.  Symptoms began several weeks ago.  Interventions attempted: Prescription medications: Zofran  meclizine .  Symptoms are: gradually worsening.  Triage Disposition: No disposition on file.  Patient/caregiver understands and will follow disposition?:   Reason for Disposition  [1] MODERATE dizziness (e.g., vertigo; feels very unsteady, interferes with normal activities) AND [2] has been evaluated by doctor (or NP/PA) for this  Answer Assessment - Initial Assessment Questions Pt calling to report severe vertigo. Pt states she just came from pt - pt had vertigo and threw up in therapy room - PCP office recommended pt schedule appt.  Severe vertigo with head movements or changing position leading to nausea, unable to perform physical therapy movements.  Pt took Zofran  before PT appt but states she came close to vomiting during appt. Pt states meclazine makes her sleep and she has things to do so she does not like to take this medication during the day only right before bed.    Pt inquiring if she can take Zofran  earlier than every 8 hours - pt advised to talk to PCP at appt. Pt scheduled with PCP 08/26/24.  1. DESCRIPTION: Describe your dizziness.     Like the room is spinning 2. VERTIGO: Do you feel like either you or the room is spinning or tilting?      Yes  3. LIGHTHEADED: Do you feel lightheaded? (e.g., somewhat faint, woozy, weak upon standing)     Denies  4. SEVERITY: How bad is it?  Can you walk?     So bad to the point of wanting to vomit 5. ONSET:  When did the dizziness begin?     For a while but this time around onset 6 weeks 6. AGGRAVATING FACTORS: Does anything make it worse? (e.g.,  standing, change in head position)     Rolling over, picking things up - any head movement and changing positions 7. CAUSE: What do you think is causing the dizziness?     Vertigo  8. RECURRENT SYMPTOM: Have you had dizziness before? If Yes, ask: When was the last time? What happened that time?     Yes  9. OTHER SYMPTOMS: Do you have any other symptoms? (e.g., earache, headache, numbness, tinnitus, vomiting, weakness)     Denies  Protocols used: Dizziness - Vertigo-A-AH  Copied from CRM #[id number]. Topic: Clinical - Red Word Triage >> Aug 24, 2024  1:54 PM Shanda MATSU wrote: Red Word that prompted transfer to Nurse Triage: Patient reporting vertigo is getting worse causing extreme vomiting, patient stated it was so bad that when she went to physical therapy today her physical therapist reached out to PCP who adv patient to schedule appt.

## 2024-08-24 NOTE — Telephone Encounter (Signed)
 Spoke with patient to recommend f/u with MD to discuss multi-canal BPPV. Shamela Haydon, PT

## 2024-08-24 NOTE — Therapy (Signed)
 " OUTPATIENT PHYSICAL THERAPY VESTIBULAR TREATMENT   Patient Name: Rhonda Munoz MRN: [id number] DOB:[birth year], 68 y.o., female Today's Date: 08/24/2024  END OF SESSION:  PT End of Session - 08/24/24 1150     Visit Number 3    Number of Visits 12    Date for Recertification  10/10/24    Authorization Type MEDICARE    PT Start Time 1152    PT Stop Time 1240    PT Time Calculation (min) 48 min    Activity Tolerance Other (comment)   nausea   Behavior During Therapy WFL for tasks assessed/performed          Past Medical History:  Diagnosis Date   Allergy    Cataract    Surgery   Depression    GERD (gastroesophageal reflux disease)    Hemorrhoids    Hyperlipidemia    Past Surgical History:  Procedure Laterality Date   ABDOMINAL HYSTERECTOMY     APPENDECTOMY  02/10/2024   CATARACT EXTRACTION, BILATERAL Bilateral 08/2022   second one 09/2022   CHOLECYSTECTOMY     EYE SURGERY  2024   Cataract   FINGER SURGERY Right    index finger x 1, middle finger x 2   GALLBLADDER SURGERY     JOINT REPLACEMENT     DIP JOINT FUSION   LAPAROSCOPIC APPENDECTOMY N/A 02/10/2024   Procedure: APPENDECTOMY, LAPAROSCOPIC;  Surgeon: Signe Mitzie LABOR, MD;  Location: [facility];  Service: General;  Laterality: N/A;   TONSILLECTOMY     TUBAL LIGATION     Patient Active Problem List   Diagnosis Date Noted   Acute maxillary sinusitis 06/13/2024   Perforated appendicitis 02/12/2024   Acute appendicitis 02/10/2024   Lymphadenopathy 08/19/2023   Vulvovaginal candidiasis 07/27/2023   Thrush 07/27/2023   Cough 07/27/2023   Benign paroxysmal positional vertigo 01/04/2023   Yeast dermatitis 07/02/2022   Depression 06/05/2021   Osteopenia 04/26/2020   Hypercalcemia 01/02/2020   Hyperlipemia 01/02/2020   Prediabetes 01/02/2020   Postmenopausal 12/13/2019   Knee pain, left 09/28/2017   Gastroesophageal reflux disease with esophagitis 05/18/2017   History of colon polyps 05/18/2017     PCP: Velma Ku, DO REFERRING PROVIDER: Velma Ku, DO REFERRING DIAG: R42 (ICD-10-CM) - Vertigo   THERAPY DIAG:  Dizziness and giddiness  BPPV (benign paroxysmal positional vertigo), left  BPPV (benign paroxysmal positional vertigo), right  ONSET DATE: 03/22/24  Rationale for Evaluation and Treatment: Rehabilitation  SUBJECTIVE:   SUBJECTIVE STATEMENT: The patient reports dizziness is worse than it has ever been-- since last session (where otoconia repositioned into horizontal canal). She noticed leaning forward and looking up lead to severe vertigo.  She took zofran  today before our session. Pt accompanied by: self  PERTINENT HISTORY: recent appendectomy, hyperlipidemia, h/o vertigo.   PAIN:  Are you having pain? No  PRECAUTIONS: None  WEIGHT BEARING RESTRICTIONS: No  FALLS: Has patient fallen in last 6 months? No  LIVING ENVIRONMENT: Lives with: lives alone Lives in: House/apartment  PLOF: Independent  PATIENT GOALS: reduce dizziness  OBJECTIVE:  Note: Objective measures were completed at Evaluation unless otherwise noted. COGNITION: Overall cognitive status: Within functional limits for tasks assessed   SENSATION: WFL  POSTURE:  No Significant postural limitations  Cervical ROM:  WFLs  GAIT: Gait pattern: WFL Distance walked: 75 feet  PATIENT SURVEYS:  DHI: THE DIZZINESS HANDICAP INVENTORY (DHI)  P1. Does looking up increase your problem? 4 = Yes  E2. Because of your problem, do you feel frustrated?  4 = Yes  F3. Because of your problem, do you restrict your travel for business or recreation?  0 = No  P4. Does walking down the aisle of a supermarket increase your problems?  4 = Yes  F5. Because of your problem, do you have difficulty getting into or out of bed?  4 = Yes  F6. Does your problem significantly restrict your participation in social activities, such as going out to dinner, going to the movies, dancing, or going to parties? 4 =  Yes  F7. Because of your problem, do you have difficulty reading?  4 = Yes  P8. Does performing more ambitious activities such as sports, dancing, household chores (sweeping or putting dishes away) increase your problems?  2 = Sometimes  E9. Because of your problem, are you afraid to leave your home without having without having someone accompany you?  0 = No  E10. Because of your problem have you been embarrassed in front of others?  0 = No  P11. Do quick movements of your head increase your problem?  4 = Yes  F12. Because of your problem, do you avoid heights?  4 = Yes  P13. Does turning over in bed increase your problem?  4 = Yes  F14. Because of your problem, is it difficult for you to do strenuous homework or yard work? 2 = Sometimes  E15. Because of your problem, are you afraid people may think you are intoxicated? 0 = No  F16. Because of your problem, is it difficult for you to go for a walk by yourself?  0 = No  P17. Does walking down a sidewalk increase your problem?  0 = No  E18.Because of your problem, is it difficult for you to concentrate 0 = No  F19. Because of your problem, is it difficult for you to walk around your house in the dark? 2 = Sometimes  E20. Because of your problem, are you afraid to stay home alone?  0 = No  E21. Because of your problem, do you feel handicapped? 0 = No  E22. Has the problem placed stress on your relationships with members of your family or friends? 0 = No  E23. Because of your problem, are you depressed?  0 = No  F24. Does your problem interfere with your job or household responsibilities?  2 = Sometimes  P25. Does bending over increase your problem?  4 = Yes  TOTAL 48%    DHI Scoring Instructions  The patient is asked to answer each question as it pertains to dizziness or unsteadiness problems, specifically  considering their condition during the last month. Questions are designed to incorporate functional (F), physical  (P), and emotional (E)  impacts on disability.   Scores greater than 10 points should be referred to balance specialists for further evaluation.   16-34 Points (mild handicap)  36-52 Points (moderate handicap)  54+ Points (severe handicap)  Minimally Detectable Change: 17 points [place] [employer], [place])  VESTIBULAR ASSESSMENT:  GENERAL OBSERVATION: The patient walks into clinic independently.   SYMPTOM BEHAVIOR:  Subjective history: h/o recurring vertigo, h/o multi-canal BPPV  Non-Vestibular symptoms: h/o visual reliance-- sensitive to ceiling fans or busy environments  Type of dizziness: Spinning/Vertigo and Unsteady with head/body turns  Frequency: daily  Duration: seconds to minutes  Aggravating factors: rolling R and L, bending forward, looking up  Relieving factors: head stationary  Progression of symptoms: better  OCULOMOTOR EXAM:  Ocular Alignment: normal  Ocular ROM: No  Limitations  Spontaneous Nystagmus: absent  Gaze-Induced Nystagmus: absent  Smooth Pursuits: intact  Saccades: intact and no symptoms of imbalance with quick eye movements   VESTIBULAR - OCULAR REFLEX:   Slow VOR: Normal and Comment: x 10 reps, no dizziness today    POSITIONAL TESTING:  Right Dix-Hallpike: + for upbeating, rotary nystagmus x 30 seconds + nausea + sweating/heat  Left Dix-Hallpike: latency x 12 seconds, then 8 seconds of small amplitude nystagmus + nausea + sweating/heat Right Roll Test: provokes upbeating, R rotary nystagmus  Left Roll Test: None TESTING INDICATES R posterior canalithiasis (more severe) than L posterior canalithiasis   FUNCTIONAL GAIT: TBA as indicated   [facility] Adult PT Treatment:                                                DATE: 08/24/24 Neuromuscular re-ed: POSITIONAL TESTING:  Right Sidelying: + for upbeating, rotary nystagmus x 15 seconds Left Sidelying:  Negative for nystagmus and dizziness Right horiztonal roll:  Negative for nystagmus and dizziness Left horizontal roll:  Negative for nystagmus and dizziness Testing indicates R posterior canaithiasis today We starting moving into Semont maneuver for CRT for R posterior canal and patient had severe vertigo with nausea with horizontal nystagmus (in R sidelying, it is R geotropic) After CRT re-tested and found initial upbeating, rotary nsytagmus for R posterior BPPV. When those nystagmus settled, she got geotropic nystagmus that are severe. We had to end session due to nausea-- ice pack helps reduce heat/nausea Canolith Repositioning: Horizontal barbeque roll R side At end of barbeque roll, when transitioning back to the R side, she gets upbeating, right rotary nystagmus that lasts x seconds   [facility] Adult PT Treatment:                                                DATE: 08/15/24 Neuromuscular re-ed: POSITIONAL TESTING:  Right Dix-Hallpike: negative today Left Dix-Hallpike: latency x 8 seconds, then 15 seconds of upbeating, rotary L nystagmus Testing indicates initial L posterior canalithiasis After CRT re-tested and R horizontal roll=no nystagmus, L horizontal roll=geotropic nystagmus Self: Discussed home self treatment and self epley's Patient's sister present today to learn home care and how to assist patient Canolith Repositioning: L epley's maneuver -- when rolling onto R shoulder, she got horizontal nystagmus indicating transition to horizontal canalithiasis Began L horizontal canolith repositioning and patient had to stop maneuver due to nausea and vomitting  [facility] Adult PT Treatment:                                                DATE: 08/11/24 Canolith repositioning: R Epley's maneuver Did not retest due to patient nausea and needing to rest after maneuver  PATIENT EDUCATION: Education details: nature of symptoms, home treatment Person educated: Patient Education method: Explanation Education comprehension: verbalized understanding  HOME EXERCISE PROGRAM: None-- recommended return to prior  program for rolling R<>L.  GOALS: Goals reviewed with patient? Yes  SHORT TERM GOALS: Target date: 09/10/24  The patient will be indep with initial HEP Baseline: initiated at eval Goal status: INITIAL  2..   The patient will report no dizziness with bed mobility.  Baseline: Dizziness with rolling Goal status: INITIAL  LONG TERM GOALS: Target date: 10/11/24  The patient will be indep with progression of HEP. Baseline:  initiated at eval Goal status: INITIAL  2.  The patient will improve DHI by 15% to demonstrate improved functional abilities. Baseline: 48% Goal status: INITIAL  3.  The patient will have negative positional testing indicating resolution of BPPV. Baseline:  see above Goal status: INITIAL  4.  The patient will report self mgmt of BPPV due to chronic h/o vertigo. Baseline:   has h/o episodic vertigo Goal status: INITIAL  ASSESSMENT:  CLINICAL IMPRESSION: The patient arrived today with different side involved -- still with multi-canal BPPV, but on the R side today as compared to L side last visit. She initially had positive R sidelying test and negative L sidelying, and negative bilat horizontal roll tests. However, when moving into R semont maneuver (going with semont today due to Epley's on L last week repositioning otoconia into horizontal canal), she developed severe geotropic nystagmus. Therefore, PT treated with R barbeque roll with dec'd symptoms. Then moved sit>R sidelying and geotropic nystagmus were present again and severe. Patient could not tolerate further treatment today. We discussed her attempting to do rolling at home for habituation if she takes nausea meds (per MD prescription to take as needed) with the goal to desensitize her system to vertigo in order for her to tolerate treatment. PT inquired about h/o migraines with no reports of HA, visual changes, or visual aura. PT contacted referring MD-- may benefit from further workup due to severity of  symptoms and multi-canal symptoms not responding to repositioning maneuvers.   EVAL: Patient is a 68 y.o. female who was seen today for physical therapy evaluation and treatment for vertigo. She presents today with positive R dix hallpike and positive L dix hallpike. PT treated R canalithiasis today due to more severe symptoms to R side. Plan to re-test next session and treat, and also instruct her son in home treatment with Epley's maneuver. PT to address deficits to return to baseline level of function.  OBJECTIVE IMPAIRMENTS: decreased activity tolerance, decreased balance, dizziness, and impaired vision/preception.   PLAN:  PT FREQUENCY: 1-2x/week  PT DURATION: 8 weeks  PLANNED INTERVENTIONS: 97164- PT Re-evaluation, 97750- Physical Performance Testing, 97110-Therapeutic exercises, 97530- Therapeutic activity, [id number]- Neuromuscular re-education, 97535- Self Care, [id number]- Manual therapy, [phone]- Gait training, [phone]- Canalith repositioning, Patient/Family education, Balance training, and Visual/preceptual remediation/compensation  PLAN FOR NEXT SESSION:  Plan to recheck for positional vertigo. Discuss home management and habituation if needed.    Rhonda Munoz, PT 08/24/2024, 1:56 PM  "

## 2024-08-24 NOTE — Telephone Encounter (Signed)
 Patient informed - states she does have meclizine  - she states that both the meclizine  and zofran  causes excessive sleepiness.  Takes meclizine  at night and this works for her. Patient has upcoming appt on Friday 08/26/24.

## 2024-08-26 ENCOUNTER — Encounter: Payer: Self-pay | Admitting: Family Medicine

## 2024-08-26 ENCOUNTER — Ambulatory Visit: Admitting: Family Medicine

## 2024-08-26 VITALS — BP 130/76 | HR 73 | Ht 67.0 in | Wt 198.0 lb

## 2024-08-26 DIAGNOSIS — H8113 Benign paroxysmal vertigo, bilateral: Secondary | ICD-10-CM | POA: Diagnosis not present

## 2024-08-26 DIAGNOSIS — R42 Dizziness and giddiness: Secondary | ICD-10-CM

## 2024-08-26 MED ORDER — DIAZEPAM 5 MG PO TABS: ORAL_TABLET | ORAL | 0 refills | Status: DC

## 2024-08-26 NOTE — Progress Notes (Signed)
 " Rhonda Munoz - 69 y.o. female MRN [id number]  Date of birth: [birth year]  Subjective Chief Complaint  Patient presents with   Constipation   Dizziness    HPI Rhonda Munoz is a 69 y.o. female here today for follow up.   History of BPPV that has typically responded well to vestibular therapy as well as meclizine .  Recently her episodes have worsened with more nystagmus and not responsive to vestibular therapy.  Per most recent PT may benefit from further workup due to severity of symptoms and multi-canal symptoms not responding to repositioning maneuvers.  Meclizine  makes her drowsy so she really is only using at night at this time.  She has significant nausea with her vertigo.  She denies headache, sinus pain/pressure, visual acuity changes, tinnitus.    ROS:  A comprehensive ROS was completed and negative except as noted per HPI  Allergies[1]  Past Medical History:  Diagnosis Date   Allergy    Cataract    Surgery   Depression    GERD (gastroesophageal reflux disease)    Hemorrhoids    Hyperlipidemia     Past Surgical History:  Procedure Laterality Date   ABDOMINAL HYSTERECTOMY     APPENDECTOMY  02/10/2024   CATARACT EXTRACTION, BILATERAL Bilateral 08/2022   second one 09/2022   CHOLECYSTECTOMY     EYE SURGERY  2024   Cataract   FINGER SURGERY Right    index finger x 1, middle finger x 2   GALLBLADDER SURGERY     JOINT REPLACEMENT     DIP JOINT FUSION   LAPAROSCOPIC APPENDECTOMY N/A 02/10/2024   Procedure: APPENDECTOMY, LAPAROSCOPIC;  Surgeon: Signe Mitzie LABOR, MD;  Location: [facility];  Service: General;  Laterality: N/A;   TONSILLECTOMY     TUBAL LIGATION      Social History   Socioeconomic History   Marital status: Widowed    Spouse name: Not on file   Number of children: 2   Years of education: 14   Highest education level: Associate degree: academic program  Occupational History   Occupation: Retired  Tobacco Use   Smoking status:  Never   Smokeless tobacco: Never  Vaping Use   Vaping status: Never Used  Substance and Sexual Activity   Alcohol use: Yes    Comment: rarely   Drug use: Never   Sexual activity: Not Currently    Partners: Male    Birth control/protection: Post-menopausal, Surgical  Other Topics Concern   Not on file  Social History Narrative   Lives with her dog. She has two sons. She enjoys reading, social gatherings at church and spending time with her grandchildren. Her sons live in [place].   Social Drivers of Health   Tobacco Use: Low Risk (08/26/2024)   Patient History    Smoking Tobacco Use: Never    Smokeless Tobacco Use: Never    Passive Exposure: Not on file  Financial Resource Strain: Low Risk (06/07/2024)   Overall Financial Resource Strain (CARDIA)    Difficulty of Paying Living Expenses: Not hard at all  Food Insecurity: No Food Insecurity (06/07/2024)   Epic    Worried About Programme Researcher, Broadcasting/film/video in the Last Year: Never true    Ran Out of Food in the Last Year: Never true  Transportation Needs: No Transportation Needs (06/07/2024)   Epic    Lack of Transportation (Medical): No    Lack of Transportation (Non-Medical): No  Physical Activity: Insufficiently Active (06/07/2024)  Exercise Vital Sign    Days of Exercise per Week: 3 days    Minutes of Exercise per Session: 20 min  Stress: No Stress Concern Present (06/07/2024)   [employer] of Occupational Health - Occupational Stress Questionnaire    Feeling of Stress: Only a little  Social Connections: Moderately Integrated (06/07/2024)   Social Connection and Isolation Panel    Frequency of Communication with Friends and Family: More than three times a week    Frequency of Social Gatherings with Friends and Family: Twice a week    Attends Religious Services: More than 4 times per year    Active Member of [employer] or Organizations: Yes    Attends Banker Meetings: More than 4 times per year     Marital Status: Widowed  Depression (PHQ2-9): Low Risk (08/26/2024)   Depression (PHQ2-9)    PHQ-2 Score: 0  Alcohol Screen: Low Risk (06/07/2024)   Alcohol Screen    Last Alcohol Screening Score (AUDIT): 0  Housing: Low Risk (06/07/2024)   Epic    Unable to Pay for Housing in the Last Year: No    Number of Times Moved in the Last Year: 0    Homeless in the Last Year: No  Utilities: Not At Risk (06/07/2024)   Epic    Threatened with loss of utilities: No  Health Literacy: Adequate Health Literacy (06/07/2024)   B1300 Health Literacy    Frequency of need for help with medical instructions: Never    Family History  Problem Relation Age of Onset   Breast cancer Mother    Hypertension Mother    Stroke Mother    Colon polyps Mother    Arthritis Mother    Asthma Mother    Cancer Mother    Hearing loss Mother    Miscarriages / Stillbirths Mother    Obesity Mother    Diabetes Father    Heart attack Father    Heart disease Father    Hypertension Father    Hyperlipidemia Father    Arthritis Father    Obesity Father    Varicose Veins Father    Cancer Sister    Colon cancer Sister    Heart failure Maternal Grandmother    Colon cancer Maternal Grandfather    Heart failure Paternal Grandmother    Cancer Sister    Obesity Sister    Esophageal cancer Neg Hx    Rectal cancer Neg Hx    Stomach cancer Neg Hx     Health Maintenance  Topic Date Due   Hepatitis C Screening  Never done   COVID-19 Vaccine (4 - 2025-26 season) 04/24/2025 (Originally 04/25/2024)   DTaP/Tdap/Td (3 - Td or Tdap) 01/12/2025   Medicare Annual Wellness (AWV)  06/07/2025   Bone Density Scan  04/21/2026   Mammogram  08/04/2026   Colonoscopy  05/21/2028   Pneumococcal Vaccine: 50+ Years  Completed   Influenza Vaccine  Completed   Zoster Vaccines- Shingrix  Completed   Meningococcal B Vaccine  Aged Out      ----------------------------------------------------------------------------------------------------------------------------------------------------------------------------------------------------------------- Physical Exam BP 130/76 (BP Location: Left Arm, Patient Position: Sitting, Cuff Size: Normal)   Pulse 73   Ht 5' 7 (1.702 m)   Wt 198 lb (89.8 kg)   SpO2 98%   BMI 31.01 kg/m   Physical Exam Constitutional:      Appearance: Normal appearance.  Neurological:     Mental Status: She is alert.     Comments: Dizziness and nystagmus noted with minimal  rotational movement.      ------------------------------------------------------------------------------------------------------------------------------------------------------------------------------------------------------------------- Assessment and Plan  Benign paroxysmal positional vertigo Continues to have symptoms refractory to conservative treatment with vestibular therapy.  Continues to have nystagmus with movement.  Referral placed to ENT and will order MRI brain to rule out any lesions that may be causing her symptoms to worsen.  She declines trial of steroids or diazepam to help with vertigo symptoms.    Meds ordered this encounter  Medications   diazepam (VALIUM) 5 MG tablet    Sig: Take 2 hours prior to procedure.  May repeat does 30 minute prior if needed.    Dispense:  2 tablet    Refill:  0    No follow-ups on file.        [1]  Allergies Allergen Reactions   Clenpiq  [Sod Picosulfate-Mag Ox-Cit Acd] Other (See Comments)    Sweating, vomiting and bad headache   Effexor [Venlafaxine] Other (See Comments)    VISUAL HALLUCINATIONS   Codeine Nausea Only and Rash   Hydromorphone Rash    Tachycardia   Procaine Nausea Only and Rash   "

## 2024-08-26 NOTE — Assessment & Plan Note (Signed)
 Continues to have symptoms refractory to conservative treatment with vestibular therapy.  Continues to have nystagmus with movement.  Referral placed to ENT and will order MRI brain to rule out any lesions that may be causing her symptoms to worsen.  She declines trial of steroids or diazepam to help with vertigo symptoms.

## 2024-08-31 ENCOUNTER — Telehealth: Payer: Self-pay | Admitting: Rehabilitative and Restorative Service Providers"

## 2024-08-31 NOTE — Telephone Encounter (Signed)
 Dalaney called the clinic today. She was seen by ENT yesterday and has further medical testing from an audiologist in March. She notes she continues with positional vertigo symptoms. She inquired about further visits with PT while awaiting audiology assessment.  PT to reach out to Dr. Alvia in order to determine if okay to continue current plan for therapy. Kaela Beitz, PT

## 2024-09-01 ENCOUNTER — Encounter: Payer: Self-pay | Admitting: Rehabilitative and Restorative Service Providers"

## 2024-09-02 ENCOUNTER — Ambulatory Visit: Admitting: Rehabilitative and Restorative Service Providers"

## 2024-09-05 ENCOUNTER — Ambulatory Visit

## 2024-09-05 DIAGNOSIS — R42 Dizziness and giddiness: Secondary | ICD-10-CM | POA: Diagnosis not present

## 2024-09-05 MED ORDER — GADOBUTROL 1 MMOL/ML IV SOLN
9.0000 mL | Freq: Once | INTRAVENOUS | Status: AC | PRN
  Administered 2024-09-05: 9 mL via INTRAVENOUS

## 2024-09-08 ENCOUNTER — Ambulatory Visit: Attending: Family Medicine | Admitting: Rehabilitative and Restorative Service Providers"

## 2024-09-08 DIAGNOSIS — R42 Dizziness and giddiness: Secondary | ICD-10-CM | POA: Diagnosis present

## 2024-09-08 DIAGNOSIS — H8112 Benign paroxysmal vertigo, left ear: Secondary | ICD-10-CM | POA: Insufficient documentation

## 2024-09-08 DIAGNOSIS — H8111 Benign paroxysmal vertigo, right ear: Secondary | ICD-10-CM | POA: Diagnosis present

## 2024-09-08 NOTE — Therapy (Signed)
 " OUTPATIENT PHYSICAL THERAPY VESTIBULAR TREATMENT   Patient Name: Rhonda Munoz MRN: [id number] DOB:[birth year], 69 y.o., female Today's Date: 09/08/2024  END OF SESSION:  PT End of Session - 09/08/24 1410     Visit Number 4    Number of Visits 12    Date for Recertification  10/10/24    Authorization Type MEDICARE    PT Start Time 1410    PT Stop Time 1450    PT Time Calculation (min) 40 min    Activity Tolerance Other (comment)   nausea   Behavior During Therapy [facility] for tasks assessed/performed          Past Medical History:  Diagnosis Date   Allergy    Cataract    Surgery   Depression    GERD (gastroesophageal reflux disease)    Hemorrhoids    Hyperlipidemia    Past Surgical History:  Procedure Laterality Date   ABDOMINAL HYSTERECTOMY     APPENDECTOMY  02/10/2024   CATARACT EXTRACTION, BILATERAL Bilateral 08/2022   second one 09/2022   CHOLECYSTECTOMY     EYE SURGERY  2024   Cataract   FINGER SURGERY Right    index finger x 1, middle finger x 2   GALLBLADDER SURGERY     JOINT REPLACEMENT     DIP JOINT FUSION   LAPAROSCOPIC APPENDECTOMY N/A 02/10/2024   Procedure: APPENDECTOMY, LAPAROSCOPIC;  Surgeon: Signe Mitzie LABOR, MD;  Location: [facility];  Service: General;  Laterality: N/A;   TONSILLECTOMY     TUBAL LIGATION     Patient Active Problem List   Diagnosis Date Noted   Acute maxillary sinusitis 06/13/2024   Perforated appendicitis 02/12/2024   Acute appendicitis 02/10/2024   Lymphadenopathy 08/19/2023   Vulvovaginal candidiasis 07/27/2023   Thrush 07/27/2023   Cough 07/27/2023   Benign paroxysmal positional vertigo 01/04/2023   Yeast dermatitis 07/02/2022   Depression 06/05/2021   Osteopenia 04/26/2020   Hypercalcemia 01/02/2020   Hyperlipemia 01/02/2020   Prediabetes 01/02/2020   Postmenopausal 12/13/2019   Knee pain, left 09/28/2017   Gastroesophageal reflux disease with esophagitis 05/18/2017   History of colon polyps 05/18/2017     PCP: Velma Ku, DO REFERRING PROVIDER: Velma Ku, DO REFERRING DIAG: R42 (ICD-10-CM) - Vertigo   THERAPY DIAG:  No diagnosis found.  ONSET DATE: 03/22/24  Rationale for Evaluation and Treatment: Rehabilitation  SUBJECTIVE:   SUBJECTIVE STATEMENT: The pateint had to help her sister last week and rescheduled. She is continuing to have dizziness-- she plans to see specialist at [facility] in [place] in March.  She notes occasional mild episodes of dizziness, however, she had one severe episode of vertigo last night. Pt accompanied by: self  PERTINENT HISTORY: recent appendectomy, hyperlipidemia, h/o vertigo.   PAIN:  Are you having pain? No  PRECAUTIONS: None  WEIGHT BEARING RESTRICTIONS: No  FALLS: Has patient fallen in last 6 months? No  LIVING ENVIRONMENT: Lives with: lives alone Lives in: House/apartment  PLOF: Independent  PATIENT GOALS: reduce dizziness  OBJECTIVE:  Note: Objective measures were completed at Evaluation unless otherwise noted. COGNITION: Overall cognitive status: Within functional limits for tasks assessed   SENSATION: WFL  POSTURE:  No Significant postural limitations  Cervical ROM:  WFLs  GAIT: Gait pattern: WFL Distance walked: 75 feet  PATIENT SURVEYS:  DHI: THE DIZZINESS HANDICAP INVENTORY (DHI)  P1. Does looking up increase your problem? 4 = Yes  E2. Because of your problem, do you feel frustrated? 4 = Yes  F3.  Because of your problem, do you restrict your travel for business or recreation?  0 = No  P4. Does walking down the aisle of a supermarket increase your problems?  4 = Yes  F5. Because of your problem, do you have difficulty getting into or out of bed?  4 = Yes  F6. Does your problem significantly restrict your participation in social activities, such as going out to dinner, going to the movies, dancing, or going to parties? 4 = Yes  F7. Because of your problem, do you have difficulty reading?  4 = Yes   P8. Does performing more ambitious activities such as sports, dancing, household chores (sweeping or putting dishes away) increase your problems?  2 = Sometimes  E9. Because of your problem, are you afraid to leave your home without having without having someone accompany you?  0 = No  E10. Because of your problem have you been embarrassed in front of others?  0 = No  P11. Do quick movements of your head increase your problem?  4 = Yes  F12. Because of your problem, do you avoid heights?  4 = Yes  P13. Does turning over in bed increase your problem?  4 = Yes  F14. Because of your problem, is it difficult for you to do strenuous homework or yard work? 2 = Sometimes  E15. Because of your problem, are you afraid people may think you are intoxicated? 0 = No  F16. Because of your problem, is it difficult for you to go for a walk by yourself?  0 = No  P17. Does walking down a sidewalk increase your problem?  0 = No  E18.Because of your problem, is it difficult for you to concentrate 0 = No  F19. Because of your problem, is it difficult for you to walk around your house in the dark? 2 = Sometimes  E20. Because of your problem, are you afraid to stay home alone?  0 = No  E21. Because of your problem, do you feel handicapped? 0 = No  E22. Has the problem placed stress on your relationships with members of your family or friends? 0 = No  E23. Because of your problem, are you depressed?  0 = No  F24. Does your problem interfere with your job or household responsibilities?  2 = Sometimes  P25. Does bending over increase your problem?  4 = Yes  TOTAL 48%    DHI Scoring Instructions  The patient is asked to answer each question as it pertains to dizziness or unsteadiness problems, specifically  considering their condition during the last month. Questions are designed to incorporate functional (F), physical  (P), and emotional (E) impacts on disability.   Scores greater than 10 points should be referred  to balance specialists for further evaluation.   16-34 Points (mild handicap)  36-52 Points (moderate handicap)  54+ Points (severe handicap)  Minimally Detectable Change: 17 points [place] [employer], [place])  VESTIBULAR ASSESSMENT:  GENERAL OBSERVATION: The patient walks into clinic independently.   SYMPTOM BEHAVIOR:  Subjective history: h/o recurring vertigo, h/o multi-canal BPPV  Non-Vestibular symptoms: h/o visual reliance-- sensitive to ceiling fans or busy environments  Type of dizziness: Spinning/Vertigo and Unsteady with head/body turns  Frequency: daily  Duration: seconds to minutes  Aggravating factors: rolling R and L, bending forward, looking up  Relieving factors: head stationary  Progression of symptoms: better  OCULOMOTOR EXAM:  Ocular Alignment: normal  Ocular ROM: No Limitations  Spontaneous Nystagmus: absent  Gaze-Induced Nystagmus: absent  Smooth Pursuits: intact  Saccades: intact and no symptoms of imbalance with quick eye movements   VESTIBULAR - OCULAR REFLEX:   Slow VOR: Normal and Comment: x 10 reps, no dizziness today    POSITIONAL TESTING:  Right Dix-Hallpike: + for upbeating, rotary nystagmus x 30 seconds + nausea + sweating/heat  Left Dix-Hallpike: latency x 12 seconds, then 8 seconds of small amplitude nystagmus + nausea + sweating/heat Right Roll Test: provokes upbeating, R rotary nystagmus  Left Roll Test: None TESTING INDICATES R posterior canalithiasis (more severe) than L posterior canalithiasis   FUNCTIONAL GAIT: TBA as indicated  [facility] Adult PT Treatment:                                                DATE: 09/08/24 Neuromuscular re-ed: POSITIONAL TESTING:  Right Sidelying (to 3 pillows):  + for upbeating, rotary nystagmus x 10 seconds with latency x 5-8 seconds. Left Sidelying:  Negative for nystagmus and dizziness Right horiztonal roll:  Negative for nystagmus-- gets a sensation it may come on, but it settles Left horizontal roll:  Negative for nystagmus and dizziness-- gets trace sensation if could come on Habituation Brandt daroff x 3 reps R and L sides Rolling x 3 reps R and L>> R side provokes  Discussed home performance using extra pillows   [facility] Adult PT Treatment:                                                DATE: 08/24/24 Neuromuscular re-ed: POSITIONAL TESTING:  Right Sidelying: + for upbeating, rotary nystagmus x 15 seconds Left Sidelying:  Negative for nystagmus and dizziness Right horiztonal roll:  Negative for nystagmus and dizziness Left horizontal roll: Negative for nystagmus and dizziness Testing indicates R posterior canaithiasis today We starting moving into Semont maneuver for CRT for R posterior canal and patient had severe vertigo with nausea with horizontal nystagmus (in R sidelying, it is R geotropic) After CRT re-tested and found initial upbeating, rotary nsytagmus for R posterior BPPV. When those nystagmus settled, she got geotropic nystagmus that are severe. We had to end session due to nausea-- ice pack helps reduce heat/nausea Canolith Repositioning: Horizontal barbeque roll R side At end of barbeque roll, when transitioning back to the R side, she gets upbeating, right rotary nystagmus that lasts x seconds   [facility] Adult PT Treatment:                                                DATE: 08/15/24 Neuromuscular re-ed: POSITIONAL TESTING:  Right Dix-Hallpike: negative today Left Dix-Hallpike: latency x 8 seconds, then 15 seconds of upbeating, rotary L nystagmus Testing indicates initial L posterior canalithiasis After CRT re-tested and R horizontal roll=no nystagmus, L horizontal roll=geotropic nystagmus Self: Discussed home self treatment and self epley's Patient's sister present today to learn home care and how to assist patient Canolith Repositioning: L epley's maneuver -- when rolling onto R shoulder, she got horizontal nystagmus indicating transition to horizontal  canalithiasis Began L horizontal canolith repositioning and patient had to stop maneuver due to  nausea and vomitting  [facility] Adult PT Treatment:                                                DATE: 08/11/24 Canolith repositioning: R Epley's maneuver Did not retest due to patient nausea and needing to rest after maneuver  PATIENT EDUCATION: Education details: nature of symptoms, home treatment Person educated: Patient Education method: Explanation Education comprehension: verbalized understanding  HOME EXERCISE PROGRAM: Access Code: [email or web address] URL: [email or web address] Date: 09/08/2024 Prepared by: Tawni Ferrier  Exercises - Rolling rightleft sides for vestibular habituation  - 2 x daily - 7 x weekly - 1 sets - 5 reps - Brandt-Daroff Vestibular Exercise  - 2 x daily - 7 x weekly - 1 sets - 5 reps  GOALS: Goals reviewed with patient? Yes  SHORT TERM GOALS: Target date: 09/10/24  The patient will be indep with initial HEP Baseline: initiated at eval Goal status: MET  2..   The patient will report no dizziness with bed mobility.  Baseline: Dizziness with rolling Goal status: ONGOING  LONG TERM GOALS: Target date: 10/11/24  The patient will be indep with progression of HEP. Baseline:  initiated at eval Goal status: INITIAL  2.  The patient will improve DHI by 15% to demonstrate improved functional abilities. Baseline: 48% Goal status: INITIAL  3.  The patient will have negative positional testing indicating resolution of BPPV. Baseline:  see above Goal status: INITIAL  4.  The patient will report self mgmt of BPPV due to chronic h/o vertigo. Baseline:   has h/o episodic vertigo Goal status: INITIAL  ASSESSMENT:  CLINICAL IMPRESSION: The patient and PT tried a different approach today. Instead of canolith repositioning, we did habituation with modifications to reduce severity of symptoms. The goal of this will be to reduce sensitivity/response to the  vertigo in hopes she can tolerate more movements and eventually further Canolith repositioning. PT to f/u in 7-10 days to check progress on home habituation.   EVAL: Patient is a 69 y.o. female who was seen today for physical therapy evaluation and treatment for vertigo. She presents today with positive R dix hallpike and positive L dix hallpike. PT treated R canalithiasis today due to more severe symptoms to R side. Plan to re-test next session and treat, and also instruct her son in home treatment with Epley's maneuver. PT to address deficits to return to baseline level of function.  OBJECTIVE IMPAIRMENTS: decreased activity tolerance, decreased balance, dizziness, and impaired vision/preception.   PLAN:  PT FREQUENCY: 1-2x/week  PT DURATION: 8 weeks  PLANNED INTERVENTIONS: 97164- PT Re-evaluation, 97750- Physical Performance Testing, 97110-Therapeutic exercises, 97530- Therapeutic activity, [id number]- Neuromuscular re-education, 97535- Self Care, [id number]- Manual therapy, [phone]- Gait training, [phone]- Canalith repositioning, Patient/Family education, Balance training, and Visual/preceptual remediation/compensation  PLAN FOR NEXT SESSION:  Plan to recheck for positional vertigo. Discuss home management and habituation if needed.    Reesha Debes, PT 09/08/2024, 2:11 PM  "

## 2024-09-09 ENCOUNTER — Other Ambulatory Visit: Payer: Self-pay | Admitting: Family Medicine

## 2024-09-11 ENCOUNTER — Ambulatory Visit: Payer: Self-pay | Admitting: Family Medicine

## 2024-09-12 ENCOUNTER — Other Ambulatory Visit: Payer: Self-pay | Admitting: Family Medicine

## 2024-09-16 ENCOUNTER — Ambulatory Visit: Admitting: Rehabilitative and Restorative Service Providers"

## 2024-09-16 DIAGNOSIS — H8112 Benign paroxysmal vertigo, left ear: Secondary | ICD-10-CM

## 2024-09-16 DIAGNOSIS — R42 Dizziness and giddiness: Secondary | ICD-10-CM

## 2024-09-16 DIAGNOSIS — H8111 Benign paroxysmal vertigo, right ear: Secondary | ICD-10-CM

## 2024-09-16 NOTE — Therapy (Signed)
 " OUTPATIENT PHYSICAL THERAPY VESTIBULAR TREATMENT   Patient Name: Rhonda Munoz MRN: [id number] DOB:[birth year], 69 y.o., female Today's Date: 09/16/2024  END OF SESSION:  PT End of Session - 09/16/24 1023     Visit Number 5    Number of Visits 12    Date for Recertification  10/10/24    Authorization Type MEDICARE    PT Start Time 1020    PT Stop Time 1100    PT Time Calculation (min) 40 min    Activity Tolerance Other (comment)   nausea   Behavior During Therapy WFL for tasks assessed/performed           Past Medical History:  Diagnosis Date   Allergy    Cataract    Surgery   Depression    GERD (gastroesophageal reflux disease)    Hemorrhoids    Hyperlipidemia    Past Surgical History:  Procedure Laterality Date   ABDOMINAL HYSTERECTOMY     APPENDECTOMY  02/10/2024   CATARACT EXTRACTION, BILATERAL Bilateral 08/2022   second one 09/2022   CHOLECYSTECTOMY     EYE SURGERY  2024   Cataract   FINGER SURGERY Right    index finger x 1, middle finger x 2   GALLBLADDER SURGERY     JOINT REPLACEMENT     DIP JOINT FUSION   LAPAROSCOPIC APPENDECTOMY N/A 02/10/2024   Procedure: APPENDECTOMY, LAPAROSCOPIC;  Surgeon: Signe Mitzie LABOR, MD;  Location: [facility];  Service: General;  Laterality: N/A;   TONSILLECTOMY     TUBAL LIGATION     Patient Active Problem List   Diagnosis Date Noted   Acute maxillary sinusitis 06/13/2024   Perforated appendicitis 02/12/2024   Acute appendicitis 02/10/2024   Lymphadenopathy 08/19/2023   Vulvovaginal candidiasis 07/27/2023   Thrush 07/27/2023   Cough 07/27/2023   Benign paroxysmal positional vertigo 01/04/2023   Yeast dermatitis 07/02/2022   Depression 06/05/2021   Osteopenia 04/26/2020   Hypercalcemia 01/02/2020   Hyperlipemia 01/02/2020   Prediabetes 01/02/2020   Postmenopausal 12/13/2019   Knee pain, left 09/28/2017   Gastroesophageal reflux disease with esophagitis 05/18/2017   History of colon polyps 05/18/2017     PCP: Velma Ku, DO REFERRING PROVIDER: Velma Ku, DO REFERRING DIAG: R42 (ICD-10-CM) - Vertigo   THERAPY DIAG:  No diagnosis found.  ONSET DATE: 03/22/24  Rationale for Evaluation and Treatment: Rehabilitation  SUBJECTIVE:   SUBJECTIVE STATEMENT: The patient reports that she thinks she is going to have to live with this--noting some dizziness remains. She did the exercises and notes rolling to the right or moving from sitting to R sidelying provokes it. She also noticed looking up to hang a clock provoked her symptoms. Overall, she feels a reduction in symptoms, but not resolution.  Pt accompanied by: self  PERTINENT HISTORY: recent appendectomy, hyperlipidemia, h/o vertigo.   PAIN:  Are you having pain? No  PRECAUTIONS: None  WEIGHT BEARING RESTRICTIONS: No  FALLS: Has patient fallen in last 6 months? No  LIVING ENVIRONMENT: Lives with: lives alone Lives in: House/apartment  PLOF: Independent  PATIENT GOALS: reduce dizziness  OBJECTIVE:  Note: Objective measures were completed at Evaluation unless otherwise noted. COGNITION: Overall cognitive status: Within functional limits for tasks assessed   SENSATION: WFL  POSTURE:  No Significant postural limitations  Cervical ROM:  WFLs  GAIT: Gait pattern: WFL Distance walked: 75 feet  PATIENT SURVEYS:  DHI: THE DIZZINESS HANDICAP INVENTORY (DHI)   At [facility] 09/16/24  P1. Does looking up increase your problem?  4 = Yes 4=Yes  E2. Because of your problem, do you feel frustrated? 4 = Yes 4=Yes  F3. Because of your problem, do you restrict your travel for business or recreation?  0 = No 0=No  P4. Does walking down the aisle of a supermarket increase your problems?  4 = Yes 4=Yes  F5. Because of your problem, do you have difficulty getting into or out of bed?  4 = Yes 4=Yes  F6. Does your problem significantly restrict your participation in social activities, such as going out to dinner, going to the movies,  dancing, or going to parties? 4 = Yes 0=No  F7. Because of your problem, do you have difficulty reading?  4 = Yes 2=Sometimes  P8. Does performing more ambitious activities such as sports, dancing, household chores (sweeping or putting dishes away) increase your problems?  2 = Sometimes 2=Sometimes  E9. Because of your problem, are you afraid to leave your home without having without having someone accompany you?  0 = No 0=No  E10. Because of your problem have you been embarrassed in front of others?  0 = No 0=No  P11. Do quick movements of your head increase your problem?  4 = Yes 4=Yes  F12. Because of your problem, do you avoid heights?  4 = Yes 4=Yes  P13. Does turning over in bed increase your problem?  4 = Yes 4=Yes  F14. Because of your problem, is it difficult for you to do strenuous homework or yard work? 2 = Sometimes 2=Sometimes  E15. Because of your problem, are you afraid people may think you are intoxicated? 0 = No 0=No  F16. Because of your problem, is it difficult for you to go for a walk by yourself?  0 = No 0=No  P17. Does walking down a sidewalk increase your problem?  0 = No 0=No  E18.Because of your problem, is it difficult for you to concentrate 0 = No 0=No  F19. Because of your problem, is it difficult for you to walk around your house in the dark? 2 = Sometimes 2=Sometimes  E20. Because of your problem, are you afraid to stay home alone?  0 = No 0=No  E21. Because of your problem, do you feel handicapped? 0 = No 0+no  E22. Has the problem placed stress on your relationships with members of your family or friends? 0 = No 0=No  E23. Because of your problem, are you depressed?  0 = No 0=No  F24. Does your problem interfere with your job or household responsibilities?  2 = Sometimes 2=Sometimes  P25. Does bending over increase your problem?  4 = Yes 4=Yes  TOTAL 48% 42%    DHI Scoring Instructions  The patient is asked to answer each question as it pertains to dizziness or  unsteadiness problems, specifically  considering their condition during the last month. Questions are designed to incorporate functional (F), physical  (P), and emotional (E) impacts on disability.   Scores greater than 10 points should be referred to balance specialists for further evaluation.   16-34 Points (mild handicap)  36-52 Points (moderate handicap)  54+ Points (severe handicap)  Minimally Detectable Change: 17 points [place] [employer], [place])  VESTIBULAR ASSESSMENT:  GENERAL OBSERVATION: The patient walks into clinic independently.   SYMPTOM BEHAVIOR:  Subjective history: h/o recurring vertigo, h/o multi-canal BPPV  Non-Vestibular symptoms: h/o visual reliance-- sensitive to ceiling fans or busy environments  Type of dizziness: Spinning/Vertigo and Unsteady with head/body turns  Frequency: daily  Duration: seconds to minutes  Aggravating factors: rolling R and L, bending forward, looking up  Relieving factors: head stationary  Progression of symptoms: better  OCULOMOTOR EXAM:  Ocular Alignment: normal  Ocular ROM: No Limitations  Spontaneous Nystagmus: absent  Gaze-Induced Nystagmus: absent  Smooth Pursuits: intact  Saccades: intact and no symptoms of imbalance with quick eye movements   VESTIBULAR - OCULAR REFLEX:   Slow VOR: Normal and Comment: x 10 reps, no dizziness today    POSITIONAL TESTING:  Right Dix-Hallpike: + for upbeating, rotary nystagmus x 30 seconds + nausea + sweating/heat  Left Dix-Hallpike: latency x 12 seconds, then 8 seconds of small amplitude nystagmus + nausea + sweating/heat Right Roll Test: provokes upbeating, R rotary nystagmus  Left Roll Test: None TESTING INDICATES R posterior canalithiasis (more severe) than L posterior canalithiasis   FUNCTIONAL GAIT: TBA as indicated  [facility] Adult PT Treatment:                                                DATE: 09/16/24 Neuromuscular re-ed: DHI=42 points (Moderate range) POSITIONAL  TESTING: Sit>R sidelying (2 pillows) + for upbeating rotary nystagmus x 15 seconds. 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 She opts to do habituation for home Self Care: Discussed home plan and therapy approach Described anatomy and discussed how habituation works She reports she is still avoiding laying down on her right side She is learning to compensate for multiple episodes, which she now reports using hand held shower head to avoid tilting her head She touches the wall in the shower She notes she doesn't have good balance going up and down steps -- she has to hold the rail (up is easier than down) Library is most challenging with book titles   [facility] Adult PT Treatment:                                                DATE: 09/08/24 Neuromuscular re-ed: POSITIONAL TESTING:  Right Sidelying (to 3 pillows):  + for upbeating, rotary nystagmus x 10 seconds with latency x 5-8 seconds. Left Sidelying:  Negative for nystagmus and dizziness Right horiztonal roll:  Negative for nystagmus-- gets a sensation it may come on, but it settles Left horizontal roll: Negative for nystagmus and dizziness-- gets trace sensation if could come on Habituation Brandt daroff x 3 reps R and L sides Rolling x 3 reps R and L>> R side provokes  Discussed home performance using extra pillows   [facility] Adult PT Treatment:                                                DATE: 08/24/24 Neuromuscular re-ed: POSITIONAL TESTING:  Right Sidelying: + for upbeating, rotary nystagmus x 15 seconds Left Sidelying:  Negative for nystagmus and dizziness Right horiztonal roll:  Negative for  nystagmus and dizziness Left horizontal roll: Negative for  nystagmus and dizziness Testing indicates R posterior canaithiasis today We starting moving into Semont maneuver for CRT for R posterior canal and patient had severe vertigo with nausea with horizontal nystagmus (in R sidelying, it is R geotropic) After CRT re-tested and found initial upbeating, rotary nsytagmus for R posterior BPPV. When those nystagmus settled, she got geotropic nystagmus that are severe. We had to end session due to nausea-- ice pack helps reduce heat/nausea Canolith Repositioning: Horizontal barbeque roll R side At end of barbeque roll, when transitioning back to the R side, she gets upbeating, right rotary nystagmus that lasts x seconds   [facility] Adult PT Treatment:                                                DATE: 08/15/24 Neuromuscular re-ed: POSITIONAL TESTING:  Right Dix-Hallpike: negative today Left Dix-Hallpike: latency x 8 seconds, then 15 seconds of upbeating, rotary L nystagmus Testing indicates initial L posterior canalithiasis After CRT re-tested and R horizontal roll=no nystagmus, L horizontal roll=geotropic nystagmus Self: Discussed home self treatment and self epley's Patient's sister present today to learn home care and how to assist patient Canolith Repositioning: L epley's maneuver -- when rolling onto R shoulder, she got horizontal nystagmus indicating transition to horizontal canalithiasis Began L horizontal canolith repositioning and patient had to stop maneuver due to nausea and vomitting  [facility] Adult PT Treatment:                                                DATE: 08/11/24 Canolith repositioning: R Epley's maneuver Did not retest due to patient nausea and needing to rest after maneuver  PATIENT EDUCATION: Education details: nature of symptoms, home treatment Person educated: Patient Education method: Explanation Education comprehension: verbalized understanding  HOME EXERCISE PROGRAM: Access Code: [email or web address] URL:  [email or web address] Date: 09/08/2024 Prepared by: Tawni Ferrier  Exercises - Rolling rightleft sides for vestibular habituation  - 2 x daily - 7 x weekly - 1 sets - 5 reps - Brandt-Daroff Vestibular Exercise  - 2 x daily - 7 x weekly - 1 sets - 5 reps  GOALS: Goals reviewed with patient? Yes  SHORT TERM GOALS: Target date: 09/10/24  The patient will be indep with initial HEP Baseline: initiated at eval Goal status: MET  2..   The patient will report no dizziness with bed mobility.  Baseline: Dizziness with rolling Goal status: ONGOING  LONG TERM GOALS: Target date: 10/11/24  The patient will be indep with progression of HEP. Baseline:  initiated at eval Goal status: INITIAL  2.  The patient will improve DHI by 15% to demonstrate improved functional abilities. Baseline: 48% Goal status: INITIAL  3.  The patient will have negative positional testing indicating resolution of BPPV. Baseline:  see above Goal status: INITIAL  4.  The patient will report self mgmt of BPPV due to chronic h/o vertigo. Baseline:   has h/o episodic vertigo Goal status: INITIAL  ASSESSMENT:  CLINICAL IMPRESSION: The patient's episode of physical therapy began with positive Dix hallpike on the R and the L at initial eval on 08/11/24. The R posterior canal cleared after eval with Epley's maneuver. At 2nd session,  we did Epley's for L BPPV and otoconia repositioned into the L horizontal canal per + roll test with geotropic nystagmus.Symptoms worsened leading to nausea/vomitting. At 3rd session, she presented with return of R posterior canal BPPV (horizontal canals initially cleared) and during a semont maneuver (chosen since L epley's repositioned otoconia into horizontal canal), she began with geotropic nystagmus worse with R rolling. *Due to multi-canal vertigo and h/o visual reliance for balance, sent back to MD to r/o atypical migraine or other conditions. She has seen ENT and had a  normal MRI. She currently has R posterior canal BPPV and other canals are cleared. She does not want to undergo canolith repositioning at this time and chooses to continue with habituation for the R BPPV.  She is getting a full audiologic workup with [facility] in March. She has a long h/o intermittent vertigo with the initial episode sounding more consistent with neuritis and more recent episodes more consistent with positional symptoms. She also has h/o visual reliance making descending steps and walking through [employer] provoking for a sensation of imbalance. She denies any migraine history, but has had severe multi-canal BPPV in therapy.  EVAL: Patient is a 69 y.o. female who was seen today for physical therapy evaluation and treatment for vertigo. She presents today with positive R dix hallpike and positive L dix hallpike. PT treated R canalithiasis today due to more severe symptoms to R side. Plan to re-test next session and treat, and also instruct her son in home treatment with Epley's maneuver. PT to address deficits to return to baseline level of function.  OBJECTIVE IMPAIRMENTS: decreased activity tolerance, decreased balance, dizziness, and impaired vision/preception.   PLAN:  PT FREQUENCY: 1-2x/week  PT DURATION: 8 weeks  PLANNED INTERVENTIONS: 97164- PT Re-evaluation, 97750- Physical Performance Testing, 97110-Therapeutic exercises, 97530- Therapeutic activity, [id number]- Neuromuscular re-education, 97535- Self Care, [id number]- Manual therapy, [phone]- Gait training, [phone]- Canalith repositioning, Patient/Family education, Balance training, and Visual/preceptual remediation/compensation  PLAN FOR NEXT SESSION: on Hold until further assessment-- will need new order to return if > 30 days.    Annalycia Done, PT 09/16/2024, 2:12 PM  "

## 2024-09-22 ENCOUNTER — Ambulatory Visit: Admitting: Family Medicine

## 2024-09-22 ENCOUNTER — Encounter: Payer: Self-pay | Admitting: Family Medicine

## 2024-09-22 VITALS — BP 112/74 | HR 76 | Ht 67.0 in | Wt 199.0 lb

## 2024-09-22 DIAGNOSIS — H8111 Benign paroxysmal vertigo, right ear: Secondary | ICD-10-CM | POA: Diagnosis not present

## 2024-09-22 DIAGNOSIS — F324 Major depressive disorder, single episode, in partial remission: Secondary | ICD-10-CM | POA: Diagnosis not present

## 2024-09-22 DIAGNOSIS — R7303 Prediabetes: Secondary | ICD-10-CM

## 2024-09-22 LAB — POCT GLYCOSYLATED HEMOGLOBIN (HGB A1C): HbA1c, POC (prediabetic range): 6.1 % (ref 5.7–6.4)

## 2024-09-22 NOTE — Assessment & Plan Note (Signed)
 Blood sugars remain pretty well-controlled.  She will continue to work on dietary changes

## 2024-09-22 NOTE — Assessment & Plan Note (Signed)
 She remains on Pristiq.  Will plan to continue.

## 2024-09-22 NOTE — Assessment & Plan Note (Signed)
 She does have appointment scheduled with vestibular testing lab.  For now she will continue to avoid positional changes that trigger her symptoms and use meclizine  as needed.

## 2024-09-22 NOTE — Progress Notes (Signed)
 " Rhonda Munoz - 69 y.o. female MRN [id number]  Date of birth: March 21, 1956  Subjective Chief Complaint  Patient presents with   Prediabetes   Mood    HPI Rhonda Munoz is a 70 y.o. female here today for follow up visit.   She reports that she is doing okay..  She continues to experience dizziness.  Has been doing PT with only intermittent improvement.  MRI brain was unremarkable.  She did see ENT as well.  Referral to vestibular lab at [facility] has been placed.  She has been careful to avoid triggers.  She remains on pristiq  and feels that this is working well for her.    She would like to have updated A1c as this was elevated previously   TeddieROS:  A comprehensive ROS was completed and negative except as noted per HPI  Allergies[1]  Past Medical History:  Diagnosis Date   Allergy    Cataract    Surgery   Depression    GERD (gastroesophageal reflux disease)    Hemorrhoids    Hyperlipidemia     Past Surgical History:  Procedure Laterality Date   ABDOMINAL HYSTERECTOMY     APPENDECTOMY  02/10/2024   CATARACT EXTRACTION, BILATERAL Bilateral 08/2022   second one 09/2022   CHOLECYSTECTOMY     EYE SURGERY  2024   Cataract   FINGER SURGERY Right    index finger x 1, middle finger x 2   GALLBLADDER SURGERY     JOINT REPLACEMENT     DIP JOINT FUSION   LAPAROSCOPIC APPENDECTOMY N/A 02/10/2024   Procedure: APPENDECTOMY, LAPAROSCOPIC;  Surgeon: Signe Mitzie LABOR, MD;  Location: [facility];  Service: General;  Laterality: N/A;   TONSILLECTOMY     TUBAL LIGATION      Social History   Socioeconomic History   Marital status: Widowed    Spouse name: Not on file   Number of children: 2   Years of education: 14   Highest education level: Associate degree: academic program  Occupational History   Occupation: Retired  Tobacco Use   Smoking status: Never   Smokeless tobacco: Never  Vaping Use   Vaping status: Never Used  Substance and Sexual Activity    Alcohol use: Yes    Comment: rarely   Drug use: Never   Sexual activity: Not Currently    Partners: Male    Birth control/protection: Post-menopausal, Surgical  Other Topics Concern   Not on file  Social History Narrative   Lives with her dog. She has two sons. She enjoys reading, social gatherings at church and spending time with her grandchildren. Her sons live in [place] and [place].   Social Drivers of Health   Tobacco Use: Low Risk (09/22/2024)   Patient History    Smoking Tobacco Use: Never    Smokeless Tobacco Use: Never    Passive Exposure: Not on file  Financial Resource Strain: Low Risk (06/07/2024)   Overall Financial Resource Strain (CARDIA)    Difficulty of Paying Living Expenses: Not hard at all  Food Insecurity: No Food Insecurity (06/07/2024)   Epic    Worried About Programme Researcher, Broadcasting/film/video in the Last Year: Never true    Ran Out of Food in the Last Year: Never true  Transportation Needs: No Transportation Needs (06/07/2024)   Epic    Lack of Transportation (Medical): No    Lack of Transportation (Non-Medical): No  Physical Activity: Insufficiently Active (06/07/2024)   Exercise Vital Sign  Days of Exercise per Week: 3 days    Minutes of Exercise per Session: 20 min  Stress: No Stress Concern Present (06/07/2024)   [employer] of Occupational Health - Occupational Stress Questionnaire    Feeling of Stress: Only a little  Social Connections: Moderately Integrated (06/07/2024)   Social Connection and Isolation Panel    Frequency of Communication with Friends and Family: More than three times a week    Frequency of Social Gatherings with Friends and Family: Twice a week    Attends Religious Services: More than 4 times per year    Active Member of [employer] or Organizations: Yes    Attends Banker Meetings: More than 4 times per year    Marital Status: Widowed  Depression (PHQ2-9): Low Risk (09/22/2024)   Depression (PHQ2-9)    PHQ-2 Score: 0   Alcohol Screen: Low Risk (06/07/2024)   Alcohol Screen    Last Alcohol Screening Score (AUDIT): 0  Housing: Low Risk (06/07/2024)   Epic    Unable to Pay for Housing in the Last Year: No    Number of Times Moved in the Last Year: 0    Homeless in the Last Year: No  Utilities: Not At Risk (06/07/2024)   Epic    Threatened with loss of utilities: No  Health Literacy: Adequate Health Literacy (06/07/2024)   B1300 Health Literacy    Frequency of need for help with medical instructions: Never    Family History  Problem Relation Age of Onset   Breast cancer Mother    Hypertension Mother    Stroke Mother    Colon polyps Mother    Arthritis Mother    Asthma Mother    Cancer Mother    Hearing loss Mother    Miscarriages / Stillbirths Mother    Obesity Mother    Diabetes Father    Heart attack Father    Heart disease Father    Hypertension Father    Hyperlipidemia Father    Arthritis Father    Obesity Father    Varicose Veins Father    Cancer Sister    Colon cancer Sister    Heart failure Maternal Grandmother    Colon cancer Maternal Grandfather    Heart failure Paternal Grandmother    Cancer Sister    Obesity Sister    Esophageal cancer Neg Hx    Rectal cancer Neg Hx    Stomach cancer Neg Hx     Health Maintenance  Topic Date Due   Hepatitis C Screening  Never done   COVID-19 Vaccine (4 - 2025-26 season) 04/24/2025 (Originally 04/25/2024)   DTaP/Tdap/Td (3 - Td or Tdap) 01/12/2025   Medicare Annual Wellness (AWV)  06/07/2025   Bone Density Scan  04/21/2026   Mammogram  08/04/2026   Colonoscopy  05/21/2028   Pneumococcal Vaccine: 50+ Years  Completed   Influenza Vaccine  Completed   Zoster Vaccines- Shingrix  Completed   Meningococcal B Vaccine  Aged Out      ----------------------------------------------------------------------------------------------------------------------------------------------------------------------------------------------------------------- Physical Exam BP 112/74 (BP Location: Left Arm, Patient Position: Sitting, Cuff Size: Large)   Pulse 76   Ht 5' 7 (1.702 m)   Wt 199 lb (90.3 kg)   SpO2 99%   BMI 31.17 kg/m   Physical Exam Constitutional:      Appearance: Normal appearance.  Eyes:     General: No scleral icterus. Cardiovascular:     Rate and Rhythm: Normal rate and regular rhythm.  Pulmonary:  Effort: Pulmonary effort is normal.     Breath sounds: Normal breath sounds.  Neurological:     Mental Status: She is alert.  Psychiatric:        Mood and Affect: Mood normal.        Behavior: Behavior normal.     ------------------------------------------------------------------------------------------------------------------------------------------------------------------------------------------------------------------- Assessment and Plan  Prediabetes Blood sugars remain pretty well-controlled.  She will continue to work on dietary changes  Depression She remains on Pristiq .  Will plan to continue.    Benign paroxysmal positional vertigo She does have appointment scheduled with vestibular testing lab.  For now she will continue to avoid positional changes that trigger her symptoms and use meclizine  as needed.   No orders of the defined types were placed in this encounter.   Return in about 4 months (around 01/20/2025) for vertigo.         [1]  Allergies Allergen Reactions   Clenpiq  [Sod Picosulfate-Mag Ox-Cit Acd] Other (See Comments)    Sweating, vomiting and bad headache   Effexor [Venlafaxine] Other (See Comments)    VISUAL HALLUCINATIONS   Codeine Nausea Only and Rash   Hydromorphone Rash    Tachycardia   Procaine Nausea Only and Rash   "

## 2025-01-19 ENCOUNTER — Ambulatory Visit: Admitting: Family Medicine

## 2025-06-13 ENCOUNTER — Ambulatory Visit
